# Patient Record
Sex: Female | Born: 1935
Health system: Southern US, Community
[De-identification: ages and names within clinical notes are randomized; demographics above are authoritative.]

## PROBLEM LIST (undated history)

## (undated) DIAGNOSIS — I6529 Occlusion and stenosis of unspecified carotid artery: Secondary | ICD-10-CM

## (undated) DIAGNOSIS — M1990 Unspecified osteoarthritis, unspecified site: Secondary | ICD-10-CM

## (undated) DIAGNOSIS — I639 Cerebral infarction, unspecified: Secondary | ICD-10-CM

## (undated) DIAGNOSIS — E785 Hyperlipidemia, unspecified: Secondary | ICD-10-CM

## (undated) DIAGNOSIS — I10 Essential (primary) hypertension: Secondary | ICD-10-CM

## (undated) DIAGNOSIS — M40204 Unspecified kyphosis, thoracic region: Secondary | ICD-10-CM

## (undated) DIAGNOSIS — F32A Depression, unspecified: Secondary | ICD-10-CM

## (undated) DIAGNOSIS — K219 Gastro-esophageal reflux disease without esophagitis: Secondary | ICD-10-CM

## (undated) DIAGNOSIS — F329 Major depressive disorder, single episode, unspecified: Secondary | ICD-10-CM

## (undated) HISTORY — DX: Gastro-esophageal reflux disease without esophagitis: K21.9

## (undated) HISTORY — DX: Unspecified osteoarthritis, unspecified site: M19.90

## (undated) HISTORY — DX: Essential (primary) hypertension: I10

## (undated) HISTORY — PX: CATARACT EXTRACTION W/ INTRAOCULAR LENS  IMPLANT, BILATERAL: SHX1307

## (undated) HISTORY — DX: Occlusion and stenosis of unspecified carotid artery: I65.29

## (undated) HISTORY — DX: Unspecified kyphosis, thoracic region: M40.204

## (undated) HISTORY — DX: Cerebral infarction, unspecified: I63.9

## (undated) HISTORY — DX: Major depressive disorder, single episode, unspecified: F32.9

## (undated) HISTORY — DX: Hyperlipidemia, unspecified: E78.5

## (undated) HISTORY — DX: Depression, unspecified: F32.A

---

## 2001-02-12 ENCOUNTER — Other Ambulatory Visit: Admission: RE | Admit: 2001-02-12 | Discharge: 2001-02-12 | Payer: Self-pay | Admitting: Family Medicine

## 2001-08-24 ENCOUNTER — Encounter: Payer: Self-pay | Admitting: Family Medicine

## 2001-08-24 ENCOUNTER — Encounter: Admission: RE | Admit: 2001-08-24 | Discharge: 2001-08-24 | Payer: Self-pay | Admitting: Family Medicine

## 2003-07-15 DIAGNOSIS — I6529 Occlusion and stenosis of unspecified carotid artery: Secondary | ICD-10-CM

## 2003-07-15 DIAGNOSIS — I639 Cerebral infarction, unspecified: Secondary | ICD-10-CM

## 2003-07-15 HISTORY — DX: Cerebral infarction, unspecified: I63.9

## 2003-07-15 HISTORY — DX: Occlusion and stenosis of unspecified carotid artery: I65.29

## 2003-08-08 ENCOUNTER — Encounter: Payer: Self-pay | Admitting: Emergency Medicine

## 2003-08-09 ENCOUNTER — Encounter (INDEPENDENT_AMBULATORY_CARE_PROVIDER_SITE_OTHER): Payer: Self-pay | Admitting: *Deleted

## 2003-08-09 ENCOUNTER — Inpatient Hospital Stay (HOSPITAL_COMMUNITY): Admission: EM | Admit: 2003-08-09 | Discharge: 2003-08-14 | Payer: Self-pay | Admitting: Internal Medicine

## 2005-05-29 ENCOUNTER — Other Ambulatory Visit: Admission: RE | Admit: 2005-05-29 | Discharge: 2005-05-29 | Payer: Self-pay | Admitting: Family Medicine

## 2005-06-25 ENCOUNTER — Encounter: Admission: RE | Admit: 2005-06-25 | Discharge: 2005-06-25 | Payer: Self-pay | Admitting: Family Medicine

## 2006-06-27 ENCOUNTER — Encounter: Admission: RE | Admit: 2006-06-27 | Discharge: 2006-06-27 | Payer: Self-pay | Admitting: Family Medicine

## 2006-09-17 ENCOUNTER — Ambulatory Visit: Payer: Self-pay | Admitting: Vascular Surgery

## 2007-01-07 ENCOUNTER — Encounter: Admission: RE | Admit: 2007-01-07 | Discharge: 2007-01-07 | Payer: Self-pay | Admitting: Family Medicine

## 2007-06-29 ENCOUNTER — Encounter: Admission: RE | Admit: 2007-06-29 | Discharge: 2007-06-29 | Payer: Self-pay | Admitting: Family Medicine

## 2007-09-23 ENCOUNTER — Ambulatory Visit: Payer: Self-pay | Admitting: Vascular Surgery

## 2008-06-29 ENCOUNTER — Encounter: Admission: RE | Admit: 2008-06-29 | Discharge: 2008-06-29 | Payer: Self-pay | Admitting: Family Medicine

## 2009-04-13 ENCOUNTER — Ambulatory Visit: Payer: Self-pay | Admitting: Vascular Surgery

## 2009-04-13 DIAGNOSIS — M1990 Unspecified osteoarthritis, unspecified site: Secondary | ICD-10-CM

## 2009-04-13 HISTORY — DX: Unspecified osteoarthritis, unspecified site: M19.90

## 2009-06-30 ENCOUNTER — Encounter: Admission: RE | Admit: 2009-06-30 | Discharge: 2009-06-30 | Payer: Self-pay | Admitting: Family Medicine

## 2010-03-28 ENCOUNTER — Other Ambulatory Visit (INDEPENDENT_AMBULATORY_CARE_PROVIDER_SITE_OTHER): Payer: BLUE CROSS/BLUE SHIELD

## 2010-03-28 ENCOUNTER — Ambulatory Visit (INDEPENDENT_AMBULATORY_CARE_PROVIDER_SITE_OTHER): Payer: BLUE CROSS/BLUE SHIELD | Admitting: Vascular Surgery

## 2010-03-28 DIAGNOSIS — I6529 Occlusion and stenosis of unspecified carotid artery: Secondary | ICD-10-CM

## 2010-03-29 NOTE — Assessment & Plan Note (Signed)
OFFICE VISIT  Brianna, Mcintosh DOB:  [birth year]                                       03/28/2010 ZOXWR#:[id number]  I saw patient in the office today for continued follow-up of her carotid disease.  She has a known right internal carotid artery occlusion.  We have been following the left carotid on a yearly basis for this reason. Since she was seen last in our office in March 2011, she has had no history of stroke, TIAs, expressive or receptive aphasia, or amaurosis fugax.  She had a stroke in 2005, which was when her right internal carotid artery occlusion was detected.  Of note, she has been on Coumadin and believes that she was placed on this when she was worked up for her stroke in 2005.  PAST MEDICAL HISTORY:  Significant for hypertension, hyperlipidemia, and a previous stroke, as described above.  She denies any history of diabetes, history of previous myocardial infarction, or history congestive heart failure.  SOCIAL HISTORY:  She is widowed.  She has 3 children.  She does not use tobacco.  REVIEW OF SYSTEMS:  CARDIOVASCULAR:  She has had no chest pain, chest pressure, palpitations or arrhythmias. PULMONARY:  She has had no productive cough, bronchitis, asthma or wheezing. MUSCULOSKELETAL:  She does have a history of arthritis and joint pain.  PHYSICAL EXAMINATION:  This is a pleasant 77 year old woman who appears her stated age.  Blood pressure is 123/68, heart rate is 49, saturation is 96%.  Lungs are clear bilaterally to auscultation without rales, rhonchi, or wheezing.  On cardiovascular exam, I do not detect any carotid bruits.  She has a regular rate and rhythm.  She has palpable femoral pulses with warm, well-perfused feet.  Abdomen is soft and nontender.  Musculoskeletal:  She has no major deformities or cyanosis. Neurologic:  She has no focal weakness or paresthesias.  I have independently interpreted her carotid duplex scan, which  shows that her left carotid is widely patent with no significant stenosis noted.  Left vertebral has normally directed flow.  The right was not studied, as she has a known right internal carotid artery occlusion.  Overall, I have reassured her that she has no evidence of carotid stenosis on the left.  Given the right carotid occlusion, I think we should continue to follow her duplex on a yearly basis.  I have ordered a follow-up carotid duplex scan in 1 year.  I will plan on seeing her back in 2 years unless there has been any change in her duplex next year.  With respect to her Coumadin, I personally feel that it would be safe to discontinue her Coumadin and start her on aspirin therapy.  I am not aware of who actually started her Coumadin initially after a stroke in 2005.  Certainly, there is some risk with continued Coumadin therapy, and I am not aware of any __________.    [name]. [name], M.D. Electronically Signed  CSD/MEDQ  D:  03/28/2010  T:  03/29/2010  Job:  [id number]  cc:   Lupita Raider, M.D.

## 2010-04-04 NOTE — Procedures (Unsigned)
CAROTID DUPLEX EXAM  INDICATION:  Follow up carotid artery disease.  HISTORY: Diabetes:  No. Cardiac:  No. Hypertension:  Yes. Smoking:  No. Previous Surgery:  No. CV History:  Patient is currently asymptomatic. Amaurosis Fugax No, Paresthesias No, Hemiparesis No.                                      RIGHT             LEFT Brachial systolic pressure: Brachial Doppler waveforms: Vertebral direction of flow:                          Antegrade DUPLEX VELOCITIES (cm/sec) CCA peak systolic                                     83 ECA peak systolic                                     98 ICA peak systolic                                     94 ICA end diastolic                                     29 PLAQUE MORPHOLOGY:                                    Heterogenous PLAQUE AMOUNT:                                        Mild-to-moderate PLAQUE LOCATION:                                      ICA, ECA, CCA  IMPRESSION:  Left side, no hemodynamically significant stenosis. Prominent left vertebral.  Study is stable compared to previous.  ___________________________________________ Di Kindle. Edilia Bo, M.D.  OD/MEDQ  D:  03/28/2010  T:  03/28/2010  Job:  [id number]

## 2010-05-29 NOTE — Consult Note (Signed)
NEW PATIENT CONSULTATION   [name], [name]  DOB:  1924/06/12                                       04/13/2009  ZOXWR#:[id number]   REASON FOR VISIT:  Follow up carotid artery occlusive disease.   PRIMARY CARE PHYSICIAN:  Dr. Lucita Ferrara.   The patient is an 77 year old Caucasian female who was last seen by Dr.  Waverly Ferrari on 01/11/2004 for continued followup of her carotid  artery disease.  She has known right internal carotid artery occlusion,  which was found during workup for a stroke in July 2005 when she  presented with dysarthria and facial droop.  She has had no residual  symptoms.  There was no significant left internal carotid artery  stenosis at that time.  We are continuing to follow her with  surveillance carotid Doppler studies to evaluate her left internal  carotid artery.  She continues to be asymptomatic.  She has been on  Coumadin since her stroke.   Her past medical history is significant for CVA in 2005 with known right  internal carotid artery occlusion, hypertension, depression, cataract  surgery, and hyperlipidemia.   Her medications are:  Fish Oil 1000 mg q.h.s., Lipitor 20 mg p.o.  q.h.s., warfarin 2 mg daily except 1-1/2 tablets on Sundays, calcium 600  mg with 400 units of vitamin D daily, metoprolol 100 mg q.a.m.,  clonazepam  5 mg 1/2-tablet p.o. daily, paroxetine 20 mg p.o. daily,  folic acid 1 mg p.o. daily, amlodipine 5 mg p.o. daily.   She is allergic to penicillin, sulfa, and codeine and has a sensitivity  to prednisone.   She denies any premature cardiovascular disease in her family.   Social history reveals that she is widowed with 2 children.  She lives  in [place].  She has never smoked and does not drink alcohol on a  regular basis.   REVIEW OF SYSTEMS:  Please refer to our [facility] encounter form.  This was  negative except with the exception of pain in her right knee with  walking which is due to  degenerative disk disease, which is followed by  Dr. Sherlean Foot.  She is currently getting silicone shots.  Otherwise, she  denies weight changes, chest pain, shortness of breath, blood in the  stools, dysuria, neurologic changes, visual changes, dysarthria,  weakness, bleeding problems, although as mentioned, she is on Coumadin,  or skin rashes.  She does have some stress incontinence and has had  problems with presumably yeast vaginitis.   A carotid duplex in our office today showed 1% to 39% stenosis of her  left internal carotid artery, which was stable from her previous exam.  Her peak internal carotid artery systolic pressure was 82 with an end-  diastolic of 24.  She has left external carotid artery stenosis and,  again, occlusion of her right internal carotid artery.   PHYSICAL EXAMINATION:  Blood pressure is 134/73, heart rate is 52,  respirations 18.  This is a well-nourished, pleasant female who appears  her stated age.  Head is normocephalic, atraumatic.  Pupils are equal,  round, reactive to light and accommodation.  Oral mucosa are pink and  moist.  She does wear glasses.  Sclera nonicteric.  Lung sounds were  clear throughout.  Heart had a regular rate and rhythm.  No murmurs were  auscultated.  No  carotid bruits were auscultated.  She had 2+ radial and  posterior tibial pulses bilaterally.  There is no significant peripheral  edema.  Abdomen is soft, nontender, nondistended.  Musculoskeletal  showed no major deformities.  Neurologic exam showed she is alert and  oriented x4.  Speech was clear.  She moved all extremities 5/5 and  symmetrical.  Skin showed no ulcerations.  No cervical lymphadenopathy  or goiter was appreciated.   Patient continues to do well from a neurologic standpoint.  Given that  her left internal carotid artery stenosis is in the 1% to 39% range  without significant change from 2009, we will plan to continue seeing  her on a yearly basis.  She can call  sooner if needed.  In the meantime,  she is been maintained on Coumadin therapy.   Jerold Coombe, PA   Di Kindle. Edilia Bo, M.D.  Electronically Signed   AWZ/MEDQ  D:  04/13/2009  T:  04/13/2009  Job:  [id number]   cc:   Dr. Lucita Ferrara

## 2010-05-29 NOTE — Procedures (Signed)
CAROTID DUPLEX EXAM   INDICATION:  Follow-up evaluation of known carotid artery disease.   HISTORY:  Diabetes:  No.  Cardiac:  No.  Hypertension:  Yes.  Smoking:  No.  Previous Surgery:  No.  CV History:  Patient had a stroke in July, 2002.  Previous duplex on  September 17, 2006 revealed an occluded right ICA and a 20-39% left ICA  stenosis.  Amaurosis Fugax No, Paresthesias No, Hemiparesis No.                                       RIGHT             LEFT  Brachial systolic pressure:         126               128  Brachial Doppler waveforms:         Triphasic         Triphasic  Vertebral direction of flow:        Antegrade         Antegrade  DUPLEX VELOCITIES (cm/sec)  CCA peak systolic                   58                72  ECA peak systolic                   117               245  ICA peak systolic                   Occluded          91  ICA end diastolic                   Occluded          32  PLAQUE MORPHOLOGY:                  Mixed             Calcified  PLAQUE AMOUNT:                      Severe            Mild  PLAQUE LOCATION:                    Throughout ICA    Proximal ICA   IMPRESSION:  1. Occluded right internal carotid artery.  2. 20-39% left internal carotid artery stenosis.  3. Left external carotid artery stenosis.  4. No significant change from previous study.   ___________________________________________  Janetta Hora Fields, MD   MC/MEDQ  D:  09/23/2007  T:  09/23/2007  Job:  [id number]

## 2010-05-29 NOTE — Procedures (Signed)
CAROTID DUPLEX EXAM   INDICATION:  Follow-up for known coronary artery disease.   HISTORY:  Diabetes:  No  Cardiac:  No  Hypertension:  Yes  Smoking:  No  Previous Surgery:  No  CV History:  Patient has a known right internal carotid artery  occlusion.  Patient had a stroke on July 16, 2003.  Patient is currently  on Coumadin.  Amaurosis Fugax No, Paresthesias No, Hemiparesis No                                       RIGHT             LEFT  Brachial systolic pressure:         138               138  Brachial Doppler waveforms:         Triphasic         Triphasic  Vertebral direction of flow:        Antegrade         Antegrade  DUPLEX VELOCITIES (cm/sec)  CCA peak systolic                   59                70  ECA peak systolic                   113               259  ICA peak systolic                   Occluded          99  ICA end diastolic                   Occluded          37  PLAQUE MORPHOLOGY:                  Mixed             Soft  PLAQUE AMOUNT:                      Severe            Mild  PLAQUE LOCATION:                    ICA               Proximal ICA   IMPRESSION:  Occluded right internal carotid artery.   There is 20-39% left internal carotid artery stenosis.   Left external  carotid artery stenosis.   No significant change since previous study performed on January 01, 2006.   ___________________________________________  Di Kindle. Edilia Bo, M.D.   MC/MEDQ  D:  09/17/2006  T:  09/17/2006  Job:  [id number]

## 2010-05-29 NOTE — Procedures (Signed)
CAROTID DUPLEX EXAM   INDICATION:  Carotid disease.   HISTORY:  Diabetes:  No.  Cardiac:  No.  Hypertension:  Yes.  Smoking:  No.  Previous Surgery:  No.  CV History:  Stroke in 2002, currently asymptomatic.  Amaurosis Fugax No, Paresthesias No, Hemiparesis No.                                       RIGHT             LEFT  Brachial systolic pressure:         142               140  Brachial Doppler waveforms:         Normal            Normal  Vertebral direction of flow:        Antegrade         Antegrade  DUPLEX VELOCITIES (cm/sec)  CCA peak systolic                   46                95  ECA peak systolic                   92                179  ICA peak systolic                   Occluded          82  ICA end diastolic                                     24  PLAQUE MORPHOLOGY:                  Mixed             Mixed  PLAQUE AMOUNT:                      Occlusive         Mild  PLAQUE LOCATION:                    ICA               ICA/ECA   IMPRESSION:  1. Known occlusion of the right internal carotid artery.  2. 1-39% stenosis of the left internal carotid artery.  3. Left external carotid artery stenosis noted.  4. No significant change noted when compared to the previous      examination of 09/23/2007.   ___________________________________________  Di Kindle. Edilia Bo, M.D.   CH/MEDQ  D:  04/13/2009  T:  04/14/2009  Job:  [id number]

## 2010-06-01 NOTE — Consult Note (Signed)
NAME:  [name], [name] NO.:  [id number]   MEDICAL RECORD NO.:  [id number]                   PATIENT TYPE:  EMS   LOCATION:  ED                                   FACILITY:  [facility]   PHYSICIAN:  Marlan Mcintosh, M.D.               DATE OF BIRTH:  1924-07-31   DATE OF CONSULTATION:  08/08/2003  DATE OF DISCHARGE:                                   CONSULTATION   HISTORY OF PRESENT ILLNESS:  [name] is a 77 year old, right-  handed, white female, born 1924/01/19, with a history of  hypertension and depression.  The patient has been treated with aspirin in  the past.  The patient comes to [facility] for evaluation of  problems with onset of fatigue yesterday afternoon with some slurred speech  noted by family members at around 9:30 yesterday evening.  The patient  continued to have slurred speech today, was also noted to have some left  facial droop, and was brought to the emergency room today for further  evaluation.  CT scan of the brain has been done, showing evidence of  subcortical bifrontal white matter changes, questionably acute.  The changes  are more prominent on the right than the left.  Neurology was asked to see  the patient for further evaluation.  The patient denies any numbness of the  extremities, gait disturbance, headache.   PAST MEDICAL HISTORY:  1. New onset of dysarthria, left facial droop as above.  2. Hypertension.  3. Hyperlipidemia.  4. Cataract surgery bilaterally.  5. History of depression.   CURRENT MEDICATIONS:  1. Paxil 20 mg daily.  2. Lopressor 100 mg daily.  3. Klonopin 0.5 mg 1/2 tab b.i.d.  4. Lipitor 10 mg daily.  5. Aspirin 81 mg daily.  6. ___________ 10 mg daily.  7. Vantin 200 mg 1 b.i.d. that was recently started for nasal congestion.   HABITS:  The patient dips snuff, does not smoke cigarettes, does not drink  alcohol.   ALLERGIES:  1. PENICILLIN.  2. SULFA DRUGS.  3.  CODEINE.  4. PREDNISONE.   SOCIAL HISTORY:  [place],  is widowed, has three children, one who died with cancer.  The patient  continues to work in [employer] at this time.   FAMILY MEDICAL HISTORY:  Mother died of an accidental death.  Father died  with an MI.  The patient has 7 brothers, sisters.  One brother died with an  MI.  One sister died with senile dementia of Alzheimer's type.  One sister  died with heart disease.  One sister may have had congestive heart failure.   REVIEW OF SYSTEMS:  Notable for no fevers, chills.  Patient does note some  shortness of breath, denies chest pain, neck pain, denies trouble  controlling bowels, bladder, nausea, vomiting, has not had any black-out  seizure events.   PHYSICAL EXAMINATION:  VITAL SIGNS:  Blood pressure 156/94, heart rate 76,  respiratory rate 20, temperature 99.7.  GENERAL:  This patient is a minimally to moderately obese white female, who  is alert, cooperative at the time of examination.  HEENT:  Head is atraumatic.  Eyes:  Pupils are equal, round, and reactive to  light.  Discs are flat bilaterally.  NECK:  Supple.  No carotid bruits noted.  RESPIRATORY:  Clear.  CARDIOVASCULAR:  Regular rate and rhythm, no obvious murmurs or rubs noted.  EXTREMITIES:  Without significant edema.  Trace edema at ankles is noted,  however.  NEUROLOGIC:  Cranial nerves as above.  Some slight left nasolabial fold  flattening is noted.  The patient has symmetric pinprick sensation of the  face, has full extraocular movements.  Visual fields are full.  Speech is  not aphasic, is minimally dysarthric.  Motor testing was 5/5 strength in all  fours.  Good symmetric motor and tone is noted throughout.  Sensory testing  is intact to pinprick, soft touch bilaterally, first sensation throughout.  The patient has good finger-to-nose-to-finger, toe-to-finger bilaterally.  Gait was not tested.  Deep tendon  reflexes symmetric throughout.  Toes are  neutral bilaterally.  No drift is seen.   LABORATORY VALUES:  Notable for white count of 6.5, hemoglobin 13.0,  hematocrit 37.8, MCV 96.1, platelets 210.  Sodium 140, potassium 4.4,  chloride 106, CO2 29, glucose 116, BUN 14, creatinine 1.2, calcium 9.1.  Urinalysis reveals specific gravity of 1.01356.5, negative protein, 3-6  white cells, 0-2 red cells.   CT of the head is as above.  EKG is pending.   IMPRESSION:  1. New onset of dysarthria, left facial droop.  2. Hypertension.   This patient does have some bifrontal white matter changes seen by CT scan,  right greater than left.  The patient likely sustained a small subcortical  cerebrovascular infarction, involving the right brain.  The patient has been  on low dose aspirin prior to this admission.   PLAN:  1. We recommend admission for further stroke evaluation.  2. MRI of the brain.  3. MRI angiogram of intracranial and extracranial vessels.  4. 2 D echocardiogram.  5. Continue aspirin for now, may decide to switch to Aggrenox depending on     the results of the above.  6. Would recommend a monitored bed during the admission.  Will follow the     patient's clinical course while in-house.                                               Marlan Mcintosh, M.D.    CKW/MEDQ  D:  08/08/2003  T:  08/08/2003  Job:  [id number]   cc:   Melissa L. Ladona Ridgel, MD  [address] [place], [place] [address]  Fax: [phone]

## 2010-06-01 NOTE — H&P (Signed)
NAMENOELANI, Mcintosh                        ACCOUNT NO.:  [id number]   MEDICAL RECORD NO.:  [id number]                   PATIENT TYPE:  EMS   LOCATION:  ED                                   FACILITY:  [facility]   PHYSICIAN:  Hollice Espy, M.D.            DATE OF BIRTH:  1924/11/28   DATE OF ADMISSION:  08/08/2003  DATE OF DISCHARGE:                                HISTORY & PHYSICAL   PRIMARY CARE PHYSICIAN:  Dr. Donia Guiles.   CONSULTANTS ON THIS CASE:  Dr. Anne Hahn of neurology.   CHIEF COMPLAINT:  Slurred speech and facial droop.   HISTORY OF PRESENT ILLNESS:  This is a 77 year old white female with a past  medical history of hypertension and hyperlipidemia who presents with  symptoms suspicious for an acute stroke.  The patient states that she is in  relatively good health with minimal complaints when last night her family  noted that the patient was starting to have some slurred speech and her face  was drooping on the left-hand side.  They became concerned.  The patient  went to bed, she woke up the next morning and her symptoms persisted.  The  family convinced her to come into the emergency room for further evaluation.  On ER exam the patient had a noted left facial droop and again according to  her family, had a slurred speech.  She did not experience any complaints of  confusion or weakness, nor did this present on any type of physical exam.  A  CT scan of the head was done which did not show any evidence of acute bleed,  but could not definitively rule out a right-sided CVA.  The patient's  symptoms remained the same, not improving or worsening.  Neurology, Dr.  Anne Hahn of the Stroke Service was consulted, evaluated the patient and felt  the patient did have a possible CVA.  He recommended the patient be admitted  for further workup including MRI./MRA, and 2D echocardiogram.  Currently the  patient states she is doing okay.  She denies any headaches or visual  changes.  She denies any dysphagia.  She denies any chest pain,  palpitations, shortness of breath, wheeze, cough, abdominal pain, hematuria,  dysuria, constipation, or diarrhea.  She denies any focal extremity  weakness.  She does state that she can feel that her speech is slightly off,  but her thought process appears to be normal.   PAST MEDICAL HISTORY:  1. Hypertension.  2. Hyperlipidemia.  3. History of bilateral cataract surgery.  4. History of depression.   MEDICATIONS:  1. Paxil 20 mg daily.  2. Lopressor 100 mg daily.  3. Klonopin 0.25 mg b.i.d.  4. Lipitor 10 mg daily.  5. Aspirin 81 mg daily.  6. Zyrtec 10 mg daily.   ALLERGIES:  THE PATIENT HAS AN ALLERGY TO PENICILLIN, CODEINE AND SULFA.  SHE ALSO STATES THAT PREDNISONE GIVES HER IRREGULAR  HEART BEAT.   SOCIAL HISTORY:  She denies any alcohol or drug use.  She denies any current  tobacco use.   FAMILY HISTORY:  Hypertension and heart disease.   PHYSICAL EXAMINATION:  VITAL SIGNS:  On admission temperature 99.7, heart  rate 76, blood pressure 156/94, respirations 20, O2 saturation is  95% on  room air.  GENERAL:  The patient is alert and oriented times three in no apparent  distress.  HEENT:  She was noted to have a left facial droop, but otherwise her cranial  nerves are normal.  NECK:  No carotid bruits.  HEART:  Regular rate and rhythm.  S1 and S2.  LUNGS:  Clear to auscultation bilaterally.  ABDOMEN:  Soft, nontender, nondistended, positive bowel sounds.  EXTREMITIES:  No clubbing, cyanosis, or edema.  She is noted to have normal  bilateral equal grip and flexion and extension both upper and lower  extremities approximately 5 to 5+, but no focal weakness on one side or the  other.  She has some decreased sensation in her lower extremities, but this  may be more subjective.  She has no evidence of any Babinski sign.  She has  full range of motion of all of her extremities.  She has no pronator drift.  No  evidence of any cerebellar dysfunction.   LABORATORY DATA:  White count is 6.5, H&H 13 and 38, MCV 96, platelet count  210.  She has no shift.  Sodium 140, potassium 4.4, chloride 106, bicarb 29,  BUN 14, creatinine 1.2, glucose 116, calcium 9.1.  Her urinalysis is noted  to have a small amount of hemoglobin and large amount of leukocyte esterase,  but few bacteria, 3 to 6 white cells, and many epithelial cells.  CT of the  head shows some chronic small vessel changes, no evidence of any acute  bleed, but cannot rule out a right CVA.   ASSESSMENT/PLAN:  1. Suspect acute cerebrovascular accident.  The patient with continued     symptoms from approximately 24 hours now of facial droop and slurred     speech.  Neurology, Dr. Anne Hahn from the Stroke Service has been consulted     and has already seen the patient.  Go ahead and check a 2D     echocardiogram, carotid Dopplers, MRI/MRA, and will add Plavix now, and     continue Lipitor, check a fasting lipid profile in the morning, as well     as a homocysteine level.  2. Hypertension.  Continue Lopressor.  3. Depression.  She is on Zyrtec.                                               Hollice Espy, M.D.    SKK/MEDQ  D:  08/08/2003  T:  08/09/2003  Job:  [id number]   cc:   Donia Guiles, M.D.  [address] [place]  [place] [address]  Fax: [phone]

## 2010-06-01 NOTE — Discharge Summary (Signed)
NAME:  [name] NO.:  [id number]   MEDICAL RECORD NO.:  [id number]                   PATIENT TYPE:  INP   LOCATION:  3742                                 FACILITY:  [facility]   PHYSICIAN:  Sherin Quarry, MD                   DATE OF BIRTH:  1924-03-13   DATE OF ADMISSION:  08/09/2003  DATE OF DISCHARGE:  08/14/2003                                 DISCHARGE SUMMARY   HISTORY OF PRESENT ILLNESS:  [name] is a 77 year old lady with a  past history of hypertension and hyperlipidemia who initially presented to  [facility] emergency room on August 08, 2003. Prior to her admission,  family noted that she was experiencing slurred speech and that her face was  drooping on the left side. These symptoms persisted about 24 hours before  she presented to the emergency room. In the emergency room, a CT scan of the  brain was performed, which showed no signs of acute hemorrhage. The patient  was seen in consultation in the emergency room by Dr. Anne Hahn of the  neurology service. He felt that the patient had probably experienced a right  temporal stroke and recommended admission as well as MRI and MRA of the  brain. Therefore, the patient was admitted by Dr. Rito Ehrlich.   PHYSICAL EXAMINATION:  GENERAL:  On physical examination her temperature was  99, heart rate was 76, blood pressure was 156/94.  HEENT:  Examination remarkable for a left central facial weakness.  CHEST:  Clear.  CARDIOVASCULAR:  Normal S1 and S2 without murmur, rub, or gallop.  ABDOMEN:  Benign. There were normal bowel sounds without masses or  tenderness.  NEUROLOGIC:  On testing, the patient was noted to have some flattening of  the left nasal labial fold. Speech was described as minimally dysarthric.  Motor strength was 5 out of 5 diffusely. Sensory examination was normal.   LABORATORY DATA:  Relevant studies included CBC which revealed a white count  of 7,300. Hemoglobin of 13.3.  Prothrombin time showed a INR of 1.1. A lipid  profile was obtained, which showed an HDL cholesterol of 30, LDL cholesterol  of 86. Homocysteine level was upper range of normal at 12.53.   An echocardiogram was obtained. This showed normal left ventricular ejection  fraction in the range of 55% to 65%. The cardiac function was otherwise  normal and no ventricular thrombi were identified. Electrocardiogram  revealed signs of left ventricular hypertrophy and was otherwise,  unremarkable.   Subsequent evaluation included MRI and MRA scan of the brain. These studies  revealed multiple areas of hyper-intensity in the right perisylvian region,  extending into the right centrum semiovale, in the right posterior frontal  region, most consistent with ischemia in the temporal portion of the brain  on the right. The MRA scan showed absent flow in the right internal carotid  with suggestion of decreased  caliber of the supraclinoid segment. These  findings were felt to be consistent, either with occlusion of the carotid or  very high grade stenosis. In light of this finding, it was recommended that  consultation be obtained with the vascular surgery service and the patient  was seen in consultation by Dr. Edilia Bo. He reviewed the MRA scan as well as  a duplex Doppler study. He concluded that the patient had right internal  carotid artery complete occlusion. For this reason, no surgical intervention  was recommended. No significant stenosis of the left side was identified but  given the appearance of her right carotid artery, he felt that the left  carotid should be re-evaluated with duplex Doppler studies in 6 months. Dr.  Edilia Bo discussed the patient's findings with Dr. Pearlean Brownie of the neurology  service and it was felt best, under these circumstances, to treat the  patient with Coumadin rather than Aggrenox. Therefore, the patient was  started on intravenous heparin and Coumadin protocol was  initiated. It took  several days for the Coumadin to reach the therapeutic state. During this  period of time, the patient's condition generally improved. Her speech  seemed to return entirely to normal. The left central facial weakness became  extremely subtle. By August 14, 2003, the INR was 2.5 and was felt reasonable  to discharge the patient at that time.   DISCHARGE DIAGNOSES:  1. Left central facial weakness and dysarthria, secondary to right middle     cerebral artery distribution stroke.  2. Complete occlusion of the right internal carotid artery.  3. Hypertension.  4. History of hyperlipidemia.  5. History of cataract surgery.  6. History of depression.   DISCHARGE MEDICATIONS:  The patient will continue her usual medicines, which  consist of the following:  1. Paxil 20 mg daily.  2. Lopressor 100 mg daily.  3. Klonopin 0.25 mg b.i.d.  4. Lipitor 10 mg daily.  5. Aspirin 81 mg daily.  6. Zyrtec 10 mg daily.  In addition, she will take:  1. Folic acid 1 mg daily, in light of the homocystine level.  2. She is also instructed to continue Coumadin. We will have her take 5 mg     of Coumadin on August 14, 2003, 2.5 mg of Coumadin on August 15, 2003 and     then report to Dr. Roselie Skinner office on August 16, 2003 for followup     prothrombin time determination. The patient was made aware of these     instructions.   CONDITION ON DISCHARGE:  Good.                                                Sherin Quarry, MD    SY/MEDQ  D:  08/14/2003  T:  08/14/2003  Job:  [id number]   cc:   Donia Guiles, M.D.  [address] [place]  [place] [address]  Fax: [phone]   Di Kindle. Edilia Bo, M.D.  [address]  [place]  [place] [address]   Pramod P. Pearlean Brownie, MD  Fax: [phone]

## 2010-06-01 NOTE — Consult Note (Signed)
NAMEELLAH, OTTE                        ACCOUNT NO.:  [id number]   MEDICAL RECORD NO.:  [id number]                   PATIENT TYPE:  INP   LOCATION:  3742                                 FACILITY:  [facility]   PHYSICIAN:  Di Kindle. Edilia Bo, M.D.        DATE OF BIRTH:  [birth year]   DATE OF CONSULTATION:  08/09/2003  DATE OF DISCHARGE:                                   CONSULTATION   REASON FOR CONSULTATION:  Possible right hemispheric TIA with right internal  carotid artery occlusion.   HISTORY:  This is a pleasant 77 year old woman who had been in her normal  state of health until 2 days ago when she developed the rather sudden onset  of a left facial droop which her daughter noticed.  Associated with this she  did have some problems expressing herself and this may have been related to  the facial droop.  She denied any upper extremity or lower extremity  weakness and she had no associated amaurosis fugax.  She denies any previous  history of stroke, TIAs, expressive or receptive aphasia, or amaurosis  fugax.   Workup this hospitalization has included a CT scan and MRI and also a duplex  scan was obtained.  Duplex scan showed an occluded right internal carotid  artery with no significant left carotid stenosis.  Both vertebral arteries  were patent.  Vascular surgery was consulted concerning the right internal  carotid artery occlusion.   PAST MEDICAL HISTORY:  Significant for hypertension and hyperlipidemia.  She  denies any history of diabetes, history of previous myocardial infarction,  or history of congestive heart failure.   SOCIAL HISTORY:  She is widowed.  She has one daughter.  She does not smoke  tobacco but does dip snuff.   FAMILY HISTORY:  She had a brother who had coronary artery disease in his  26s but she is unaware of any premature cardiovascular disease.   REVIEW OF SYSTEMS:  GENERAL:  She has had no recent weight loss, weight  gain, fever, or chills.   CARDIAC:  She has had no chest pain, chest  pressure, orthopnea, or palpitations.  PULMONARY:  She has had no history of  asthma, wheezing or COPD.  HEMATOLOGIC:  She has had no bleeding problems or  clotting disorders.  GI:  She has had no recent change in her bowel habits  and has no history of peptic ulcer disease.  GU:  She has had no dysuria or  frequency.  MUSCULOSKELETAL:  She does occasionally get leg cramps but does  not give any clear-cut history of claudication, wrist pain, or nonhealing  ulcers.  She has had no previous history of DVT.  ENT:  She does wear  corrective lenses.  She has also had cataract surgery.  SKIN:  She has had  no recent rashes.  The remainder of review of systems is unremarkable.   PHYSICAL EXAMINATION:  VITAL SIGNS:  Temperature is 98.2,  blood pressure is  147/84, heart rate 64.   CT scan of the head shows bilateral subcortical and periventricular white  matter lesions which were likely chronic.  There was no obvious evidence of  an acute right hemispheric stroke.  There is no intracranial bleed.  She did  have an MRA.  The results of this, however, are pending.  I did not see any  images of the extracranial cerebrovascular vessels.   IMPRESSION:  I have reviewed her duplex and it appears that she does have a  right internal carotid artery occlusion.  I have explained that given the  occlusion there are not any surgical options on the right side.  She does  have a significant amount of plaque within the internal carotid artery and  she may have had some plaque here for some time and gone on to occlude this  vessel.  The left side does not have any significant stenosis; however,  given that she has a right internal carotid artery occlusion I have  recommended that we check this again in 6 months and follow this.  If there  still is no significant stenosis in 6 months we will follow her on yearly  intervals.  I agree with anticoagulation as per neurology.   I did not think  it was necessary to perform a cerebral angiogram to confirm a right internal  carotid artery occlusion as the duplex is very sensitive.  I will await the  results of her MRA and MRI and then arrange for follow up in 6 months.                                               Di Kindle. Edilia Bo, M.D.    CSD/MEDQ  D:  08/09/2003  T:  08/09/2003  Job:  [id number]

## 2010-06-15 ENCOUNTER — Other Ambulatory Visit: Payer: Self-pay | Admitting: Family Medicine

## 2010-06-15 DIAGNOSIS — Z1231 Encounter for screening mammogram for malignant neoplasm of breast: Secondary | ICD-10-CM

## 2010-07-02 ENCOUNTER — Ambulatory Visit
Admission: RE | Admit: 2010-07-02 | Discharge: 2010-07-02 | Disposition: A | Discharge status: Home / Self Care | Payer: Medicare Other | Source: Ambulatory Visit | Attending: Family Medicine | Admitting: Family Medicine

## 2010-07-02 DIAGNOSIS — Z1231 Encounter for screening mammogram for malignant neoplasm of breast: Secondary | ICD-10-CM

## 2011-03-28 ENCOUNTER — Ambulatory Visit (INDEPENDENT_AMBULATORY_CARE_PROVIDER_SITE_OTHER): Payer: Medicare Other | Admitting: *Deleted

## 2011-03-28 DIAGNOSIS — I6529 Occlusion and stenosis of unspecified carotid artery: Secondary | ICD-10-CM

## 2011-04-03 ENCOUNTER — Other Ambulatory Visit: Payer: Self-pay | Admitting: *Deleted

## 2011-04-03 ENCOUNTER — Encounter: Payer: Self-pay | Admitting: Vascular Surgery

## 2011-04-03 DIAGNOSIS — I6529 Occlusion and stenosis of unspecified carotid artery: Secondary | ICD-10-CM

## 2011-04-03 NOTE — Procedures (Unsigned)
CAROTID DUPLEX EXAM  INDICATION:  Followup carotid artery disease.  HISTORY: Diabetes:  No Cardiac:  No Hypertension:  Yes Smoking:  No Previous Surgery:  No CV History:  Asymptomatic Amaurosis Fugax No, Paresthesias No, Hemiparesis No                                      RIGHT             LEFT Brachial systolic pressure:         150               150 Brachial Doppler waveforms:         Triphasic         Triphasic Vertebral direction of flow:        Antegrade         Antegrade DUPLEX VELOCITIES (cm/sec) CCA peak systolic                   64                116 ECA peak systolic                   106               173 ICA peak systolic                   Occluded          102 ICA end diastolic                   Occluded          30 PLAQUE MORPHOLOGY:                  Occluded          Mixed, irregular PLAQUE AMOUNT:                      Occluded          Moderate PLAQUE LOCATION:                    ICA               Bifurcation ICA and ECA  IMPRESSION: 1. Known occlusion of the right ICA. 2. 1 to 39% ICA stenosis on the left. 3. No significant change since prior study of 03/28/2010.  ___________________________________________ Di Kindle. Edilia Bo, M.D.  SS/MEDQ  D:  03/28/2011  T:  03/28/2011  Job:  [id number]

## 2011-06-07 ENCOUNTER — Other Ambulatory Visit: Payer: Self-pay | Admitting: Family Medicine

## 2011-06-07 DIAGNOSIS — Z1231 Encounter for screening mammogram for malignant neoplasm of breast: Secondary | ICD-10-CM

## 2011-07-03 ENCOUNTER — Ambulatory Visit
Admission: RE | Admit: 2011-07-03 | Discharge: 2011-07-03 | Disposition: A | Discharge status: Home / Self Care | Payer: Medicare Other | Source: Ambulatory Visit | Attending: Family Medicine | Admitting: Family Medicine

## 2011-07-03 DIAGNOSIS — Z1231 Encounter for screening mammogram for malignant neoplasm of breast: Secondary | ICD-10-CM

## 2012-01-13 ENCOUNTER — Encounter: Payer: Self-pay | Admitting: Neurosurgery

## 2012-03-18 ENCOUNTER — Other Ambulatory Visit: Payer: Medicare Other

## 2012-03-18 ENCOUNTER — Ambulatory Visit: Payer: Medicare Other | Admitting: Neurosurgery

## 2012-03-18 ENCOUNTER — Ambulatory Visit: Payer: Medicare Other | Admitting: Vascular Surgery

## 2012-04-23 ENCOUNTER — Ambulatory Visit: Payer: Medicare Other | Admitting: Neurosurgery

## 2012-04-23 ENCOUNTER — Other Ambulatory Visit: Payer: Medicare Other

## 2012-05-14 ENCOUNTER — Other Ambulatory Visit: Payer: Self-pay | Admitting: *Deleted

## 2012-05-14 DIAGNOSIS — Z48812 Encounter for surgical aftercare following surgery on the circulatory system: Secondary | ICD-10-CM

## 2012-05-26 ENCOUNTER — Encounter: Payer: Self-pay | Admitting: Vascular Surgery

## 2012-05-27 ENCOUNTER — Encounter: Payer: Self-pay | Admitting: Vascular Surgery

## 2012-05-27 ENCOUNTER — Other Ambulatory Visit (INDEPENDENT_AMBULATORY_CARE_PROVIDER_SITE_OTHER): Payer: Medicare Other | Admitting: *Deleted

## 2012-05-27 ENCOUNTER — Ambulatory Visit (INDEPENDENT_AMBULATORY_CARE_PROVIDER_SITE_OTHER): Payer: Medicare Other | Admitting: Vascular Surgery

## 2012-05-27 DIAGNOSIS — Z48812 Encounter for surgical aftercare following surgery on the circulatory system: Secondary | ICD-10-CM

## 2012-05-27 DIAGNOSIS — I6529 Occlusion and stenosis of unspecified carotid artery: Secondary | ICD-10-CM

## 2012-05-27 DIAGNOSIS — Z95828 Presence of other vascular implants and grafts: Secondary | ICD-10-CM | POA: Insufficient documentation

## 2012-05-27 NOTE — Progress Notes (Signed)
[facility]  Patient name: [name] MRN: [id number] DOB: 03-May-1924 Sex: female  REASON FOR VISIT: follow up of carotid disease.  HPI: [name] is a 79 y.o. female presents for a routine carotid duplex follow up. She has been asymptomatic. She denies any history of stroke, TIAs, expressive or receptive aphasia, or amaurosis fugax. There have been no significant changes in her medical history.   REVIEW OF SYSTEMS: Arly.Keller ] denotes positive finding; [  ] denotes negative finding  CARDIOVASCULAR:  [ ]  chest pain   [ ]  dyspnea on exertion    CONSTITUTIONAL:  [ ]  fever   [ ]  chills  PHYSICAL EXAM: Filed Vitals:   05/27/12 1310  BP: 127/61  Pulse: 47  Resp: 18  Height: 5\' 4"  (1.626 m)  Weight: 157 lb 11.2 oz (71.532 kg)   Body mass index is 27.06 kg/(m^2). GENERAL: The patient is a well-nourished female, in no acute distress. The vital signs are documented above. CARDIOVASCULAR: There is a regular rate and rhythm. I do not detect carotid bruits. PULMONARY: There is good air exchange bilaterally without wheezing or rales. NEURO: She has no focal weakness or paresthesias.  I have independently interpreted her carotid duplex scan which shows that she has a known right internal carotid artery occlusion. She has no significant stenosis on the left.  MEDICAL ISSUES: Given that she has been known right internal carotid artery occlusion I think we should continue to fall the left carotid closely. I've ordered a follow up  carotid duplex scan in 1 year and I'll see her back at that time. She knows to call sooner if she has problems. In the meantime she knows to continue taking her aspirin.  Lynnix Schoneman S [facility]per: [phone]

## 2012-06-11 ENCOUNTER — Other Ambulatory Visit: Payer: Self-pay

## 2012-06-11 DIAGNOSIS — Z1231 Encounter for screening mammogram for malignant neoplasm of breast: Secondary | ICD-10-CM

## 2012-07-09 ENCOUNTER — Ambulatory Visit
Admission: RE | Admit: 2012-07-09 | Discharge: 2012-07-09 | Disposition: A | Discharge status: Home / Self Care | Payer: Medicare Other | Source: Ambulatory Visit

## 2012-07-09 DIAGNOSIS — Z1231 Encounter for screening mammogram for malignant neoplasm of breast: Secondary | ICD-10-CM

## 2013-03-05 ENCOUNTER — Other Ambulatory Visit: Payer: Self-pay | Admitting: Vascular Surgery

## 2013-03-05 DIAGNOSIS — I6529 Occlusion and stenosis of unspecified carotid artery: Secondary | ICD-10-CM

## 2013-03-24 ENCOUNTER — Other Ambulatory Visit: Payer: Medicare Other

## 2013-03-24 ENCOUNTER — Ambulatory Visit: Payer: Medicare Other | Admitting: Neurosurgery

## 2013-06-01 ENCOUNTER — Encounter: Payer: Self-pay | Admitting: Vascular Surgery

## 2013-06-01 ENCOUNTER — Other Ambulatory Visit: Payer: Self-pay

## 2013-06-01 DIAGNOSIS — Z1231 Encounter for screening mammogram for malignant neoplasm of breast: Secondary | ICD-10-CM

## 2013-06-02 ENCOUNTER — Encounter: Payer: Self-pay | Admitting: Family

## 2013-06-02 ENCOUNTER — Ambulatory Visit (INDEPENDENT_AMBULATORY_CARE_PROVIDER_SITE_OTHER): Payer: Medicare Other | Admitting: Family

## 2013-06-02 ENCOUNTER — Ambulatory Visit (HOSPITAL_COMMUNITY)
Admission: RE | Admit: 2013-06-02 | Discharge: 2013-06-02 | Disposition: A | Discharge status: Home / Self Care | Payer: Medicare Other | Source: Ambulatory Visit | Attending: Vascular Surgery | Admitting: Vascular Surgery

## 2013-06-02 VITALS — BP 148/82 | HR 58 | Temp 97.2°F | Ht 64.0 in | Wt 164.0 lb

## 2013-06-02 DIAGNOSIS — I6529 Occlusion and stenosis of unspecified carotid artery: Secondary | ICD-10-CM | POA: Insufficient documentation

## 2013-06-02 NOTE — Patient Instructions (Addendum)
Stroke Prevention Some medical conditions and behaviors are associated with an increased chance of having a stroke. You may prevent a stroke by making healthy choices and managing medical conditions. HOW CAN I REDUCE MY RISK OF HAVING A STROKE?   Stay physically active. Get at least 30 minutes of activity on most or all days.  Do not smoke. It may also be helpful to avoid exposure to secondhand smoke.  Limit alcohol use. Moderate alcohol use is considered to be:  No more than 2 drinks per day for men.  No more than 1 drink per day for nonpregnant women.  Eat healthy foods. This involves  Eating 5 or more servings of fruits and vegetables a day.  Following a diet that addresses high blood pressure (hypertension), high cholesterol, diabetes, or obesity.  Manage your cholesterol levels.  A diet low in saturated fat, trans fat, and cholesterol and high in fiber may control cholesterol levels.  Take any prescribed medicines to control cholesterol as directed by your health care provider.  Manage your diabetes.  A controlled-carbohydrate, controlled-sugar diet is recommended to manage diabetes.  Take any prescribed medicines to control diabetes as directed by your health care provider.  Control your hypertension.  A low-salt (sodium), low-saturated fat, low-trans fat, and low-cholesterol diet is recommended to manage hypertension.  Take any prescribed medicines to control hypertension as directed by your health care provider.  Maintain a healthy weight.  A reduced-calorie, low-sodium, low-saturated fat, low-trans fat, low-cholesterol diet is recommended to manage weight.  Stop drug abuse.  Avoid taking birth control pills.  Talk to your health care provider about the risks of taking birth control pills if you are over 79 years old, smoke, get migraines, or have ever had a blood clot.  Get evaluated for sleep disorders (sleep apnea).  Talk to your health care provider about  getting a sleep evaluation if you snore a lot or have excessive sleepiness.  Take medicines as directed by your health care provider.  For some people, aspirin or blood thinners (anticoagulants) are helpful in reducing the risk of forming abnormal blood clots that can lead to stroke. If you have the irregular heart rhythm of atrial fibrillation, you should be on a blood thinner unless there is a good reason you cannot take them.  Understand all your medicine instructions.  Make sure that other other conditions (such as anemia or atherosclerosis) are addressed. SEEK IMMEDIATE MEDICAL CARE IF:   You have sudden weakness or numbness of the face, arm, or leg, especially on one side of the body.  Your face or eyelid droops to one side.  You have sudden confusion.  You have trouble speaking (aphasia) or understanding.  You have sudden trouble seeing in one or both eyes.  You have sudden trouble walking.  You have dizziness.  You have a loss of balance or coordination.  You have a sudden, severe headache with no known cause.  You have new chest pain or an irregular heartbeat. Any of these symptoms may represent a serious problem that is an emergency. Do not wait to see if the symptoms will go away. Get medical help at once. Call your local emergency services  (911 in [place] Do not drive yourself to the hospital. Document Released: 02/08/2004 Document Revised: 10/21/2012 Document Reviewed: 07/03/2012 [facility] Patient Information 2014 [place].   Smokeless Tobacco Use Smokeless tobacco is a loose, fine, or stringy tobacco. The tobacco is not smoked like a cigarette, but it is chewed or  held in the lips or cheeks. It resembles tea and comes from the leaves of the tobacco plant. Smokeless tobacco is usually flavored, sweetened, or processed in some way. Although smokeless tobacco is not smoked into the lungs, its chemicals are absorbed through the membranes in the mouth and into the  bloodstream. Its chemicals are also swallowed in saliva. The chemicals (nicotine and other toxins) are known to cause cancer. Smokeless tobacco contains up to 28 differentcarcinogens. CAUSES Nicotine is addictive. Smokeless tobacco contains nicotine, which is a stimulant. This stimulant can give you a "buzz" or altered state. People can become addicted to the feeling it delivers.  SYMPTOMS Smokeless tobacco can cause health problems, including:  Bad breath.  Yellow-brown teeth.  Mouth sores.  Cracking and bleeding lips.  Gum disease, gum recession, and bone loss around the teeth.  Tooth decay.  Increased or irregular heart rate.  High blood pressure, heart disease, and stroke.  Cancer of the mouth, lips, tongue, pancreas, voice box (larynx), esophagus, colon, and bladder.  Precancerous lesion of the soft tissues of the mouth (leukoplakia).  Loss of your sense of taste. TREATMENT Talk with your caregiver about ways you can quit. Quitting tobacco is a good decision for your health. Nicotine is addictive, but several options are available to help you quit including:  Nicotine replacement therapy (gum or patch).  Support and cessation programs. The following tips can help you quit:  Write down the reasons you would like to quit and look at them often.  Set a date during a low stress time to stop or cut back.  Ask family and friends for their support.  Remove all tobacco products from your home and work.  Replace the chewing tobacco with things like beef jerky, sunflower seeds, or shredded coconut.  Avoid situations that may make you want to chew tobacco.  Exercise and eat a healthy diet.  When you crave tobacco, distract yourself with drinking water, sugarless chewing gum, sugarless hard candy, exercising, or deep breathing. HOME CARE INSTRUCTIONS  See your dentist for regular oral health exams every 6 months.  Follow up with your caregiver as recommended. SEEK  MEDICAL CARE OR DENTAL CARE IF:  You have bleeding or cracking lips, gums, or cheeks.  You have mouth sores, discolorations, or pain.  You have tooth pain.  You develop persistent irritation, burning, or sores in the mouth.  You have pain, tenderness, or numbness in the mouth.  You develop a lump, bumpy patch, or hardened skin inside the mouth.  The color changes inside your mouth (gray, white, or red spots).  You have difficulty chewing, swallowing, or speaking. Document Released: 06/04/2010 Document Revised: 03/25/2011 Document Reviewed: 06/04/2010 [facility] Patient Information 2014 [place], [place].

## 2013-06-02 NOTE — Progress Notes (Signed)
Established Carotid Patient   History of Present Illness  [name] is a 80 y.o. female patient of Dr. Scot Dock who has a known right ICA occlusion and presents for a routine carotid duplex follow up.  Patient has not had previous carotid artery intervention. Had a stroke in 2005 as manifested by "not feeling right", was evaluated at [facility]. On 08/09/2003 MRI of the head: Multiple areas of hyperintensity in the right perisylvian region extending into the right centrum semiovale and also the right posterior frontal, anterior temporal regions most consistent with acute ischemia. Pt denies any TIA or stroke activity since then. Pt denies any cardiac problems.  Patient has Negative history of TIA or stroke symptom.  The patient denies amaurosis fugax or monocular blindness.  The patient  denies facial drooping.  Pt. denies hemiplegia.  The patient denies receptive or expressive aphasia.  Pt. denies extremity weakness. Pt has arthritis pain in her right knee. Pt denies claudication symptoms with walking, denies non healing wounds.   Pt denies New Medical or Surgical History.  Pt Diabetic: No Pt smoker: non-smoker, but her son states she is doing snuff, last use was in the last week  Pt meds include: Statin : Yes ASA: Yes Other anticoagulants/antiplatelets: no   Past Medical History  Diagnosis Date  . Depression   . Arthritis 04/13/09    Right knee  . Hypertension   . Hyperlipidemia   . Stroke July 2005    Mini  . Carotid artery occlusion July 2005    Social History History  Substance Use Topics  . Smoking status: Never Smoker   . Smokeless tobacco: Former Systems developer    Types: Snuff  . Alcohol Use: No    Family History Family History  Problem Relation Age of Onset  . Heart disease Brother   . Coronary artery disease Brother   . Heart disease Brother   . Heart disease Brother     Surgical History Past Surgical History  Procedure Laterality Date  . Cataract  extraction w/ intraocular lens  implant, bilateral      Allergies  Allergen Reactions  . Codeine   . Penicillins   . Prednisone   . Sulfa Antibiotics     Current Outpatient Prescriptions  Medication Sig Dispense Refill  . amLODipine (NORVASC) 5 MG tablet Take 5 mg by mouth daily.      Marland Kitchen aspirin 325 MG EC tablet Take 325 mg by mouth daily.      Marland Kitchen atorvastatin (LIPITOR) 20 MG tablet Take 20 mg by mouth daily.      . clonazePAM (KLONOPIN) 0.5 MG tablet Take 0.5 mg by mouth 2 (two) times daily as needed.      . loratadine (CLARITIN) 10 MG tablet Take 10 mg by mouth daily.      . metoprolol succinate (TOPROL-XL) 100 MG 24 hr tablet Take 100 mg by mouth daily. Take with or immediately following a meal.      . PARoxetine (PAXIL) 20 MG tablet Take 20 mg by mouth every morning.      . Calcium Carbonate-Vitamin D (CALTRATE 600+D PO) Take by mouth.      . fish oil-omega-3 fatty acids 1000 MG capsule Take 2 g by mouth daily.      . folic acid (FOLVITE) 1 MG tablet Take 1 mg by mouth daily.      Marland Kitchen HYDROcodone-acetaminophen (VICODIN) 5-500 MG per tablet Take 1 tablet by mouth every 6 (six) hours as needed.      Marland Kitchen  warfarin (COUMADIN) 2 MG tablet Take 2 mg by mouth daily.       No current facility-administered medications for this visit.    Review of Systems : See HPI for pertinent positives and negatives.  Physical Examination   Filed Vitals:   06/02/13 1246 06/02/13 1250  BP: 180/84 148/82  Pulse: 58 58  Temp: 97.2 F (36.2 C)   TempSrc: Oral   Height: 5\' 4"  (1.626 m)   Weight: 164 lb (74.39 kg)   SpO2: 98%    Body mass index is 28.14 kg/(m^2).   General: WDWN female in NAD GAIT: normal Eyes: PERRLA Pulmonary:  Non-labored, CTAB, Negative  Rales, Negative rhonchi, & Negative wheezing.  Cardiac: regular Rhythm, third heart sound heard ,  Negative detected murmur.  VASCULAR EXAM Carotid Bruits Left Right   Negative Negative   . Radial pulses are 2+ palpable and equal.                                                                                                                             LE Pulses LEFT RIGHT       POPLITEAL  not palpable   not palpable       POSTERIOR TIBIAL   palpable    palpable        DORSALIS PEDIS      ANTERIOR TIBIAL  palpable  not palpable     Gastrointestinal: soft, nontender, BS WNL, no r/g,  negative masses.  Musculoskeletal: Negative muscle atrophy/wasting. M/S 5/5 in UE's, 4/5 in LE's, Extremities without ischemic changes.  Neurologic: A&O X 3; Appropriate Affect ; SENSATION ;normal;  Speech is normal CN 2-12 intact, Pain and light touch intact in extremities, Motor exam as listed above.   Non-Invasive Vascular Imaging CAROTID DUPLEX 06/02/2013   CEREBROVASCULAR DUPLEX EVALUATION    INDICATION: Carotid artery disease     PREVIOUS INTERVENTION(S):     DUPLEX EXAM:     RIGHT  LEFT  Peak Systolic Velocities (cm/s) End Diastolic Velocities (cm/s) Plaque LOCATION Peak Systolic Velocities (cm/s) End Diastolic Velocities (cm/s) Plaque  43 0  CCA PROXIMAL 75 16   26 0  CCA MID 77 19   28 4   CCA DISTAL 58 19   62 6  ECA 96 0 HT  Occluded    ICA PROXIMAL 90 26 HT  Occluded    ICA MID 75 21   Occluded    ICA DISTAL 80 24      ICA / CCA Ratio (PSV) 1.16  Antegrade  Vertebral Flow Antegrade   712 Brachial Systolic Pressure (mmHg) 458  Triphasic  Brachial Artery Waveforms Triphasic     Plaque Morphology:  HM = Homogeneous, HT = Heterogeneous, CP = Calcific Plaque, SP = Smooth Plaque, IP = Irregular Plaque     ADDITIONAL FINDINGS:     IMPRESSION: Known occlusion of the right internal carotid artery. Left internal carotid artery velocities suggest a <40% stenosis.  Compared to the previous exam:  No significant change in comparison to the last exam on 05/27/2012.      Assessment: [name] is a 80 y.o. female who presents with asymptomatic known right ICA occlusion and minimal left ICA occlusion,  unchanged from a year ago.  She has a history of a TIA or mild stroke in 2005 as found on an MRI of the brain at that time, has had no further TIA or stroke activity.   Plan:  Patient was counseled re smokeless tobacco cessation. Follow-up in 1 year with Carotid Duplex scan.   I discussed in depth with the patient the nature of atherosclerosis, and emphasized the importance of maximal medical management including strict control of blood pressure, blood glucose, and lipid levels, obtaining regular exercise, and continued cessation of smoking.  The patient is aware that without maximal medical management the underlying atherosclerotic disease process will progress, limiting the benefit of any interventions. The patient was given information about stroke prevention and what symptoms should prompt the patient to seek immediate medical care. Thank you for allowing [place] to participate in this patient's care.  Clemon Chambers, RN, MSN, FNP-C [facility] Office: [phone]  Clinic Physician: Scot Dock  06/02/2013 12:55 PM

## 2013-06-02 NOTE — Addendum Note (Signed)
Addended by: Mena Goes on: 06/02/2013 05:07 PM   Modules accepted: Orders

## 2013-07-12 ENCOUNTER — Ambulatory Visit
Admission: RE | Admit: 2013-07-12 | Discharge: 2013-07-12 | Disposition: A | Discharge status: Home / Self Care | Payer: Medicare Other | Source: Ambulatory Visit

## 2013-07-12 ENCOUNTER — Ambulatory Visit: Payer: Medicare Other

## 2013-07-12 DIAGNOSIS — Z1231 Encounter for screening mammogram for malignant neoplasm of breast: Secondary | ICD-10-CM

## 2013-12-28 ENCOUNTER — Encounter: Payer: Medicare Other | Admitting: Family Medicine

## 2014-06-08 ENCOUNTER — Ambulatory Visit: Payer: Medicare Other | Admitting: Family

## 2014-06-08 ENCOUNTER — Other Ambulatory Visit (HOSPITAL_COMMUNITY): Payer: Medicare Other

## 2014-06-09 ENCOUNTER — Other Ambulatory Visit: Payer: Self-pay

## 2014-06-09 DIAGNOSIS — Z1231 Encounter for screening mammogram for malignant neoplasm of breast: Secondary | ICD-10-CM

## 2014-07-05 ENCOUNTER — Encounter: Payer: Self-pay | Admitting: Family

## 2014-07-07 ENCOUNTER — Other Ambulatory Visit: Payer: Self-pay | Admitting: Family

## 2014-07-07 DIAGNOSIS — I6523 Occlusion and stenosis of bilateral carotid arteries: Secondary | ICD-10-CM

## 2014-07-08 ENCOUNTER — Encounter: Payer: Self-pay | Admitting: Family

## 2014-07-08 ENCOUNTER — Ambulatory Visit (INDEPENDENT_AMBULATORY_CARE_PROVIDER_SITE_OTHER): Payer: Medicare Other | Admitting: Family

## 2014-07-08 ENCOUNTER — Ambulatory Visit (HOSPITAL_COMMUNITY)
Admission: RE | Admit: 2014-07-08 | Discharge: 2014-07-08 | Disposition: A | Discharge status: Home / Self Care | Payer: Medicare Other | Source: Ambulatory Visit | Attending: Family | Admitting: Family

## 2014-07-08 VITALS — BP 155/82 | HR 48 | Resp 16 | Ht 63.5 in | Wt 165.0 lb

## 2014-07-08 DIAGNOSIS — I6523 Occlusion and stenosis of bilateral carotid arteries: Secondary | ICD-10-CM | POA: Diagnosis present

## 2014-07-08 DIAGNOSIS — F17200 Nicotine dependence, unspecified, uncomplicated: Secondary | ICD-10-CM

## 2014-07-08 DIAGNOSIS — I6522 Occlusion and stenosis of left carotid artery: Secondary | ICD-10-CM

## 2014-07-08 DIAGNOSIS — Z720 Tobacco use: Secondary | ICD-10-CM

## 2014-07-08 DIAGNOSIS — I6521 Occlusion and stenosis of right carotid artery: Secondary | ICD-10-CM | POA: Diagnosis not present

## 2014-07-08 NOTE — Progress Notes (Signed)
Established Carotid Patient   History of Present Illness  [name] is a 81 y.o. female  patient of Dr. Scot Dock who has a known right ICA occlusion and presents for a routine carotid duplex follow up.  Patient has not had previous carotid artery intervention. Had a stroke in 2005 as manifested by "not feeling right" and slurred speech, was evaluated at [facility]. Daughter states pt has no residual neurological deficits.  On 08/09/2003 MRI of the head: Multiple areas of hyperintensity in the right perisylvian region extending into the right centrum semiovale and also the right posterior frontal, anterior temporal regions most consistent with acute ischemia. Pt denies any TIA or stroke activity since then. Pt denies any cardiac problems.  Pt has arthritis pain in her right knee. Pt denies claudication symptoms with walking, denies non healing wounds.  Pt states her blood pressure is higher in a medical setting.   Pt denies New Medical or Surgical History.  Pt Diabetic: No Pt smoker: non-smoker, but she uses oral snuff  Pt meds include: Statin : Yes ASA: Yes Other anticoagulants/antiplatelets: no, is NOT taking coumadin   Past Medical History  Diagnosis Date  . Depression   . Arthritis 04/13/09    Right knee  . Hypertension   . Hyperlipidemia   . Stroke July 2005    Mini  . Carotid artery occlusion July 2005    Social History History  Substance Use Topics  . Smoking status: Never Smoker   . Smokeless tobacco: Former Systems developer    Types: Snuff  . Alcohol Use: No    Family History Family History  Problem Relation Age of Onset  . Heart disease Brother   . Coronary artery disease Brother   . Heart disease Brother   . Heart disease Brother     Surgical History Past Surgical History  Procedure Laterality Date  . Cataract extraction w/ intraocular lens  implant, bilateral      Allergies  Allergen Reactions  . Codeine   . Penicillins   . Prednisone   . Sulfa  Antibiotics     Current Outpatient Prescriptions  Medication Sig Dispense Refill  . amLODipine (NORVASC) 5 MG tablet Take 5 mg by mouth daily.    Marland Kitchen aspirin 325 MG EC tablet Take 325 mg by mouth daily.    Marland Kitchen atorvastatin (LIPITOR) 20 MG tablet Take 20 mg by mouth daily.    . clonazePAM (KLONOPIN) 0.5 MG tablet Take 0.5 mg by mouth 2 (two) times daily as needed.    . loratadine (CLARITIN) 10 MG tablet Take 10 mg by mouth daily.    . metoprolol succinate (TOPROL-XL) 100 MG 24 hr tablet Take 100 mg by mouth daily. Take with or immediately following a meal.    . PARoxetine (PAXIL) 20 MG tablet Take 20 mg by mouth every morning.    . Calcium Carbonate-Vitamin D (CALTRATE 600+D PO) Take by mouth.    . fish oil-omega-3 fatty acids 1000 MG capsule Take 2 g by mouth daily.    . folic acid (FOLVITE) 1 MG tablet Take 1 mg by mouth daily.    Marland Kitchen HYDROcodone-acetaminophen (VICODIN) 5-500 MG per tablet Take 1 tablet by mouth every 6 (six) hours as needed.    . warfarin (COUMADIN) 2 MG tablet Take 2 mg by mouth daily.     No current facility-administered medications for this visit.    Review of Systems : See HPI for pertinent positives and negatives.  Physical Examination  Filed Vitals:  07/08/14 1539 07/08/14 1543 07/08/14 1544  BP: 138/77 150/80 155/82  Pulse: 53 47 48  Resp: 16    Height: 5' 3.5" (1.613 m)    Weight: 165 lb (74.844 kg)     Body mass index is 28.77 kg/(m^2).  General: WDWN female in NAD GAIT: normal Eyes: PERRLA Pulmonary: Non-labored, CTAB, Negative Rales, Negative rhonchi, & Negative wheezing.  Cardiac: regular Rhythm,  no detected murmur.  VASCULAR EXAM Carotid Bruits Left Right   Negative Negative  . Radial pulses are 2+ palpable and equal.      LE Pulses LEFT RIGHT   POPLITEAL not palpable  not palpable    POSTERIOR TIBIAL  palpable   palpable    DORSALIS PEDIS  ANTERIOR TIBIAL palpable  not palpable     Gastrointestinal: soft, nontender, BS WNL, no r/g, no palpable masses.  Musculoskeletal: Negative muscle atrophy/wasting. M/S 5/5 throughout, Extremities without ischemic changes.  Neurologic: A&O X 3; Appropriate Affect, Speech is normal CN 2-12 intact, Pain and light touch intact in extremities, Motor exam as listed above.         Non-Invasive Vascular Imaging CAROTID DUPLEX 07/08/2014   CEREBROVASCULAR DUPLEX EVALUATION    INDICATION: Carotid artery stenosis    PREVIOUS INTERVENTION(S): NA    DUPLEX EXAM:     RIGHT  LEFT  Peak Systolic Velocities (cm/s) End Diastolic Velocities (cm/s) Plaque LOCATION Peak Systolic Velocities (cm/s) End Diastolic Velocities (cm/s) Plaque  96 0  CCA PROXIMAL 113 21   52 0  CCA MID 104 16   32 4 HT CCA DISTAL 83 20 HT  85 8 HT ECA 258 0 HT  Occluded occluded occlusive ICA PROXIMAL 117 28 HT  82 6 HT ICA MID 112 28   Occluded occluded occlusive ICA DISTAL 95 25     ICA occlusion ICA / CCA Ratio (PSV) 1.13  Antegrade Vertebral Flow Antegrade  161 Brachial Systolic Pressure (mmHg) 096  Triphasic Brachial Artery Waveforms Triphasic    Plaque Morphology:  HM = Homogeneous, HT = Heterogeneous, CP = Calcific Plaque, SP = Smooth Plaque, IP = Irregular Plaque  ADDITIONAL FINDINGS: Right subclavian artery PSV127cm/sec; Left subclavian artery PSV137cm/sec    IMPRESSION: Right internal carotid artery presents with known occlusion, collateral present at the mid segment with minimal reconstitution of flow and distal segment remains occluded. Left internal carotid artery stenosis present in the less than 40% range. Left external carotid artery stenosis present, however may be overestimated due to compensatory flow.    Compared to the previous exam:  Essentially unchanged since previous study on 06/02/2013       Assessment: [name] is a 81 y.o. female who had a mild stroke in 2005, has no residual neurological deficits, has had no subsequent strokes or TIA's.  Today's carotid Duplex suggests right internal carotid artery with known occlusion, collateral present at the mid segment with minimal reconstitution of flow and distal segment remains occluded. Left internal carotid artery stenosis present in the less than 40% range. Left external carotid artery stenosis present, however may be overestimated due to compensatory flow. Essentially unchanged since previous study on 06/02/2013.    Plan: Follow-up in 1 year with Carotid Duplex.   I discussed in depth with the patient the nature of atherosclerosis, and emphasized the importance of maximal medical management including strict control of blood pressure, blood glucose, and lipid levels, obtaining regular exercise, and cessation of tobacco use.  The patient is aware that without maximal medical management the  underlying atherosclerotic disease process will progress, limiting the benefit of any interventions. The patient was given information about stroke prevention and what symptoms should prompt the patient to seek immediate medical care. Thank you for allowing [place] to participate in this patient's care.  Clemon Chambers, RN, MSN, FNP-C [facility] Office: [phone]  Clinic Physician: Bridgett Larsson on call  07/08/2014 4:01 PM

## 2014-07-08 NOTE — Patient Instructions (Addendum)
Stroke Prevention Some medical conditions and behaviors are associated with an increased chance of having a stroke. You may prevent a stroke by making healthy choices and managing medical conditions. HOW CAN I REDUCE MY RISK OF HAVING A STROKE?   Stay physically active. Get at least 30 minutes of activity on most or all days.  Do not smoke. It may also be helpful to avoid exposure to secondhand smoke.  Limit alcohol use. Moderate alcohol use is considered to be:  No more than 2 drinks per day for men.  No more than 1 drink per day for nonpregnant women.  Eat healthy foods. This involves:  Eating 5 or more servings of fruits and vegetables a day.  Making dietary changes that address high blood pressure (hypertension), high cholesterol, diabetes, or obesity.  Manage your cholesterol levels.  Making food choices that are high in fiber and low in saturated fat, trans fat, and cholesterol may control cholesterol levels.  Take any prescribed medicines to control cholesterol as directed by your health care provider.  Manage your diabetes.  Controlling your carbohydrate and sugar intake is recommended to manage diabetes.  Take any prescribed medicines to control diabetes as directed by your health care provider.  Control your hypertension.  Making food choices that are low in salt (sodium), saturated fat, trans fat, and cholesterol is recommended to manage hypertension.  Take any prescribed medicines to control hypertension as directed by your health care provider.  Maintain a healthy weight.  Reducing calorie intake and making food choices that are low in sodium, saturated fat, trans fat, and cholesterol are recommended to manage weight.  Stop drug abuse.  Avoid taking birth control pills.  Talk to your health care provider about the risks of taking birth control pills if you are over 35 years old, smoke, get migraines, or have ever had a blood clot.  Get evaluated for sleep  disorders (sleep apnea).  Talk to your health care provider about getting a sleep evaluation if you snore a lot or have excessive sleepiness.  Take medicines only as directed by your health care provider.  For some people, aspirin or blood thinners (anticoagulants) are helpful in reducing the risk of forming abnormal blood clots that can lead to stroke. If you have the irregular heart rhythm of atrial fibrillation, you should be on a blood thinner unless there is a good reason you cannot take them.  Understand all your medicine instructions.  Make sure that other conditions (such as anemia or atherosclerosis) are addressed. SEEK IMMEDIATE MEDICAL CARE IF:   You have sudden weakness or numbness of the face, arm, or leg, especially on one side of the body.  Your face or eyelid droops to one side.  You have sudden confusion.  You have trouble speaking (aphasia) or understanding.  You have sudden trouble seeing in one or both eyes.  You have sudden trouble walking.  You have dizziness.  You have a loss of balance or coordination.  You have a sudden, severe headache with no known cause.  You have new chest pain or an irregular heartbeat. Any of these symptoms may represent a serious problem that is an emergency. Do not wait to see if the symptoms will go away. Get medical help at once. Call your local emergency services (911 in [place] Do not drive yourself to the hospital. Document Released: 02/08/2004 Document Revised: 05/17/2013 Document Reviewed: 07/03/2012 [employer] Patient Information 2015 [employer]. This information is not intended to replace advice given   to you by your health care provider. Make sure you discuss any questions you have with your health care provider.    Smokeless Tobacco Use Smokeless tobacco is a loose, fine, or stringy tobacco. The tobacco is not smoked like a cigarette, but it is chewed or held in the lips or cheeks. It resembles tea and comes from the  leaves of the tobacco plant. Smokeless tobacco is usually flavored, sweetened, or processed in some way. Although smokeless tobacco is not smoked into the lungs, its chemicals are absorbed through the membranes in the mouth and into the bloodstream. Its chemicals are also swallowed in saliva. The chemicals (nicotine and other toxins) are known to cause cancer. Smokeless tobacco contains up to 28 differentcarcinogens. CAUSES Nicotine is addictive. Smokeless tobacco contains nicotine, which is a stimulant. This stimulant can give you a "buzz" or altered state. People can become addicted to the feeling it delivers.  SYMPTOMS Smokeless tobacco can cause health problems, including:  Bad breath.  Yellow-brown teeth.  Mouth sores.  Cracking and bleeding lips.  Gum disease, gum recession, and bone loss around the teeth.  Tooth decay.  Increased or irregular heart rate.  High blood pressure, heart disease, and stroke.  Cancer of the mouth, lips, tongue, pancreas, voice box (larynx), esophagus, colon, and bladder.  Precancerous lesion of the soft tissues of the mouth (leukoplakia).  Loss of your sense of taste. TREATMENT Talk with your caregiver about ways you can quit. Quitting tobacco is a good decision for your health. Nicotine is addictive, but several options are available to help you quit including:  Nicotine replacement therapy (gum or patch).  Support and cessation programs. The following tips can help you quit:  Write down the reasons you would like to quit and look at them often.  Set a date during a low stress time to stop or cut back.  Ask family and friends for their support.  Remove all tobacco products from your home and work.  Replace the chewing tobacco with things like beef jerky, sunflower seeds, or shredded coconut.  Avoid situations that may make you want to chew tobacco.  Exercise and eat a healthy diet.  When you crave tobacco, distract yourself with  drinking water, sugarless chewing gum, sugarless hard candy, exercising, or deep breathing. HOME CARE INSTRUCTIONS  See your dentist for regular oral health exams every 6 months.  Follow up with your caregiver as recommended. SEEK MEDICAL CARE OR DENTAL CARE IF:  You have bleeding or cracking lips, gums, or cheeks.  You have mouth sores, discolorations, or pain.  You have tooth pain.  You develop persistent irritation, burning, or sores in the mouth.  You have pain, tenderness, or numbness in the mouth.  You develop a lump, bumpy patch, or hardened skin inside the mouth.  The color changes inside your mouth (gray, white, or red spots).  You have difficulty chewing, swallowing, or speaking. Document Released: 06/04/2010 Document Revised: 03/25/2011 Document Reviewed: 06/04/2010 [facility] Patient Information 2015 [place], [place]. This information is not intended to replace advice given to you by your health care provider. Make sure you discuss any questions you have with your health care provider.

## 2014-07-08 NOTE — Progress Notes (Signed)
Filed Vitals:   07/08/14 1539 07/08/14 1543 07/08/14 1544  BP: 138/77 150/80 155/82  Pulse: 53 47 48  Resp: 16    Height: 5' 3.5" (1.613 m)    Weight: 165 lb (74.844 kg)     Body mass index is 28.77 kg/(m^2).

## 2014-07-14 ENCOUNTER — Ambulatory Visit: Payer: Self-pay

## 2014-07-21 ENCOUNTER — Ambulatory Visit: Payer: Medicare Other

## 2014-07-22 ENCOUNTER — Ambulatory Visit
Admission: RE | Admit: 2014-07-22 | Discharge: 2014-07-22 | Disposition: A | Discharge status: Home / Self Care | Payer: Medicare Other | Source: Ambulatory Visit

## 2014-07-22 DIAGNOSIS — Z1231 Encounter for screening mammogram for malignant neoplasm of breast: Secondary | ICD-10-CM

## 2014-07-25 ENCOUNTER — Other Ambulatory Visit: Payer: Self-pay | Admitting: Family Medicine

## 2014-07-25 DIAGNOSIS — R928 Other abnormal and inconclusive findings on diagnostic imaging of breast: Secondary | ICD-10-CM

## 2014-08-01 ENCOUNTER — Ambulatory Visit
Admission: RE | Admit: 2014-08-01 | Discharge: 2014-08-01 | Disposition: A | Discharge status: Home / Self Care | Payer: Medicare Other | Source: Ambulatory Visit | Attending: Family Medicine | Admitting: Family Medicine

## 2014-08-01 DIAGNOSIS — R928 Other abnormal and inconclusive findings on diagnostic imaging of breast: Secondary | ICD-10-CM

## 2014-11-16 ENCOUNTER — Other Ambulatory Visit: Payer: Self-pay | Admitting: Family Medicine

## 2014-11-16 DIAGNOSIS — R921 Mammographic calcification found on diagnostic imaging of breast: Secondary | ICD-10-CM

## 2015-02-02 ENCOUNTER — Ambulatory Visit
Admission: RE | Admit: 2015-02-02 | Discharge: 2015-02-02 | Disposition: A | Discharge status: Home / Self Care | Payer: PPO | Source: Ambulatory Visit | Attending: Family Medicine | Admitting: Family Medicine

## 2015-02-02 DIAGNOSIS — R920 Mammographic microcalcification found on diagnostic imaging of breast: Secondary | ICD-10-CM | POA: Diagnosis not present

## 2015-02-02 DIAGNOSIS — R921 Mammographic calcification found on diagnostic imaging of breast: Secondary | ICD-10-CM

## 2015-03-30 DIAGNOSIS — E782 Mixed hyperlipidemia: Secondary | ICD-10-CM | POA: Diagnosis not present

## 2015-03-30 DIAGNOSIS — R7303 Prediabetes: Secondary | ICD-10-CM | POA: Diagnosis not present

## 2015-03-30 DIAGNOSIS — I129 Hypertensive chronic kidney disease with stage 1 through stage 4 chronic kidney disease, or unspecified chronic kidney disease: Secondary | ICD-10-CM | POA: Diagnosis not present

## 2015-03-30 DIAGNOSIS — R7309 Other abnormal glucose: Secondary | ICD-10-CM | POA: Diagnosis not present

## 2015-03-30 DIAGNOSIS — N183 Chronic kidney disease, stage 3 (moderate): Secondary | ICD-10-CM | POA: Diagnosis not present

## 2015-03-30 DIAGNOSIS — R609 Edema, unspecified: Secondary | ICD-10-CM | POA: Diagnosis not present

## 2015-04-25 DIAGNOSIS — M542 Cervicalgia: Secondary | ICD-10-CM | POA: Diagnosis not present

## 2015-05-05 DIAGNOSIS — M542 Cervicalgia: Secondary | ICD-10-CM | POA: Insufficient documentation

## 2015-05-18 ENCOUNTER — Telehealth: Payer: Self-pay

## 2015-05-18 NOTE — Telephone Encounter (Signed)
Phone call from pt's daughter.  Reported approx. 1 mo. hx. of right neck pain, from behind the ear, down the neck, to the shoulder.  Stated the pt. has been evaluated by her PCP, and was felt to be related to a "kink" in her neck.  Stated the Orthopedic MD had evaluated her, took an xray, and felt that this was "muscular" in nature.  The Orthopedic MD recommended Physical Therapy, and if no improvement, would do an MRI.  Reported the pt. describes a "cracking in her (R) ear with turning her head."  Stated the pt. declined PT, due to the expense.  Daughter denied any neurological deficits.  Advised that this is unlikely related to her carotid disease, as it would not present with pain, as the pt. is describing.  Advised to encourage the pt. to try Physical Therapy to see if symptoms improve.  Daughter stated she will encourage this.

## 2015-05-29 DIAGNOSIS — M542 Cervicalgia: Secondary | ICD-10-CM | POA: Diagnosis not present

## 2015-05-31 DIAGNOSIS — M542 Cervicalgia: Secondary | ICD-10-CM | POA: Diagnosis not present

## 2015-06-06 DIAGNOSIS — M542 Cervicalgia: Secondary | ICD-10-CM | POA: Diagnosis not present

## 2015-06-08 DIAGNOSIS — M542 Cervicalgia: Secondary | ICD-10-CM | POA: Diagnosis not present

## 2015-06-26 ENCOUNTER — Other Ambulatory Visit: Payer: Self-pay | Admitting: Family Medicine

## 2015-06-26 DIAGNOSIS — N63 Unspecified lump in unspecified breast: Secondary | ICD-10-CM

## 2015-06-26 DIAGNOSIS — R921 Mammographic calcification found on diagnostic imaging of breast: Secondary | ICD-10-CM

## 2015-06-29 ENCOUNTER — Other Ambulatory Visit: Payer: Self-pay | Admitting: *Deleted

## 2015-06-29 DIAGNOSIS — I6523 Occlusion and stenosis of bilateral carotid arteries: Secondary | ICD-10-CM

## 2015-07-05 ENCOUNTER — Encounter: Payer: Self-pay | Admitting: Family

## 2015-07-06 ENCOUNTER — Ambulatory Visit (INDEPENDENT_AMBULATORY_CARE_PROVIDER_SITE_OTHER): Payer: PPO

## 2015-07-06 ENCOUNTER — Ambulatory Visit (INDEPENDENT_AMBULATORY_CARE_PROVIDER_SITE_OTHER): Payer: PPO | Admitting: Podiatry

## 2015-07-06 ENCOUNTER — Other Ambulatory Visit: Payer: Self-pay | Admitting: Podiatry

## 2015-07-06 ENCOUNTER — Encounter: Payer: Self-pay | Admitting: Podiatry

## 2015-07-06 VITALS — BP 112/50 | HR 58 | Resp 18

## 2015-07-06 DIAGNOSIS — R52 Pain, unspecified: Secondary | ICD-10-CM

## 2015-07-06 DIAGNOSIS — L84 Corns and callosities: Secondary | ICD-10-CM

## 2015-07-06 DIAGNOSIS — M216X2 Other acquired deformities of left foot: Secondary | ICD-10-CM | POA: Diagnosis not present

## 2015-07-06 NOTE — Progress Notes (Signed)
   Subjective:    Patient ID: [name], female    DOB: 1925-01-10, 82 y.o.   MRN: [id number]  HPI  82 year old female presents the also concerns of a "knot" on the ball of her left foot. He is unsure of how long his been present however it is sore and tender particular pressure and walking. No drainage or swelling or redness to the foot. No other complaints at this time. No recent treatment.   Review of Systems  All other systems reviewed and are negative.      Objective:   Physical Exam General: AAO x3, NAD  Dermatological:   Hyperkeratotic lesion left foot submetatarsal 3. Upon debridement there is no underlying ulceration, drainage or other signs of infection. There is no other open lesions or pre-ulcerative lesions are then applied at this time.    Vascular: Dorsalis Pedis artery and Posterior Tibial artery pedal pulses are 2/4 bilateral with immedate capillary fill time. Pedal hair growth present. No varicosities and no lower extremity edema present bilateral.  Neruologic: Grossly intact via light touch bilateral. Vibratory intact via tuning fork bilateral. Protective threshold with Semmes Wienstein monofilament intact to all pedal sites bilateral.  Musculoskeletal: Hammertoes present. There is prominence of the metatarsal heads plantarly with atrophy of the fat pad. No other areas of tenderness bilaterally.   Gait: Unassisted, Nonantalgic.      Assessment & Plan:  82 year old female with left foot symptomatic hyperkeratotic lesion; prominent metatarsal heads. -Treatment options discussed including all alternatives, risks, and complications -Etiology of symptoms were discussed -X-rays were obtained and reviewed with the patient. No evidence of acute fracture. -Hyperkeratotic lesion debrided 1 without complications or bleeding. Discussed reoccurrence. -Offloading pads were dispensed. -Follow-up if symptoms recur or worsen or any change. Call any questions or  concerns.  Celesta Gentile, DPM

## 2015-07-12 ENCOUNTER — Ambulatory Visit (INDEPENDENT_AMBULATORY_CARE_PROVIDER_SITE_OTHER): Payer: PPO | Admitting: Family

## 2015-07-12 ENCOUNTER — Encounter: Payer: Self-pay | Admitting: Family

## 2015-07-12 ENCOUNTER — Ambulatory Visit (HOSPITAL_COMMUNITY)
Admission: RE | Admit: 2015-07-12 | Discharge: 2015-07-12 | Disposition: A | Discharge status: Home / Self Care | Payer: PPO | Source: Ambulatory Visit | Attending: Family | Admitting: Family

## 2015-07-12 VITALS — BP 170/83 | HR 45 | Temp 97.3°F | Resp 16 | Ht 64.5 in | Wt 165.0 lb

## 2015-07-12 DIAGNOSIS — Z87891 Personal history of nicotine dependence: Secondary | ICD-10-CM | POA: Diagnosis not present

## 2015-07-12 DIAGNOSIS — I6521 Occlusion and stenosis of right carotid artery: Secondary | ICD-10-CM

## 2015-07-12 DIAGNOSIS — I6523 Occlusion and stenosis of bilateral carotid arteries: Secondary | ICD-10-CM | POA: Insufficient documentation

## 2015-07-12 DIAGNOSIS — I10 Essential (primary) hypertension: Secondary | ICD-10-CM | POA: Diagnosis not present

## 2015-07-12 DIAGNOSIS — F329 Major depressive disorder, single episode, unspecified: Secondary | ICD-10-CM | POA: Diagnosis not present

## 2015-07-12 DIAGNOSIS — E785 Hyperlipidemia, unspecified: Secondary | ICD-10-CM | POA: Diagnosis not present

## 2015-07-12 LAB — VAS US CAROTID
LEFT ECA DIAS: 15 cm/s
LEFT VERTEBRAL DIAS: -9 cm/s
Left CCA dist dias: 18 cm/s
Left CCA dist sys: 85 cm/s
Left CCA prox dias: 12 cm/s
Left CCA prox sys: 59 cm/s
Left ICA dist dias: -21 cm/s
Left ICA dist sys: -67 cm/s
Left ICA prox dias: -22 cm/s
Left ICA prox sys: -92 cm/s
RIGHT CCA MID DIAS: 2 cm/s
RIGHT ECA DIAS: 8 cm/s
Right CCA prox dias: 2 cm/s
Right CCA prox sys: 66 cm/s

## 2015-07-12 NOTE — Progress Notes (Signed)
Chief Complaint: Follow up Extracranial Carotid Artery Stenosis   History of Present Illness  [name] is a 82 y.o. female patient of Dr. Scot Dock who has a known right ICA occlusion and presents for a routine carotid duplex follow up.  Patient has not had previous carotid artery intervention. Had a stroke in 2005 as manifested by "not feeling right" and slurred speech, was evaluated at [facility]. Daughter states pt has no residual neurological deficits.  On 08/09/2003 MRI of the head: Multiple areas of hyperintensity in the right perisylvian region extending into the right centrum semiovale and also the right posterior frontal, anterior temporal regions most consistent with acute ischemia. Pt denies any TIA or stroke activity since then. Pt denies any cardiac problems.  Pt has arthritis pain in her right knee. Pt denies claudication symptoms with walking, denies non healing wounds.  Pt states she saw an orthopod for a crick in the right side of her neck, states physical therapy helped.  Pt Diabetic: No Pt smoker: non-smoker, but she used oral snuff until March 2017  Pt meds include: Statin : Yes ASA: Yes Other anticoagulants/antiplatelets: no, is NOT taking coumadin any longer    Past Medical History  Diagnosis Date  . Depression   . Arthritis 04/13/09    Right knee  . Hypertension   . Hyperlipidemia   . Stroke [facility]) July 2005    Mini  . Carotid artery occlusion July 2005    Social History Social History  Substance Use Topics  . Smoking status: Never Smoker   . Smokeless tobacco: Former Systems developer    Types: Snuff  . Alcohol Use: No    Family History Family History  Problem Relation Age of Onset  . Heart disease Brother   . Coronary artery disease Brother   . Heart disease Brother   . Heart disease Brother     Surgical History Past Surgical History  Procedure Laterality Date  . Cataract extraction w/ intraocular lens  implant, bilateral      Allergies   Allergen Reactions  . Penicillins   . Sulfa Antibiotics   . Codeine Other (See Comments)  . Penicillin G Rash  . Prednisone Palpitations  . Sulfamethoxazole-Trimethoprim Rash    Current Outpatient Prescriptions  Medication Sig Dispense Refill  . amLODipine (NORVASC) 5 MG tablet Take 5 mg by mouth daily.    Marland Kitchen aspirin 81 MG tablet Take 81 mg by mouth daily.    Marland Kitchen atorvastatin (LIPITOR) 20 MG tablet Take 20 mg by mouth daily.    . clonazePAM (KLONOPIN) 0.5 MG tablet Take 0.5 mg by mouth 2 (two) times daily as needed.    . metoprolol succinate (TOPROL-XL) 100 MG 24 hr tablet Take 100 mg by mouth daily. Take with or immediately following a meal.    . PARoxetine (PAXIL) 20 MG tablet Take 20 mg by mouth every morning.    Marland Kitchen aspirin 325 MG EC tablet Take 325 mg by mouth daily. Reported on 07/12/2015    . Calcium Carbonate-Vitamin D (CALTRATE 600+D PO) Take by mouth. Reported on 07/12/2015    . fish oil-omega-3 fatty acids 1000 MG capsule Take 2 g by mouth daily. Reported on [id number]    . folic acid (FOLVITE) 1 MG tablet Take 1 mg by mouth daily. Reported on 07/12/2015    . HYDROcodone-acetaminophen (VICODIN) 5-500 MG per tablet Take 1 tablet by mouth every 6 (six) hours as needed. Reported on 07/12/2015    . loratadine (CLARITIN) 10 MG  tablet Take 10 mg by mouth daily. Reported on 07/12/2015    . warfarin (COUMADIN) 2 MG tablet Take 2 mg by mouth daily. Reported on 07/12/2015     No current facility-administered medications for this visit.    Review of Systems : See HPI for pertinent positives and negatives.  Physical Examination  Filed Vitals:   07/12/15 1524 07/12/15 1527 07/12/15 1529  BP: 165/82 164/78 170/83  Pulse: 50 45 45  Temp: 97.3 F (36.3 C)    Resp: 16    Height: 5' 4.5" (1.638 m)    Weight: 165 lb (74.844 kg)    SpO2: 99%     Body mass index is 27.9 kg/(m^2).  General: WDWN female in NAD GAIT: normal Eyes: PERRLA Pulmonary: Respirations are non-labored,  CTAB Cardiac: regular rhythm,no detected murmur.  VASCULAR EXAM Carotid Bruits Left Right   Negative Negative  . Radial pulses are 2+ palpable and equal.      LE Pulses LEFT RIGHT   POPLITEAL not palpable  not palpable   POSTERIOR TIBIAL  palpable   palpable    DORSALIS PEDIS  ANTERIOR TIBIAL palpable  not palpable     Gastrointestinal: soft, nontender, BS WNL, no r/g, no palpable masses.  Musculoskeletal: No muscle atrophy/wasting. M/S 5/5 throughout, Extremities without ischemic changes. 1+ non pitting edema left ankle.  Neurologic: A&O X 3; Appropriate Affect, Speech is normal CN 2-12 intact, Pain and light touch intact in extremities, Motor exam as listed above.               Non-Invasive Vascular Imaging CAROTID DUPLEX 07/12/2015   CEREBROVASCULAR DUPLEX EVALUATION    INDICATION: Carotid artery disease    PREVIOUS INTERVENTION(S): NA    DUPLEX EXAM: Carotid duplex    RIGHT  LEFT  Peak Systolic Velocities (cm/s) End Diastolic Velocities (cm/s) Plaque LOCATION Peak Systolic Velocities (cm/s) End Diastolic Velocities (cm/s) Plaque  66 2  CCA PROXIMAL 59 12   28 2   CCA MID 64 14   37 2  CCA DISTAL 85 18 HT  73 8  ECA 158 15   Occluded - HT ICA PROXIMAL 92 22 HT  Occluded - HT ICA MID 81 21   Occluded - HT ICA DISTAL 67 21     NA ICA / CCA Ratio (PSV) 1.4  Antegrade Vertebral Flow Antegrade  - Brachial Systolic Pressure (mmHg) -  Triphasic Brachial Artery Waveforms Triphasic    Plaque Morphology:  HM = Homogeneous, HT = Heterogeneous, CP = Calcific Plaque, SP = Smooth Plaque, IP = Irregular Plaque     ADDITIONAL FINDINGS: Biphasic  subclavian arteries    IMPRESSION: 1. Known right internal carotid artery occlusion 2. Less than 40% left internal carotid artery  stenosis    Compared to the previous exam:  Unable to visualize right internal collateral per exam of 07/08/2014      Assessment: [name] is a 82 y.o. female who had a mild stroke in 2005, has no residual neurological deficits, has had no subsequent strokes or TIA's.  Today's carotid Duplex suggests right internal carotid artery with known occlusion and <40% left ICA stenosis. Unable to visualize right internal collateral per exam of 07/08/2014.   I advised pt and her daughter that pt should see her PCP as soon as possible re her elevated blood pressure. She denies chest pain, denies dyspnea, denies headache, denies feeling light headed.   Plan: Follow-up in 1 year with Carotid Duplex scan.   I discussed  in depth with the patient the nature of atherosclerosis, and emphasized the importance of maximal medical management including strict control of blood pressure, blood glucose, and lipid levels, obtaining regular exercise, and continued cessation of smoking.  The patient is aware that without maximal medical management the underlying atherosclerotic disease process will progress, limiting the benefit of any interventions. The patient was given information about stroke prevention and what symptoms should prompt the patient to seek immediate medical care. Thank you for allowing [place] to participate in this patient's care.  Clemon Chambers, RN, MSN, FNP-C [facility] [place] Office: [phone]  Clinic Physician: Scot Dock  07/12/2015 3:57 PM

## 2015-07-12 NOTE — Patient Instructions (Signed)
Stroke Prevention Some medical conditions and behaviors are associated with an increased chance of having a stroke. You may prevent a stroke by making healthy choices and managing medical conditions. HOW CAN I REDUCE MY RISK OF HAVING A STROKE?   Stay physically active. Get at least 30 minutes of activity on most or all days.  Do not smoke. It may also be helpful to avoid exposure to secondhand smoke.  Limit alcohol use. Moderate alcohol use is considered to be:  No more than 2 drinks per day for men.  No more than 1 drink per day for nonpregnant women.  Eat healthy foods. This involves:  Eating 5 or more servings of fruits and vegetables a day.  Making dietary changes that address high blood pressure (hypertension), high cholesterol, diabetes, or obesity.  Manage your cholesterol levels.  Making food choices that are high in fiber and low in saturated fat, trans fat, and cholesterol may control cholesterol levels.  Take any prescribed medicines to control cholesterol as directed by your health care provider.  Manage your diabetes.  Controlling your carbohydrate and sugar intake is recommended to manage diabetes.  Take any prescribed medicines to control diabetes as directed by your health care provider.  Control your hypertension.  Making food choices that are low in salt (sodium), saturated fat, trans fat, and cholesterol is recommended to manage hypertension.  Ask your health care provider if you need treatment to lower your blood pressure. Take any prescribed medicines to control hypertension as directed by your health care provider.  If you are 18-39 years of age, have your blood pressure checked every 3-5 years. If you are 40 years of age or older, have your blood pressure checked every year.  Maintain a healthy weight.  Reducing calorie intake and making food choices that are low in sodium, saturated fat, trans fat, and cholesterol are recommended to manage  weight.  Stop drug abuse.  Avoid taking birth control pills.  Talk to your health care provider about the risks of taking birth control pills if you are over 35 years old, smoke, get migraines, or have ever had a blood clot.  Get evaluated for sleep disorders (sleep apnea).  Talk to your health care provider about getting a sleep evaluation if you snore a lot or have excessive sleepiness.  Take medicines only as directed by your health care provider.  For some people, aspirin or blood thinners (anticoagulants) are helpful in reducing the risk of forming abnormal blood clots that can lead to stroke. If you have the irregular heart rhythm of atrial fibrillation, you should be on a blood thinner unless there is a good reason you cannot take them.  Understand all your medicine instructions.  Make sure that other conditions (such as anemia or atherosclerosis) are addressed. SEEK IMMEDIATE MEDICAL CARE IF:   You have sudden weakness or numbness of the face, arm, or leg, especially on one side of the body.  Your face or eyelid droops to one side.  You have sudden confusion.  You have trouble speaking (aphasia) or understanding.  You have sudden trouble seeing in one or both eyes.  You have sudden trouble walking.  You have dizziness.  You have a loss of balance or coordination.  You have a sudden, severe headache with no known cause.  You have new chest pain or an irregular heartbeat. Any of these symptoms may represent a serious problem that is an emergency. Do not wait to see if the symptoms will   go away. Get medical help at once. Call your local emergency services (911 in [place] Do not drive yourself to the hospital.   This information is not intended to replace advice given to you by your health care provider. Make sure you discuss any questions you have with your health care provider.   Document Released: 02/08/2004 Document Revised: 01/21/2014 Document Reviewed:  07/03/2012 Elsevier Interactive Patient Education 2016 Elsevier Inc.  

## 2015-07-12 NOTE — Progress Notes (Signed)
Filed Vitals:   07/12/15 1524 07/12/15 1527 07/12/15 1529  BP: 165/82 164/78 170/83  Pulse: 50 45 45  Temp: 97.3 F (36.3 C)    Resp: 16    Height: 5' 4.5" (1.638 m)    Weight: 165 lb (74.844 kg)    SpO2: 99%

## 2015-08-04 ENCOUNTER — Ambulatory Visit
Admission: RE | Admit: 2015-08-04 | Discharge: 2015-08-04 | Disposition: A | Discharge status: Home / Self Care | Payer: PPO | Source: Ambulatory Visit | Attending: Family Medicine | Admitting: Family Medicine

## 2015-08-04 DIAGNOSIS — R921 Mammographic calcification found on diagnostic imaging of breast: Secondary | ICD-10-CM

## 2015-10-05 DIAGNOSIS — I6522 Occlusion and stenosis of left carotid artery: Secondary | ICD-10-CM | POA: Diagnosis not present

## 2015-10-05 DIAGNOSIS — M1990 Unspecified osteoarthritis, unspecified site: Secondary | ICD-10-CM | POA: Diagnosis not present

## 2015-10-05 DIAGNOSIS — I129 Hypertensive chronic kidney disease with stage 1 through stage 4 chronic kidney disease, or unspecified chronic kidney disease: Secondary | ICD-10-CM | POA: Diagnosis not present

## 2015-10-05 DIAGNOSIS — Z0000 Encounter for general adult medical examination without abnormal findings: Secondary | ICD-10-CM | POA: Diagnosis not present

## 2015-10-05 DIAGNOSIS — F329 Major depressive disorder, single episode, unspecified: Secondary | ICD-10-CM | POA: Diagnosis not present

## 2015-10-05 DIAGNOSIS — R7303 Prediabetes: Secondary | ICD-10-CM | POA: Diagnosis not present

## 2015-10-05 DIAGNOSIS — M8580 Other specified disorders of bone density and structure, unspecified site: Secondary | ICD-10-CM | POA: Diagnosis not present

## 2015-10-05 DIAGNOSIS — E782 Mixed hyperlipidemia: Secondary | ICD-10-CM | POA: Diagnosis not present

## 2015-10-05 DIAGNOSIS — N183 Chronic kidney disease, stage 3 (moderate): Secondary | ICD-10-CM | POA: Diagnosis not present

## 2015-10-05 DIAGNOSIS — Z23 Encounter for immunization: Secondary | ICD-10-CM | POA: Diagnosis not present

## 2015-11-14 DIAGNOSIS — Z961 Presence of intraocular lens: Secondary | ICD-10-CM | POA: Diagnosis not present

## 2016-04-04 DIAGNOSIS — L570 Actinic keratosis: Secondary | ICD-10-CM | POA: Diagnosis not present

## 2016-04-04 DIAGNOSIS — E782 Mixed hyperlipidemia: Secondary | ICD-10-CM | POA: Diagnosis not present

## 2016-04-04 DIAGNOSIS — N183 Chronic kidney disease, stage 3 (moderate): Secondary | ICD-10-CM | POA: Diagnosis not present

## 2016-04-04 DIAGNOSIS — I129 Hypertensive chronic kidney disease with stage 1 through stage 4 chronic kidney disease, or unspecified chronic kidney disease: Secondary | ICD-10-CM | POA: Diagnosis not present

## 2016-04-04 DIAGNOSIS — R7303 Prediabetes: Secondary | ICD-10-CM | POA: Diagnosis not present

## 2016-04-16 ENCOUNTER — Other Ambulatory Visit: Payer: Self-pay | Admitting: Family Medicine

## 2016-04-16 DIAGNOSIS — Z09 Encounter for follow-up examination after completed treatment for conditions other than malignant neoplasm: Secondary | ICD-10-CM

## 2016-07-15 ENCOUNTER — Encounter: Payer: Self-pay | Admitting: Family

## 2016-07-22 ENCOUNTER — Other Ambulatory Visit: Payer: Self-pay

## 2016-07-22 DIAGNOSIS — I6523 Occlusion and stenosis of bilateral carotid arteries: Secondary | ICD-10-CM

## 2016-07-24 ENCOUNTER — Encounter: Payer: Self-pay | Admitting: Family

## 2016-07-24 ENCOUNTER — Ambulatory Visit (INDEPENDENT_AMBULATORY_CARE_PROVIDER_SITE_OTHER): Payer: PPO | Admitting: Family

## 2016-07-24 ENCOUNTER — Ambulatory Visit (HOSPITAL_COMMUNITY)
Admission: RE | Admit: 2016-07-24 | Discharge: 2016-07-24 | Disposition: A | Discharge status: Home / Self Care | Payer: PPO | Source: Ambulatory Visit | Attending: Vascular Surgery | Admitting: Vascular Surgery

## 2016-07-24 VITALS — BP 142/78 | HR 52 | Temp 96.6°F | Resp 18 | Ht 64.5 in

## 2016-07-24 DIAGNOSIS — Z87891 Personal history of nicotine dependence: Secondary | ICD-10-CM

## 2016-07-24 DIAGNOSIS — I6523 Occlusion and stenosis of bilateral carotid arteries: Secondary | ICD-10-CM | POA: Insufficient documentation

## 2016-07-24 DIAGNOSIS — I6521 Occlusion and stenosis of right carotid artery: Secondary | ICD-10-CM

## 2016-07-24 LAB — VAS US CAROTID
LEFT ECA DIAS: 12 cm/s
LEFT VERTEBRAL DIAS: -13 cm/s
Left CCA dist dias: -24 cm/s
Left CCA dist sys: -103 cm/s
Left CCA prox dias: 18 cm/s
Left CCA prox sys: 89 cm/s
Left ICA dist dias: -21 cm/s
Left ICA dist sys: -80 cm/s
Left ICA prox dias: -22 cm/s
Left ICA prox sys: -101 cm/s
RIGHT ECA DIAS: -9 cm/s
RIGHT VERTEBRAL DIAS: 11 cm/s
Right CCA prox dias: 2 cm/s
Right CCA prox sys: 90 cm/s

## 2016-07-24 NOTE — Progress Notes (Signed)
Chief Complaint: Follow up Extracranial Carotid Artery Stenosis   History of Present Illness  [name] is a 83 y.o. female whom Dr. Scot Dock has been monitoring, with known right ICA occlusion and presents for a routine carotid duplex follow up.  Patient has not had previous carotid artery intervention. She had a stroke in 2005 as manifested by "not feeling right", slurred speech, and left facial droop, was evaluated at [facility]. Daughter states pt has no residual neurological deficits and has had no subsequent stroke or TIA.   On 08/09/2003 MRI of the head: Multiple areas of hyperintensity in the right perisylvian region extending into the right centrum semiovale and also the right posterior frontal, anterior temporal regions most consistent with acute ischemia.  Pt denies any cardiac problems.  Pt has arthritis pain in her right knee. Pt denies claudication symptoms with walking, denies non healing wounds.   Pt Diabetic: No Pt smoker: non-smoker, but she used oral snuff until March 2017  Pt meds include: Statin : Yes ASA: Yes Other anticoagulants/antiplatelets: no, is NOT taking coumadin any longer    Past Medical History:  Diagnosis Date  . Arthritis 04/13/09   Right knee  . Carotid artery occlusion July 2005  . Depression   . Hyperlipidemia   . Hypertension   . Stroke [facility]) July 2005   Mini    Social History Social History  Substance Use Topics  . Smoking status: Never Smoker  . Smokeless tobacco: Former Systems developer    Types: Snuff  . Alcohol use No    Family History Family History  Problem Relation Age of Onset  . Heart disease Brother   . Coronary artery disease Brother   . Heart disease Brother   . Heart disease Brother     Surgical History Past Surgical History:  Procedure Laterality Date  . CATARACT EXTRACTION W/ INTRAOCULAR LENS  IMPLANT, BILATERAL      Allergies  Allergen Reactions  . Penicillins   . Sulfa Antibiotics   . Codeine  Other (See Comments)  . Penicillin G Rash  . Prednisone Palpitations  . Sulfamethoxazole-Trimethoprim Rash    Current Outpatient Prescriptions  Medication Sig Dispense Refill  . amLODipine (NORVASC) 5 MG tablet Take 5 mg by mouth daily.    Marland Kitchen aspirin 81 MG tablet Take 81 mg by mouth daily.    Marland Kitchen atorvastatin (LIPITOR) 20 MG tablet Take 20 mg by mouth daily.    . clonazePAM (KLONOPIN) 0.5 MG tablet Take 0.5 mg by mouth 2 (two) times daily as needed.    . metoprolol succinate (TOPROL-XL) 100 MG 24 hr tablet Take 100 mg by mouth daily. Take with or immediately following a meal.    . PARoxetine (PAXIL) 20 MG tablet Take 20 mg by mouth every morning.    Marland Kitchen aspirin 325 MG EC tablet Take 325 mg by mouth daily. Reported on 07/12/2015    . Calcium Carbonate-Vitamin D (CALTRATE 600+D PO) Take by mouth. Reported on 07/12/2015    . fish oil-omega-3 fatty acids 1000 MG capsule Take 2 g by mouth daily. Reported on 1/79/1505    . folic acid (FOLVITE) 1 MG tablet Take 1 mg by mouth daily. Reported on 07/12/2015    . HYDROcodone-acetaminophen (VICODIN) 5-500 MG per tablet Take 1 tablet by mouth every 6 (six) hours as needed. Reported on 07/12/2015    . loratadine (CLARITIN) 10 MG tablet Take 10 mg by mouth daily. Reported on 07/12/2015    . warfarin (COUMADIN) 2 MG  tablet Take 2 mg by mouth daily. Reported on 07/12/2015     No current facility-administered medications for this visit.     Review of Systems : See HPI for pertinent positives and negatives.  Physical Examination  Vitals:   07/24/16 1628 07/24/16 1629  BP: 133/78 (!) 142/78  Pulse: (!) 52   Resp: 18   Temp: (!) 96.6 F (35.9 C)   TempSrc: Oral   SpO2: 96%   Height: 5' 4.5" (1.638 m)    There is no height or weight on file to calculate BMI.  General: WDWN overweight female in NAD GAIT: normal Eyes: PERRLA Pulmonary: Respirations are non-labored, CTAB Cardiac: regular rhythm, bradycardic on a beta blocker, no detected  murmur.  VASCULAR EXAM Carotid Bruits Left Right   Negative Negative  . Radial pulses are 2+ palpable and equal.      LE Pulses LEFT RIGHT   POPLITEAL not palpable  not palpable   POSTERIOR TIBIAL  palpable   palpable    DORSALIS PEDIS  ANTERIOR TIBIAL palpable  not palpable     Gastrointestinal: soft, nontender, BS WNL, no r/g, no palpable masses.  Musculoskeletal: No muscle atrophy/wasting. M/S 5/5 throughout, Extremities without ischemic changes. 1+ non pitting edema left ankle.  Neurologic: A&O X 3; Appropriate Affect, Speech is normal CN 2-12 intact, Pain and light touch intact in extremities, Motor exam as listed above    Assessment: [name] is a 83 y.o. female who had a mild stroke in 2005, has no residual neurological deficits, has had no subsequent strokes or TIA's.   DATA Carotid Duplex (07/24/16): Right internal carotid artery with known occlusion. <40% left ICA stenosis. Left ECA stenosis. Bilateral vertebral artery flow is antegrade.  No significant change compared to the last exam on 07-12-15.   Plan:  Follow-up in 1 year with Carotid Duplex scan.   I discussed in depth with the patient the nature of atherosclerosis, and emphasized the importance of maximal medical management including strict control of blood pressure, blood glucose, and lipid levels, obtaining regular exercise, and continued cessation of smoking.  The patient is aware that without maximal medical management the underlying atherosclerotic disease process will progress, limiting the benefit of any interventions. The patient was given information about stroke prevention and what symptoms should prompt the patient to seek immediate medical care. Thank you for allowing [place] to participate in  this patient's care.  Clemon Chambers, RN, MSN, FNP-C [facility] [place] Office: [phone]  Clinic Physician: Dickson/Chen  07/24/16 4:45 PM

## 2016-07-24 NOTE — Patient Instructions (Signed)
Stroke Prevention Some medical conditions and behaviors are associated with an increased chance of having a stroke. You may prevent a stroke by making healthy choices and managing medical conditions. How can I reduce my risk of having a stroke?  Stay physically active. Get at least 30 minutes of activity on most or all days.  Do not smoke. It may also be helpful to avoid exposure to secondhand smoke.  Limit alcohol use. Moderate alcohol use is considered to be:  No more than 2 drinks per day for men.  No more than 1 drink per day for nonpregnant women.  Eat healthy foods. This involves:  Eating 5 or more servings of fruits and vegetables a day.  Making dietary changes that address high blood pressure (hypertension), high cholesterol, diabetes, or obesity.  Manage your cholesterol levels.  Making food choices that are high in fiber and low in saturated fat, trans fat, and cholesterol may control cholesterol levels.  Take any prescribed medicines to control cholesterol as directed by your health care provider.  Manage your diabetes.  Controlling your carbohydrate and sugar intake is recommended to manage diabetes.  Take any prescribed medicines to control diabetes as directed by your health care provider.  Control your hypertension.  Making food choices that are low in salt (sodium), saturated fat, trans fat, and cholesterol is recommended to manage hypertension.  Ask your health care provider if you need treatment to lower your blood pressure. Take any prescribed medicines to control hypertension as directed by your health care provider.  If you are 18-39 years of age, have your blood pressure checked every 3-5 years. If you are 40 years of age or older, have your blood pressure checked every year.  Maintain a healthy weight.  Reducing calorie intake and making food choices that are low in sodium, saturated fat, trans fat, and cholesterol are recommended to manage  weight.  Stop drug abuse.  Avoid taking birth control pills.  Talk to your health care provider about the risks of taking birth control pills if you are over 35 years old, smoke, get migraines, or have ever had a blood clot.  Get evaluated for sleep disorders (sleep apnea).  Talk to your health care provider about getting a sleep evaluation if you snore a lot or have excessive sleepiness.  Take medicines only as directed by your health care provider.  For some people, aspirin or blood thinners (anticoagulants) are helpful in reducing the risk of forming abnormal blood clots that can lead to stroke. If you have the irregular heart rhythm of atrial fibrillation, you should be on a blood thinner unless there is a good reason you cannot take them.  Understand all your medicine instructions.  Make sure that other conditions (such as anemia or atherosclerosis) are addressed. Get help right away if:  You have sudden weakness or numbness of the face, arm, or leg, especially on one side of the body.  Your face or eyelid droops to one side.  You have sudden confusion.  You have trouble speaking (aphasia) or understanding.  You have sudden trouble seeing in one or both eyes.  You have sudden trouble walking.  You have dizziness.  You have a loss of balance or coordination.  You have a sudden, severe headache with no known cause.  You have new chest pain or an irregular heartbeat. Any of these symptoms may represent a serious problem that is an emergency. Do not wait to see if the symptoms will go away.   Get medical help at once. Call your local emergency services (911 in [place] Do not drive yourself to the hospital. This information is not intended to replace advice given to you by your health care provider. Make sure you discuss any questions you have with your health care provider. Document Released: 02/08/2004 Document Revised: 06/08/2015 Document Reviewed: 07/03/2012 Elsevier  Interactive Patient Education  2017 Elsevier Inc.  

## 2016-07-26 NOTE — Addendum Note (Signed)
Addended by: Lianne Cure A on: 07/26/2016 03:12 PM   Modules accepted: Orders

## 2016-08-01 ENCOUNTER — Other Ambulatory Visit: Payer: Self-pay | Admitting: Family Medicine

## 2016-08-01 DIAGNOSIS — R921 Mammographic calcification found on diagnostic imaging of breast: Secondary | ICD-10-CM

## 2016-08-05 ENCOUNTER — Ambulatory Visit
Admission: RE | Admit: 2016-08-05 | Discharge: 2016-08-05 | Disposition: A | Discharge status: Home / Self Care | Payer: PPO | Source: Ambulatory Visit | Attending: Family Medicine | Admitting: Family Medicine

## 2016-08-05 DIAGNOSIS — R921 Mammographic calcification found on diagnostic imaging of breast: Secondary | ICD-10-CM | POA: Diagnosis not present

## 2016-08-28 DIAGNOSIS — R21 Rash and other nonspecific skin eruption: Secondary | ICD-10-CM | POA: Diagnosis not present

## 2016-10-10 DIAGNOSIS — E782 Mixed hyperlipidemia: Secondary | ICD-10-CM | POA: Diagnosis not present

## 2016-10-10 DIAGNOSIS — F329 Major depressive disorder, single episode, unspecified: Secondary | ICD-10-CM | POA: Diagnosis not present

## 2016-10-10 DIAGNOSIS — Z23 Encounter for immunization: Secondary | ICD-10-CM | POA: Diagnosis not present

## 2016-10-10 DIAGNOSIS — Z0000 Encounter for general adult medical examination without abnormal findings: Secondary | ICD-10-CM | POA: Diagnosis not present

## 2016-10-10 DIAGNOSIS — M8580 Other specified disorders of bone density and structure, unspecified site: Secondary | ICD-10-CM | POA: Diagnosis not present

## 2016-10-10 DIAGNOSIS — N183 Chronic kidney disease, stage 3 (moderate): Secondary | ICD-10-CM | POA: Diagnosis not present

## 2016-10-10 DIAGNOSIS — R7303 Prediabetes: Secondary | ICD-10-CM | POA: Diagnosis not present

## 2016-10-10 DIAGNOSIS — M1990 Unspecified osteoarthritis, unspecified site: Secondary | ICD-10-CM | POA: Diagnosis not present

## 2016-10-10 DIAGNOSIS — I129 Hypertensive chronic kidney disease with stage 1 through stage 4 chronic kidney disease, or unspecified chronic kidney disease: Secondary | ICD-10-CM | POA: Diagnosis not present

## 2016-10-10 DIAGNOSIS — I6522 Occlusion and stenosis of left carotid artery: Secondary | ICD-10-CM | POA: Diagnosis not present

## 2016-10-10 DIAGNOSIS — Z1389 Encounter for screening for other disorder: Secondary | ICD-10-CM | POA: Diagnosis not present

## 2017-01-16 DIAGNOSIS — H1851 Endothelial corneal dystrophy: Secondary | ICD-10-CM | POA: Diagnosis not present

## 2017-04-07 DIAGNOSIS — R197 Diarrhea, unspecified: Secondary | ICD-10-CM | POA: Diagnosis not present

## 2017-04-15 ENCOUNTER — Other Ambulatory Visit: Payer: Self-pay | Admitting: Family Medicine

## 2017-04-15 DIAGNOSIS — Z1231 Encounter for screening mammogram for malignant neoplasm of breast: Secondary | ICD-10-CM

## 2017-04-17 DIAGNOSIS — E782 Mixed hyperlipidemia: Secondary | ICD-10-CM | POA: Diagnosis not present

## 2017-04-17 DIAGNOSIS — R7303 Prediabetes: Secondary | ICD-10-CM | POA: Diagnosis not present

## 2017-04-17 DIAGNOSIS — I6522 Occlusion and stenosis of left carotid artery: Secondary | ICD-10-CM | POA: Diagnosis not present

## 2017-04-17 DIAGNOSIS — R197 Diarrhea, unspecified: Secondary | ICD-10-CM | POA: Diagnosis not present

## 2017-04-17 DIAGNOSIS — N183 Chronic kidney disease, stage 3 (moderate): Secondary | ICD-10-CM | POA: Diagnosis not present

## 2017-04-17 DIAGNOSIS — I129 Hypertensive chronic kidney disease with stage 1 through stage 4 chronic kidney disease, or unspecified chronic kidney disease: Secondary | ICD-10-CM | POA: Diagnosis not present

## 2017-04-24 DIAGNOSIS — R197 Diarrhea, unspecified: Secondary | ICD-10-CM | POA: Diagnosis not present

## 2017-07-30 ENCOUNTER — Ambulatory Visit: Payer: PPO | Admitting: Family

## 2017-07-30 ENCOUNTER — Encounter (HOSPITAL_COMMUNITY): Payer: PPO

## 2017-07-31 ENCOUNTER — Encounter (HOSPITAL_COMMUNITY): Payer: PPO

## 2017-07-31 ENCOUNTER — Ambulatory Visit: Payer: PPO | Admitting: Family

## 2017-08-05 ENCOUNTER — Encounter (HOSPITAL_COMMUNITY): Payer: PPO

## 2017-08-05 ENCOUNTER — Ambulatory Visit: Payer: PPO | Admitting: Family

## 2017-08-06 ENCOUNTER — Ambulatory Visit
Admission: RE | Admit: 2017-08-06 | Discharge: 2017-08-06 | Disposition: A | Discharge status: Home / Self Care | Payer: PPO | Source: Ambulatory Visit | Attending: Family Medicine | Admitting: Family Medicine

## 2017-08-06 DIAGNOSIS — Z1231 Encounter for screening mammogram for malignant neoplasm of breast: Secondary | ICD-10-CM

## 2017-08-11 ENCOUNTER — Encounter: Payer: Self-pay | Admitting: Family

## 2017-08-11 ENCOUNTER — Ambulatory Visit (INDEPENDENT_AMBULATORY_CARE_PROVIDER_SITE_OTHER): Payer: PPO | Admitting: Family

## 2017-08-11 ENCOUNTER — Ambulatory Visit (HOSPITAL_COMMUNITY)
Admission: RE | Admit: 2017-08-11 | Discharge: 2017-08-11 | Disposition: A | Discharge status: Home / Self Care | Payer: PPO | Source: Ambulatory Visit | Attending: Family | Admitting: Family

## 2017-08-11 VITALS — BP 129/71 | HR 53 | Temp 96.9°F | Resp 16 | Ht 64.5 in | Wt 152.0 lb

## 2017-08-11 DIAGNOSIS — Z8673 Personal history of transient ischemic attack (TIA), and cerebral infarction without residual deficits: Secondary | ICD-10-CM | POA: Insufficient documentation

## 2017-08-11 DIAGNOSIS — I6523 Occlusion and stenosis of bilateral carotid arteries: Secondary | ICD-10-CM | POA: Diagnosis not present

## 2017-08-11 DIAGNOSIS — I10 Essential (primary) hypertension: Secondary | ICD-10-CM | POA: Insufficient documentation

## 2017-08-11 DIAGNOSIS — Z87891 Personal history of nicotine dependence: Secondary | ICD-10-CM

## 2017-08-11 DIAGNOSIS — I6521 Occlusion and stenosis of right carotid artery: Secondary | ICD-10-CM

## 2017-08-11 DIAGNOSIS — Z87898 Personal history of other specified conditions: Secondary | ICD-10-CM

## 2017-08-11 DIAGNOSIS — E785 Hyperlipidemia, unspecified: Secondary | ICD-10-CM | POA: Insufficient documentation

## 2017-08-11 NOTE — Patient Instructions (Signed)

## 2017-08-11 NOTE — Progress Notes (Signed)
Chief Complaint: Follow up Extracranial Carotid Artery Stenosis   History of Present Illness  [name] is a 84 y.o. female whom Dr. Scot Mcintosh has been monitoring, with known right ICA occlusion and presents for a routine carotid duplex follow up.  Patient has not had previous carotid artery intervention. She had a stroke in 2005 as manifested by "not feeling right", slurred speech, and left facial droop, was evaluated at [name]. Daughter states pt has no residual neurological deficits and has had no subsequent stroke or TIA.   On 08/09/2003 MRI of the head: Multiple areas of hyperintensity in the right perisylvian region extending into the right centrum semiovale and also the right posterior frontal, anterior temporal regions most consistent with acute ischemia.  Pt denies any cardiac problems.  Pt has arthritis pain in her right knee. Pt denies claudication symptoms with walking, denies non healing wounds.  Daughter states pt has lost 12 pounds in the last 6 months. Pt denies post prandial abdominal pain.  Daughter states she will speak with pt's PCP re her low heart rate, feeling light headed, and her weight loss.    Pt Diabetic: No Pt smoker: non-smoker, but she used oral snuff until March 2017  Pt meds include: Statin : Yes ASA: Yes Other anticoagulants/antiplatelets: no, is NOT taking coumadin any longer     Past Medical History:  Diagnosis Date  . Arthritis 04/13/09   Right knee  . Carotid artery occlusion July 2005  . Depression   . Hyperlipidemia   . Hypertension   . Stroke [facility]) July 2005   Mini    Social History Social History   Tobacco Use  . Smoking status: Never Smoker  . Smokeless tobacco: Former Systems developer    Types: Snuff  Substance Use Topics  . Alcohol use: No    Alcohol/week: 0.0 oz  . Drug use: No    Family History Family History  Problem Relation Age of Onset  . Heart disease Brother   . Coronary artery disease Brother   . Heart  disease Brother   . Heart disease Brother     Surgical History Past Surgical History:  Procedure Laterality Date  . CATARACT EXTRACTION W/ INTRAOCULAR LENS  IMPLANT, BILATERAL      Allergies  Allergen Reactions  . Penicillins   . Sulfa Antibiotics   . Codeine Other (See Comments)  . Penicillin G Rash  . Prednisone Palpitations  . Sulfamethoxazole-Trimethoprim Rash    Current Outpatient Medications  Medication Sig Dispense Refill  . amLODipine (NORVASC) 5 MG tablet Take by mouth.    Marland Kitchen aspirin 81 MG tablet Take 81 mg by mouth daily.    Marland Kitchen atorvastatin (LIPITOR) 20 MG tablet Take by mouth.    . betamethasone valerate (VALISONE) 0.1 % cream Apply topically.    . Calcium Carbonate-Vitamin D (CALTRATE 600+D PO) Take by mouth. Reported on 07/12/2015    . clonazePAM (KLONOPIN) 0.5 MG tablet Take 0.5 mg by mouth 2 (two) times daily as needed.    . fish oil-omega-3 fatty acids 1000 MG capsule Take 2 g by mouth daily. Reported on 2/99/2426    . folic acid (FOLVITE) 1 MG tablet Take 1 mg by mouth daily. Reported on 07/12/2015    . HYDROcodone-acetaminophen (VICODIN) 5-500 MG per tablet Take 1 tablet by mouth every 6 (six) hours as needed. Reported on 07/12/2015    . loratadine (CLARITIN) 10 MG tablet Take 10 mg by mouth daily. Reported on 07/12/2015    .  metoprolol succinate (TOPROL-XL) 100 MG 24 hr tablet Take 100 mg by mouth daily. Take with or immediately following a meal.    . metoprolol tartrate (LOPRESSOR) 50 MG tablet     . PARoxetine (PAXIL) 20 MG tablet Take 20 mg by mouth every morning.    Marland Kitchen PARoxetine (PAXIL) 30 MG tablet Take by mouth.    . clonazePAM (KLONOPIN) 0.5 MG tablet Take by mouth.     No current facility-administered medications for this visit.     Review of Systems : See HPI for pertinent positives and negatives.  Physical Examination  Vitals:   08/11/17 1611 08/11/17 1616  BP: 139/77 129/71  Pulse: (!) 44 (!) 53  Resp: 16   Temp: (!) 96.9 F (36.1 C)    TempSrc: Oral   SpO2: 98%   Weight: 152 lb (68.9 kg)   Height: 5' 4.5" (1.638 m)    Body mass index is 25.69 kg/m.  General: WDWN female in NAD GAIT: normal Eyes: PERRLA HENT: No gross abnormalities.  Pulmonary:  Respirations are non-labored, good air movement in all fields, CTAB, no rales, rhonchi, or wheezing. Cardiac: regular rhythm, bradycardic (on a beta blocker), no detected murmur.  VASCULAR EXAM Carotid Bruits Right Left   Negative Negative     Abdominal aortic pulse is not palpable. Radial pulses are 2+ palpable and equal.                                                                                                                            LE Pulses Right Left       POPLITEAL  not palpable   not palpable       POSTERIOR TIBIAL  palpable   palpable        DORSALIS PEDIS      ANTERIOR TIBIAL  palpable   palpable     Gastrointestinal: soft, nontender, BS WNL, no r/g, no palpable masses. Musculoskeletal: no muscle atrophy/wasting. M/S 5/5 in upper extremities, 4/5 in lower extremities, extremities without ischemic changes.  Moderate thoracic kyphosis. 1+ non pitting soft edema left ankle. Skin: No rashes, no ulcers, no cellulitis.   Neurologic:  A&O X 3; appropriate affect, sensation is normal; speech is normal, CN 2-12 intact, pain and light touch intact in extremities, motor exam as listed above. Psychiatric: Normal thought content, mood appropriate to clinical situation.    Assessment: [name] is a 84 y.o. female who had a mild stroke in 2005, has no residual neurological deficits, has had no subsequent strokes or TIA's.  She has a known right ICA occlusion.  She does not have DM, she never smoked, but used smokless tobacco until 2017. She takes a daily statin and 81 mg ASA.   She remains quite active.    DATA Carotid Duplex (08-11-17): Right ICA: confirmed occlusion. Left ICA: 1-39% stenosis Left ECA: >50% stenosis Bilateral vertebral  artery flow is antegrade.  Bilateral subclavian artery waveforms are normal.  No  change compared to the exams on 07-12-15 and 07-24-16.     Plan: Follow-up in 1year with Carotid Duplex scan.    I discussed in depth with the patient the nature of atherosclerosis, and emphasized the importance of maximal medical management including strict control of blood pressure, blood glucose, and lipid levels, obtaining regular exercise, and continued cessation of smoking.  The patient is aware that without maximal medical management the underlying atherosclerotic disease process will progress, limiting the benefit of any interventions. The patient was given information about stroke prevention and what symptoms should prompt the patient to seek immediate medical care. Thank you for allowing [place] to participate in this patient's care.  Clemon Chambers, RN, MSN, FNP-C [facility] Office: [facility] Physician: Oneida Alar  08/11/17 4:32 PM

## 2017-08-28 DIAGNOSIS — E782 Mixed hyperlipidemia: Secondary | ICD-10-CM | POA: Diagnosis not present

## 2017-08-28 DIAGNOSIS — N183 Chronic kidney disease, stage 3 (moderate): Secondary | ICD-10-CM | POA: Diagnosis not present

## 2017-08-28 DIAGNOSIS — I129 Hypertensive chronic kidney disease with stage 1 through stage 4 chronic kidney disease, or unspecified chronic kidney disease: Secondary | ICD-10-CM | POA: Diagnosis not present

## 2017-08-28 DIAGNOSIS — I6522 Occlusion and stenosis of left carotid artery: Secondary | ICD-10-CM | POA: Diagnosis not present

## 2017-08-28 DIAGNOSIS — Z8673 Personal history of transient ischemic attack (TIA), and cerebral infarction without residual deficits: Secondary | ICD-10-CM | POA: Diagnosis not present

## 2017-10-31 DIAGNOSIS — Z23 Encounter for immunization: Secondary | ICD-10-CM | POA: Diagnosis not present

## 2017-12-29 DIAGNOSIS — M8580 Other specified disorders of bone density and structure, unspecified site: Secondary | ICD-10-CM | POA: Diagnosis not present

## 2017-12-29 DIAGNOSIS — R7303 Prediabetes: Secondary | ICD-10-CM | POA: Diagnosis not present

## 2017-12-29 DIAGNOSIS — M1990 Unspecified osteoarthritis, unspecified site: Secondary | ICD-10-CM | POA: Diagnosis not present

## 2017-12-29 DIAGNOSIS — M810 Age-related osteoporosis without current pathological fracture: Secondary | ICD-10-CM | POA: Diagnosis not present

## 2017-12-29 DIAGNOSIS — Z0000 Encounter for general adult medical examination without abnormal findings: Secondary | ICD-10-CM | POA: Diagnosis not present

## 2017-12-29 DIAGNOSIS — F329 Major depressive disorder, single episode, unspecified: Secondary | ICD-10-CM | POA: Diagnosis not present

## 2017-12-29 DIAGNOSIS — E782 Mixed hyperlipidemia: Secondary | ICD-10-CM | POA: Diagnosis not present

## 2017-12-29 DIAGNOSIS — N183 Chronic kidney disease, stage 3 (moderate): Secondary | ICD-10-CM | POA: Diagnosis not present

## 2017-12-29 DIAGNOSIS — Z1389 Encounter for screening for other disorder: Secondary | ICD-10-CM | POA: Diagnosis not present

## 2017-12-29 DIAGNOSIS — I129 Hypertensive chronic kidney disease with stage 1 through stage 4 chronic kidney disease, or unspecified chronic kidney disease: Secondary | ICD-10-CM | POA: Diagnosis not present

## 2017-12-29 DIAGNOSIS — R7309 Other abnormal glucose: Secondary | ICD-10-CM | POA: Diagnosis not present

## 2017-12-29 DIAGNOSIS — Z8673 Personal history of transient ischemic attack (TIA), and cerebral infarction without residual deficits: Secondary | ICD-10-CM | POA: Diagnosis not present

## 2017-12-29 DIAGNOSIS — I6522 Occlusion and stenosis of left carotid artery: Secondary | ICD-10-CM | POA: Diagnosis not present

## 2018-01-09 DIAGNOSIS — K1321 Leukoplakia of oral mucosa, including tongue: Secondary | ICD-10-CM | POA: Diagnosis not present

## 2018-01-29 DIAGNOSIS — J069 Acute upper respiratory infection, unspecified: Secondary | ICD-10-CM | POA: Diagnosis not present

## 2018-01-29 DIAGNOSIS — I129 Hypertensive chronic kidney disease with stage 1 through stage 4 chronic kidney disease, or unspecified chronic kidney disease: Secondary | ICD-10-CM | POA: Diagnosis not present

## 2018-02-02 DIAGNOSIS — K1329 Other disturbances of oral epithelium, including tongue: Secondary | ICD-10-CM | POA: Diagnosis not present

## 2018-02-09 DIAGNOSIS — K1329 Other disturbances of oral epithelium, including tongue: Secondary | ICD-10-CM | POA: Diagnosis not present

## 2018-02-09 DIAGNOSIS — K1321 Leukoplakia of oral mucosa, including tongue: Secondary | ICD-10-CM | POA: Diagnosis not present

## 2018-06-08 DIAGNOSIS — R1030 Lower abdominal pain, unspecified: Secondary | ICD-10-CM | POA: Diagnosis not present

## 2018-06-08 DIAGNOSIS — M25551 Pain in right hip: Secondary | ICD-10-CM | POA: Diagnosis not present

## 2018-06-08 DIAGNOSIS — M25572 Pain in left ankle and joints of left foot: Secondary | ICD-10-CM | POA: Diagnosis not present

## 2018-06-08 DIAGNOSIS — S61210A Laceration without foreign body of right index finger without damage to nail, initial encounter: Secondary | ICD-10-CM | POA: Diagnosis not present

## 2018-06-08 DIAGNOSIS — S9002XA Contusion of left ankle, initial encounter: Secondary | ICD-10-CM | POA: Diagnosis not present

## 2018-06-15 DIAGNOSIS — S61210D Laceration without foreign body of right index finger without damage to nail, subsequent encounter: Secondary | ICD-10-CM | POA: Diagnosis not present

## 2018-06-15 DIAGNOSIS — L03116 Cellulitis of left lower limb: Secondary | ICD-10-CM | POA: Diagnosis not present

## 2018-06-21 DIAGNOSIS — S61210D Laceration without foreign body of right index finger without damage to nail, subsequent encounter: Secondary | ICD-10-CM | POA: Diagnosis not present

## 2018-06-29 DIAGNOSIS — E782 Mixed hyperlipidemia: Secondary | ICD-10-CM | POA: Diagnosis not present

## 2018-06-29 DIAGNOSIS — S61219D Laceration without foreign body of unspecified finger without damage to nail, subsequent encounter: Secondary | ICD-10-CM | POA: Diagnosis not present

## 2018-06-29 DIAGNOSIS — I129 Hypertensive chronic kidney disease with stage 1 through stage 4 chronic kidney disease, or unspecified chronic kidney disease: Secondary | ICD-10-CM | POA: Diagnosis not present

## 2018-06-29 DIAGNOSIS — N183 Chronic kidney disease, stage 3 (moderate): Secondary | ICD-10-CM | POA: Diagnosis not present

## 2018-07-01 DIAGNOSIS — S61210D Laceration without foreign body of right index finger without damage to nail, subsequent encounter: Secondary | ICD-10-CM | POA: Diagnosis not present

## 2018-07-05 ENCOUNTER — Emergency Department (HOSPITAL_COMMUNITY): Payer: PPO

## 2018-07-05 ENCOUNTER — Emergency Department (HOSPITAL_COMMUNITY)
Admission: EM | Admit: 2018-07-05 | Discharge: 2018-07-05 | Disposition: A | Discharge status: Home / Self Care | Payer: PPO | Attending: Emergency Medicine | Admitting: Emergency Medicine

## 2018-07-05 ENCOUNTER — Other Ambulatory Visit: Payer: Self-pay

## 2018-07-05 ENCOUNTER — Encounter (HOSPITAL_COMMUNITY): Payer: Self-pay | Admitting: Emergency Medicine

## 2018-07-05 DIAGNOSIS — M25551 Pain in right hip: Secondary | ICD-10-CM

## 2018-07-05 DIAGNOSIS — Z79899 Other long term (current) drug therapy: Secondary | ICD-10-CM | POA: Insufficient documentation

## 2018-07-05 DIAGNOSIS — Z7982 Long term (current) use of aspirin: Secondary | ICD-10-CM | POA: Diagnosis not present

## 2018-07-05 DIAGNOSIS — M25561 Pain in right knee: Secondary | ICD-10-CM | POA: Diagnosis not present

## 2018-07-05 DIAGNOSIS — I10 Essential (primary) hypertension: Secondary | ICD-10-CM | POA: Diagnosis not present

## 2018-07-05 DIAGNOSIS — M1711 Unilateral primary osteoarthritis, right knee: Secondary | ICD-10-CM | POA: Insufficient documentation

## 2018-07-05 DIAGNOSIS — S8991XA Unspecified injury of right lower leg, initial encounter: Secondary | ICD-10-CM | POA: Diagnosis not present

## 2018-07-05 DIAGNOSIS — S79911A Unspecified injury of right hip, initial encounter: Secondary | ICD-10-CM | POA: Diagnosis not present

## 2018-07-05 MED ORDER — HYDROCODONE-ACETAMINOPHEN 5-325 MG PO TABS: 0.5000 | ORAL_TABLET | Freq: Four times a day (QID) | ORAL | 0 refills | Status: AC | PRN

## 2018-07-05 MED ORDER — HYDROCODONE-ACETAMINOPHEN 5-325 MG PO TABS
1.0000 | ORAL_TABLET | Freq: Once | ORAL | Status: AC
  Administered 2018-07-05: 1 via ORAL
  Filled 2018-07-05: qty 1

## 2018-07-05 NOTE — ED Provider Notes (Signed)
[facility] EMERGENCY DEPARTMENT Provider Note   CSN: [id number] Arrival date & time: 07/05/18  1227    History   Chief Complaint Chief Complaint  Patient presents with  . Knee Pain  . Leg Pain  . Arthritis    HPI [name] is a 85 y.o. female with past medical history of stroke, hypertension, hyperlipidemia, arthritis of the right knee, presenting to the emergency department with complaint of right hip pain that worsened last night.  Patient states she fell 1 month ago and had similar pain there.  She had been seen by her primary care, and states symptoms have been improving, however last night worsened once more.  Pain is located to the right groin which radiates down to her right knee, but is worse with standing up.  She has taken Tylenol for her symptoms, however without adequate relief today.  No new falls or injuries.  No numbness or weakness to the leg. She is ambulatory without assistance at baseline, and has been able to ambulate however it causes more pain.    The history is provided by the patient.    Past Medical History:  Diagnosis Date  . Arthritis 04/13/09   Right knee  . Carotid artery occlusion July 2005  . Depression   . Hyperlipidemia   . Hypertension   . Stroke [facility]) July 2005   Mini    Patient Active Problem List   Diagnosis Date Noted  . Occlusion and stenosis of carotid artery without mention of cerebral infarction 05/27/2012    Past Surgical History:  Procedure Laterality Date  . CATARACT EXTRACTION W/ INTRAOCULAR LENS  IMPLANT, BILATERAL       OB History   No obstetric history on file.      Home Medications    Prior to Admission medications   Medication Sig Start Date End Date Taking? Authorizing Provider  amLODipine (NORVASC) 5 MG tablet Take by mouth.    [provider]  aspirin 81 MG tablet Take 81 mg by mouth daily.    [provider]  atorvastatin (LIPITOR) 20 MG tablet Take by mouth.     [provider]  betamethasone valerate (VALISONE) 0.1 % cream Apply topically.    [provider]  Calcium Carbonate-Vitamin D (CALTRATE 600+D PO) Take by mouth. Reported on 07/12/2015    [provider]  clonazePAM (KLONOPIN) 0.5 MG tablet Take 0.5 mg by mouth 2 (two) times daily as needed.    [provider]  clonazePAM (KLONOPIN) 0.5 MG tablet Take by mouth.    [provider]  fish oil-omega-3 fatty acids 1000 MG capsule Take 2 g by mouth daily. Reported on 07/12/2015    [provider]  folic acid (FOLVITE) 1 MG tablet Take 1 mg by mouth daily. Reported on 07/12/2015    [provider]  HYDROcodone-acetaminophen (VICODIN) 5-500 MG per tablet Take 1 tablet by mouth every 6 (six) hours as needed. Reported on 07/12/2015    [provider]  loratadine (CLARITIN) 10 MG tablet Take 10 mg by mouth daily. Reported on 07/12/2015    [provider]  metoprolol succinate (TOPROL-XL) 100 MG 24 hr tablet Take 100 mg by mouth daily. Take with or immediately following a meal.    [provider]  metoprolol tartrate (LOPRESSOR) 50 MG tablet  06/12/17   [provider]  PARoxetine (PAXIL) 20 MG tablet Take 20 mg by mouth every morning.    [provider]  PARoxetine (PAXIL) 30 MG tablet Take by mouth.    [provider]    Family History Family History  Problem Relation Age of Onset  . Heart disease Brother   . Coronary artery disease Brother   . Heart disease Brother   . Heart disease Brother     Social History Social History   Tobacco Use  . Smoking status: Never Smoker  . Smokeless tobacco: Former Systems developer    Types: Snuff  Substance Use Topics  . Alcohol use: No    Alcohol/week: 0.0 standard drinks  . Drug use: No     Allergies   Penicillins, Sulfa antibiotics, Codeine, Penicillin g, Prednisone, and Sulfamethoxazole-trimethoprim   Review of Systems Review of Systems   Musculoskeletal: Positive for arthralgias.  All other systems reviewed and are negative.    Physical Exam Updated Vital Signs BP (!) 153/68   Pulse 67   Temp 98.2 F (36.8 C)   Resp 16   Ht 5\' 4"  (1.626 m)   Wt 70.8 kg   SpO2 98%   BMI 26.78 kg/m   Physical Exam Vitals signs and nursing note reviewed.  Constitutional:      General: She is not in acute distress.    Appearance: She is well-developed. She is not ill-appearing.  HENT:     Head: Normocephalic and atraumatic.  Eyes:     Conjunctiva/sclera: Conjunctivae normal.  Cardiovascular:     Rate and Rhythm: Normal rate and regular rhythm.  Pulmonary:     Effort: Pulmonary effort is normal. No respiratory distress.     Breath sounds: Normal breath sounds.  Abdominal:     General: Bowel sounds are normal.     Palpations: Abdomen is soft.     Tenderness: There is no abdominal tenderness.  Musculoskeletal:     Comments: There is no tenderness to the right hip or groin.  No tenderness to the right knee.  No obvious deformity.  Internal and external rotation of the hip causes some pain to the lateral aspect of the right hip.  No swelling.  Normal sensation.  Strong dorsi and plantar flexion bilaterally.  Intact PT pulses.  Skin:    General: Skin is warm.  Neurological:     Mental Status: She is alert.  Psychiatric:        Behavior: Behavior normal.      ED Treatments / Results  Labs (all labs ordered are listed, but only abnormal results are displayed) Labs Reviewed - No data to display  EKG None  Radiology Dg Knee Complete 4 Views Right  Result Date: 07/05/2018 CLINICAL DATA:  Fall a few weeks ago with persistent right knee pain. EXAM: RIGHT KNEE - COMPLETE 4+ VIEW COMPARISON:  None. FINDINGS: Mild tricompartmental osteoarthritis worse over the medial compartment and patellofemoral joints. No significant joint effusion. No evidence of acute fracture or dislocation. IMPRESSION: No acute findings. Mild  tricompartmental osteoarthritis. Electronically Signed   By: Marin Olp M.D.   On: 07/05/2018 14:26   Dg Hip Unilat With Pelvis 2-3 Views Right  Result Date: 07/05/2018 CLINICAL DATA:  Fall a few weeks ago with persistent right hip and knee pain. EXAM: DG HIP (WITH OR WITHOUT PELVIS) 2-3V RIGHT COMPARISON:  06/08/2018 FINDINGS: Moderate degenerative change of the hips right worse than left. There is moderate axial joint space loss of the right hip with prominent collar osteophyte formation. No definite acute fracture or dislocation. No change from the prior exam. IMPRESSION: Significant degenerative changes of the  right hip and into lesser extent the left hip. No definite fracture or dislocation. Consider CT or MRI for further evaluation to exclude occult fracture due to persistent symptoms. Electronically Signed   By: Marin Olp M.D.   On: 07/05/2018 14:25    Procedures Procedures (including critical care time)  Medications Ordered in ED Medications  HYDROcodone-acetaminophen (NORCO/VICODIN) 5-325 MG per tablet 1 tablet (1 tablet Oral Given 07/05/18 1429)     Initial Impression / Assessment and Plan / ED Course  I have reviewed the triage vital signs and the nursing notes.  Pertinent labs & imaging results that were available during my care of the patient were reviewed by me and considered in my medical decision making (see chart for details).  Clinical Course as of Jul 04 1609  Sun Jul 05, 2018  1500 Patient reports improvement after Norco.  Agreeable to plan for CT.   [JR]    Clinical Course User Index [JR] Robinson, [place] N, PA-C       Patient presenting with right hip pain after a fall 1 month ago.  States pain had been improving, however worsened again yesterday without new injury.  Pain to the right groin and lateral hip that radiates down to the knee.  No numbness or weakness.  No deformities on exam, no neuro deficits.  Discussed with Dr Francia Greaves, recommends norco for  pain.  X-ray obtained revealing significant degenerative changes in the hip. Given persistence of symptoms after fall 1 month ago, will proceed with CT to evaluate for occult fracture. Pain significantly improved with hydrocodone. If CT neg for fracture, consider small dose norco for pain at discharge and close outpatient follow up. Care assumed by Dr. Myriam Jacobson at shift change.  Final Clinical Impressions(s) / ED Diagnoses   Final diagnoses:  None    ED Discharge Orders    None       Robinson, [place] N, PA-C 07/05/18 1613    Valarie Merino, MD 07/06/18 [phone]

## 2018-07-05 NOTE — ED Provider Notes (Signed)
  Physical Exam  BP (!) 153/68   Pulse 67   Temp 98.2 F (36.8 C)   Resp 16   Ht 5\' 4"  (1.626 m)   Wt 70.8 kg   SpO2 98%   BMI 26.78 kg/m   Physical Exam  ED Course/Procedures   Clinical Course as of Jul 05 1598  Sun Jul 05, 2018  1500 Patient reports improvement after Norco.  Agreeable to plan for CT.   [JR]    Clinical Course User Index [JR] Robinson, [place] N, PA-C    Procedures  MDM   Transfer of Care Note I assumed care of [name] on 07/05/2018  Briefly, [name] is a 85 y.o. female who:  Presents s/p mechanical fall  Plain films negative for fracture  CT ordereded  Pt able to able with pain control   The plan includes:  Dispo per CT and reassement   Please refer to the original provider's note for additional information regarding the care of [name].  ### Reassessment: I personally reassessed the patient:  Vital Signs:  The most current vitals were  Vitals:   07/05/18 1515 07/05/18 1530  BP: (!) 149/67 (!) 153/68  Pulse: 78 67  Resp:    Temp:    SpO2: 98% 98%    Hemodynamics:  The patient is hemodynamically stable. Mental Status:  The patient is Alert  Additional MDM: Upon reassessing patient, patient was calm, resting comfortably, pain much improved, CT scan negative for acute fracture, patient able to ambulate at baseline, patient feels comfortable going home and being able to carry out activities of daily living at baseline. Based on the above findings, I believe patient is hemodynamically stable for discharge  Patient educated about specific return precautions for given chief complaint and symptoms.  Patient educated about follow-up with PCP.  Patient expressed understanding of return precautions and need for follow-up.  Patient discharged.  The above care was discussed with and agreed upon by my attending physician.  The above care was discussed with and agreed upon by my attending physician.         Lonzo Candy, MD 07/05/18 1707    Gareth Morgan, MD 07/09/18 1620

## 2018-07-05 NOTE — ED Notes (Signed)
Patient transported to CT 

## 2018-07-05 NOTE — ED Triage Notes (Signed)
Daughter stated, she called this morning with rt. lef aND KNEE [employer]NESS [place]. HER DR. TOLD HER TO COME HERE.

## 2018-07-05 NOTE — ED Notes (Signed)
Notified family of patient status. Family verbalized understanding of care plan

## 2018-07-05 NOTE — ED Notes (Signed)
Joesph July daughter- [phone]

## 2018-07-05 NOTE — Discharge Instructions (Addendum)

## 2018-07-09 DIAGNOSIS — T148XXD Other injury of unspecified body region, subsequent encounter: Secondary | ICD-10-CM | POA: Diagnosis not present

## 2018-07-09 DIAGNOSIS — G8929 Other chronic pain: Secondary | ICD-10-CM | POA: Diagnosis not present

## 2018-07-09 DIAGNOSIS — M25561 Pain in right knee: Secondary | ICD-10-CM | POA: Diagnosis not present

## 2018-07-09 DIAGNOSIS — M25551 Pain in right hip: Secondary | ICD-10-CM | POA: Diagnosis not present

## 2018-07-09 DIAGNOSIS — M1990 Unspecified osteoarthritis, unspecified site: Secondary | ICD-10-CM | POA: Diagnosis not present

## 2018-07-10 ENCOUNTER — Telehealth: Payer: Self-pay | Admitting: *Deleted

## 2018-07-10 NOTE — Patient Outreach (Signed)
[facility]) Care Management  07/10/2018  [name] 04-26-1924 [id number]   CSW made an initial attempt to try and contact patient today to perform phone assessment, as well as assess and assist with social needs and services, without success. A HIPPA compliant message was left for patient on voicemail. CSW is currently awaiting a return call. CSW will have CMA mail unsuccessful outreach letter & make a second outreach attempt within the next 3-4 days, if CSW does not receive a return call from patient in the meantime.    Raynaldo Opitz, LCSW [facility]  Clinical Social Worker cell #: [phone]

## 2018-07-15 ENCOUNTER — Other Ambulatory Visit: Payer: Self-pay | Admitting: *Deleted

## 2018-07-15 NOTE — Patient Outreach (Signed)
[place] [facility]) Care Management  07/15/2018  [name] 11/29/1924 [id number]   CSW called & spoke with patient's daughter, [name] in response to [employer] referral received from [employer] ([place]) for resources for assisted living. Patient's daughter, [name] states that she had been in touch with Zoe at [facility] and was told it would be $753/month - CSW provided daughter with information on other independent & assisted living facilities but daughter states that she plans to contact [facility] to get her mom on their waiting list as it seems like the most appropriate as patient, although almost age 41 is able to complete ADLs on her own and as [place] is closer to her daughter than [place] where she currently lives, her daughter would be able to bring her meals and run errands for her easier. CSW provided information to daughter on Medicaid and application process should they decide to pursue ALF in the future.   CSW will close case as no further CSW needs at this time. Information sent back via secure message to [employer].    Raynaldo Opitz, LCSW [facility]  Clinical Social Worker cell #: [phone]

## 2018-07-27 ENCOUNTER — Encounter: Payer: Self-pay | Admitting: Medical Oncology

## 2018-07-27 ENCOUNTER — Emergency Department
Admission: EM | Admit: 2018-07-27 | Discharge: 2018-07-27 | Disposition: A | Discharge status: Home / Self Care | Payer: PPO | Attending: Emergency Medicine | Admitting: Emergency Medicine

## 2018-07-27 ENCOUNTER — Emergency Department: Payer: PPO

## 2018-07-27 DIAGNOSIS — Z7982 Long term (current) use of aspirin: Secondary | ICD-10-CM | POA: Diagnosis not present

## 2018-07-27 DIAGNOSIS — Z79899 Other long term (current) drug therapy: Secondary | ICD-10-CM | POA: Diagnosis not present

## 2018-07-27 DIAGNOSIS — S0990XA Unspecified injury of head, initial encounter: Secondary | ICD-10-CM | POA: Diagnosis not present

## 2018-07-27 DIAGNOSIS — Z880 Allergy status to penicillin: Secondary | ICD-10-CM | POA: Diagnosis not present

## 2018-07-27 DIAGNOSIS — Y92003 Bedroom of unspecified non-institutional (private) residence as the place of occurrence of the external cause: Secondary | ICD-10-CM | POA: Insufficient documentation

## 2018-07-27 DIAGNOSIS — R0902 Hypoxemia: Secondary | ICD-10-CM | POA: Diagnosis not present

## 2018-07-27 DIAGNOSIS — S0003XA Contusion of scalp, initial encounter: Secondary | ICD-10-CM | POA: Diagnosis not present

## 2018-07-27 DIAGNOSIS — I10 Essential (primary) hypertension: Secondary | ICD-10-CM | POA: Diagnosis not present

## 2018-07-27 DIAGNOSIS — W06XXXA Fall from bed, initial encounter: Secondary | ICD-10-CM | POA: Diagnosis not present

## 2018-07-27 DIAGNOSIS — Y9389 Activity, other specified: Secondary | ICD-10-CM | POA: Diagnosis not present

## 2018-07-27 DIAGNOSIS — E860 Dehydration: Secondary | ICD-10-CM | POA: Insufficient documentation

## 2018-07-27 DIAGNOSIS — Y999 Unspecified external cause status: Secondary | ICD-10-CM | POA: Insufficient documentation

## 2018-07-27 DIAGNOSIS — Z8673 Personal history of transient ischemic attack (TIA), and cerebral infarction without residual deficits: Secondary | ICD-10-CM | POA: Insufficient documentation

## 2018-07-27 DIAGNOSIS — W19XXXA Unspecified fall, initial encounter: Secondary | ICD-10-CM | POA: Diagnosis not present

## 2018-07-27 DIAGNOSIS — Z87891 Personal history of nicotine dependence: Secondary | ICD-10-CM | POA: Diagnosis not present

## 2018-07-27 DIAGNOSIS — R58 Hemorrhage, not elsewhere classified: Secondary | ICD-10-CM | POA: Diagnosis not present

## 2018-07-27 LAB — CBC
HCT: 35 % — ABNORMAL LOW (ref 36.0–46.0)
Hemoglobin: 11.3 g/dL — ABNORMAL LOW (ref 12.0–15.0)
MCH: 33.4 pg (ref 26.0–34.0)
MCHC: 32.3 g/dL (ref 30.0–36.0)
MCV: 103.6 fL — ABNORMAL HIGH (ref 80.0–100.0)
Platelets: 264 10*3/uL (ref 150–400)
RBC: 3.38 MIL/uL — ABNORMAL LOW (ref 3.87–5.11)
RDW: 11.4 % — ABNORMAL LOW (ref 11.5–15.5)
WBC: 11 10*3/uL — ABNORMAL HIGH (ref 4.0–10.5)
nRBC: 0 % (ref 0.0–0.2)

## 2018-07-27 LAB — URINALYSIS, COMPLETE (UACMP) WITH MICROSCOPIC
Bacteria, UA: NONE SEEN
Bilirubin Urine: NEGATIVE
Glucose, UA: NEGATIVE mg/dL
Hgb urine dipstick: NEGATIVE
Ketones, ur: NEGATIVE mg/dL
Leukocytes,Ua: NEGATIVE
Nitrite: NEGATIVE
Protein, ur: NEGATIVE mg/dL
Specific Gravity, Urine: 1.015 (ref 1.005–1.030)
WBC, UA: NONE SEEN WBC/hpf (ref 0–5)
pH: 5 (ref 5.0–8.0)

## 2018-07-27 LAB — BASIC METABOLIC PANEL
Anion gap: 8 (ref 5–15)
BUN: 36 mg/dL — ABNORMAL HIGH (ref 8–23)
CO2: 24 mmol/L (ref 22–32)
Calcium: 9.5 mg/dL (ref 8.9–10.3)
Chloride: 103 mmol/L (ref 98–111)
Creatinine, Ser: 1.28 mg/dL — ABNORMAL HIGH (ref 0.44–1.00)
GFR calc Af Amer: 42 mL/min — ABNORMAL LOW (ref >60)
GFR calc non Af Amer: 37 mL/min — ABNORMAL LOW (ref >60)
Glucose, Bld: 101 mg/dL — ABNORMAL HIGH (ref 70–99)
Potassium: 4.1 mmol/L (ref 3.5–5.1)
Sodium: 135 mmol/L (ref 135–145)

## 2018-07-27 LAB — GLUCOSE, CAPILLARY: Glucose-Capillary: 91 mg/dL (ref 70–99)

## 2018-07-27 MED ORDER — SODIUM CHLORIDE 0.9 % IV BOLUS
750.0000 mL | Freq: Once | INTRAVENOUS | Status: AC
  Administered 2018-07-27: 12:00:00 750 mL via INTRAVENOUS

## 2018-07-27 MED ORDER — TRAMADOL HCL 50 MG PO TABS
50.0000 mg | ORAL_TABLET | ORAL | Status: AC
  Administered 2018-07-27: 50 mg via ORAL
  Filled 2018-07-27: qty 1

## 2018-07-27 NOTE — Discharge Instructions (Signed)
Follow-up closely with your primary care doctor.  Return to the ED if you have a severe headache, develop a fever or stiff neck, sudden and severe headache, confusion, slurred speech, facial droop, weakness or numbness in any arm or leg, extreme fatigue, vision problems, or other symptoms that concern you.

## 2018-07-27 NOTE — ED Notes (Signed)
Patient assisted to bathroom and back to bed.

## 2018-07-27 NOTE — ED Notes (Signed)
Assisted patient to toilet to void.  I brought her a walker and she ambulates well with that.  Got back to bed to await ride.

## 2018-07-27 NOTE — ED Notes (Signed)
Pt ambulated to bathroom with assistance.

## 2018-07-27 NOTE — ED Provider Notes (Signed)
[facility] Emergency Department Provider Note   ____________________________________________   First MD Initiated Contact with Patient 07/27/18 1020     (approximate)  I have reviewed the triage vital signs and the nursing notes.   HISTORY  Chief Complaint Fall and Head Injury    HPI [name] is a 85 y.o. female here for evaluation after a fall at 4 AM  Patient reports she rolled out of bed and struck her head on the floor.  She had to crawl and then got herself.  She called her family who recommended that she should come be checked out because she had a area that she describes a knot of the front of her head.  She reports chronic pain in her right knee but denies that there is anything new there, normally takes tramadol but has not had it this morning.  She takes aspirin but no other blood thinner no neck pain no numbness no tingling no weakness.  Denies any recent illness.  No fevers or chills.  Had a fall a couple weeks ago as well, reports that was secondary to difficulty with her right knee where it is "bone-on-bone" and sees orthopedics for injections  Patient reports she is simply fell out of bed.  Was not having any chest pain or trouble breathing.  Was not feeling sick beforehand.   Past Medical History:  Diagnosis Date  . Arthritis 04/13/09   Right knee  . Carotid artery occlusion July 2005  . Depression   . Hyperlipidemia   . Hypertension   . Stroke [facility]) July 2005   Mini    Patient Active Problem List   Diagnosis Date Noted  . Occlusion and stenosis of carotid artery without mention of cerebral infarction 05/27/2012    Past Surgical History:  Procedure Laterality Date  . CATARACT EXTRACTION W/ INTRAOCULAR LENS  IMPLANT, BILATERAL      Prior to Admission medications   Medication Sig Start Date End Date Taking? Authorizing Provider  amLODipine (NORVASC) 5 MG tablet Take 5 mg by mouth daily.    Yes [provider]   aspirin 81 MG tablet Take 81 mg by mouth daily.   Yes [provider]  atorvastatin (LIPITOR) 20 MG tablet Take 20 mg by mouth daily.    Yes [provider]  clonazePAM (KLONOPIN) 0.5 MG tablet Take 0.5 mg by mouth 2 (two) times daily.    Yes [provider]  metoprolol tartrate (LOPRESSOR) 25 MG tablet Take 25 mg by mouth daily.  06/12/17  Yes [provider]  PARoxetine (PAXIL) 30 MG tablet Take 30 mg by mouth daily.    Yes [provider]  saccharomyces boulardii (FLORASTOR) 250 MG capsule Take 250 mg by mouth daily.   Yes [provider]  traMADol (ULTRAM) 50 MG tablet Take 50 mg by mouth every 6 (six) hours as needed for moderate pain.   Yes [provider]    Allergies Penicillins, Sulfa antibiotics, Codeine, Penicillin g, Prednisone, and Sulfamethoxazole-trimethoprim  Family History  Problem Relation Age of Onset  . Heart disease Brother   . Coronary artery disease Brother   . Heart disease Brother   . Heart disease Brother     Social History Social History   Tobacco Use  . Smoking status: Never Smoker  . Smokeless tobacco: Former Systems developer    Types: Snuff  Substance Use Topics  . Alcohol use: No    Alcohol/week: 0.0 standard drinks  . Drug  use: No    Review of Systems Constitutional: No fever/chills no exposure to coronavirus Eyes: No visual changes. ENT: No sore throat.  No neck pain Cardiovascular: Denies chest pain. Respiratory: Denies shortness of breath. Gastrointestinal: No abdominal pain.   Genitourinary: Negative for dysuria. Musculoskeletal: Negative for back pain.  Chronic pain right knee no new changes.  No new injury to the arms or legs. Skin: Negative for rash. Neurological: Negative for headaches, areas of focal weakness or numbness.  Reports the top of her scalp feels sore but not really a headache.    ____________________________________________   PHYSICAL EXAM:  VITAL SIGNS: ED  Triage Vitals [07/27/18 1020]  Enc Vitals Group     BP (!) 151/68     Pulse Rate 70     Resp 16     Temp 98.3 F (36.8 C)     Temp Source Oral     SpO2 96 %     Weight 154 lb 5.2 oz (70 kg)     Height 5\' 4"  (1.626 m)     Head Circumference      Peak Flow      Pain Score 8     Pain Loc      Pain Edu?      Excl. in [place]?     Constitutional: Alert and oriented to year and month but not to specific day. Well appearing and in no acute distress.  Pleasant. Eyes: Conjunctivae are normal. Head: Atraumatic there is some mild edema just anterior to the vertex of the forehead.  No open lacerations. Nose: No congestion/rhinnorhea. Mouth/Throat: Mucous membranes are moist. Neck: No stridor.  Sits herself up.  No tenderness to palpation of the cervical thoracic or lumbar spine.  Full range of motion the neck without pain or discomfort. Cardiovascular: Normal rate, regular rhythm. Grossly normal heart sounds.  Good peripheral circulation. Respiratory: Normal respiratory effort.  No retractions. Lungs CTAB. Gastrointestinal: Soft and nontender. No distention. Musculoskeletal: No lower extremity tenderness nor edema.  Arthritic changes to the knees bilaterally.  Minimal range of motion of the right knee, denies injury and it does not appear to form but she reports this is chronic from her arthritis.  She moves all extremities without deficits. Neurologic:  Normal speech and language. No gross focal neurologic deficits are appreciated.  Skin:  Skin is warm, dry and intact. No rash noted. Psychiatric: Mood and affect are normal. Speech and behavior are normal.  ____________________________________________   LABS (all labs ordered are listed, but only abnormal results are displayed)  Labs Reviewed  URINALYSIS, COMPLETE (UACMP) WITH MICROSCOPIC - Abnormal; Notable for the following components:      Result Value   Color, Urine YELLOW (*)    APPearance CLEAR (*)    All other components within  normal limits  CBC - Abnormal; Notable for the following components:   WBC 11.0 (*)    RBC 3.38 (*)    Hemoglobin 11.3 (*)    HCT 35.0 (*)    MCV 103.6 (*)    RDW 11.4 (*)    All other components within normal limits  BASIC METABOLIC PANEL - Abnormal; Notable for the following components:   Glucose, Bld 101 (*)    BUN 36 (*)    Creatinine, Ser 1.28 (*)    GFR calc non Af Amer 37 (*)    GFR calc Af Amer 42 (*)    All other components within normal limits  GLUCOSE, CAPILLARY  CBG MONITORING, ED  ____________________________________________  EKG  Reviewed interpreted by me at 1030 Heart rate 65 QRS 100 QTc 420 Normal sinus rhythm, probable LVH.  No evidence acute ischemia ____________________________________________  RADIOLOGY  Ct Head Wo Contrast  Result Date: 07/27/2018 CLINICAL DATA:  [name] from bed today with right-sided hematoma. EXAM: CT HEAD WITHOUT CONTRAST TECHNIQUE: Contiguous axial images were obtained from the base of the skull through the vertex without intravenous contrast. COMPARISON:  01/07/2007 FINDINGS: Brain: Generalized atrophy. Chronic small-vessel ischemic changes of the white matter. Old right frontal cortical and subcortical infarction. No sign of acute infarction, mass lesion, hemorrhage, hydrocephalus or extra-axial collection. Vascular: There is atherosclerotic calcification of the major vessels at the base of the brain. Skull: No skull fracture. Sinuses/Orbits: Clear/normal Other: Right forehead scalp hematoma. IMPRESSION: No acute intracranial finding. Atrophy and chronic small-vessel ischemic changes. No skull fracture.  Right frontal scalp hematoma. Electronically Signed   By: Nelson Chimes M.D.   On: 07/27/2018 10:42    CT imaging reviewed negative for acute intracranial injury.  Hematoma noted ____________________________________________   PROCEDURES  Procedure(s) performed: None  Procedures  Critical Care performed: No   ____________________________________________   INITIAL IMPRESSION / ASSESSMENT AND PLAN / ED COURSE  Pertinent labs & imaging results that were available during my care of the patient were reviewed by me and considered in my medical decision making (see chart for details).   Patient reports mechanical fall.  Reports she rolled out of bed.  Obtain CT head given the patient's age, increased risk for venous injury dural injury.  No acute neuro deficits.  Nexus negative regarding cervical spine.  No deficits.  Chronic right knee pain without evidence of injury or deformity.  Discussed with her daughter, reports her mother has had some slowly worsening short-term memory problems for several months and they are concerned she may be starting developed some dementia.  However overall, she functions well at home, lives independently family checks on her regularly.  Has chronic right knee issues and sees orthopedics in [place] for this.  Clinical Course as of Jul 27 1430  Mon Jul 27, 2018  1033 Discussed with Philomena Doheny, Daughter.    [MQ]  1151 Labs reviewed, modest dehydration based on lab work.  No previous for comparison, but no noted history of renal disease either.  Will provide IV hydration, thereafter I suspect patient will likely be able to discharge  [MQ]    Clinical Course User Index [MQ] Delman Kitten, MD   ----------------------------------------- 2:33 PM on 07/27/2018 -----------------------------------------  Case management discussed with the patient's daughter, considered in-home services, however the patient who is alert and well-oriented refused these services.  She is agreeable to discharge, will be discharged with her daughter.  I did discuss with her daughter and her daughter trying to encourage the patient to wear a life alert and will check on her regularly.  Patient is alert, oriented and appears well and appropriate for discharge at this time.  No COVID symptoms or  exposure  [name] was evaluated in Emergency Department on 07/27/2018 for the symptoms described in the history of present illness. She was evaluated in the context of the global COVID-19 pandemic, which necessitated consideration that the patient might be at risk for infection with the SARS-CoV-2 virus that causes COVID-19. Institutional protocols and algorithms that pertain to the evaluation of patients at risk for COVID-19 are in a state of rapid change based on information released by regulatory bodies including the CDC and federal and  state organizations. These policies and algorithms were followed during the patient's care in the ED.   ____________________________________________   FINAL CLINICAL IMPRESSION(S) / ED DIAGNOSES  Final diagnoses:  Fall, initial encounter  Dehydration, mild  Hematoma of scalp, initial encounter  Closed head injury, initial encounter        Note:  This document was prepared using Dragon voice recognition software and may include unintentional dictation errors       Delman Kitten, MD 07/27/18 1434

## 2018-07-27 NOTE — TOC Transition Note (Signed)
Transition of Care [facility]) - CM/SW Discharge Note   Patient Details  Name: [name] MRN: [id number] Date of Birth: 1924-11-11  [facility]) CM/SW Contact:  Marshell Garfinkel, RN Phone Number: 07/27/2018, 2:19 PM   Clinical Narrative:    Patient refusing home health services. She has been provided list of home health agencies. She lives at home alone. Her daughter "[name]" Philomena Doheny [phone]. Patient feels that her arthritis medication that she recently started is causing her to fall. [name] said that she is referring to her Tramadol. PCP is in [place]. Per [name], patient's home is nasty; it has been like this per [place] for 4-6 months. She's seen ortho and she has "bone on bone" but "will not do surgery.  She fell in May and then this visit.  Daughter [name] provided information for APS, [facility], and home health services. Patient has rollator and cane available at home.     Final next level of care: Home/Self Care Barriers to Discharge: No Barriers Identified   Patient Goals and CMS Choice Patient states their goals for this hospitalization and ongoing recovery are:: "to go home". CMS Medicare.gov Compare Post Acute Care list provided to:: Patient Choice offered to / list presented to : Patient, Adult Children  Discharge Placement                       Discharge Plan and Services In-house Referral: NA Discharge Planning Services: NA Post Acute Care Choice: Home Health                               Social Determinants of Health (SDOH) Interventions     Readmission Risk Interventions No flowsheet data found.

## 2018-07-27 NOTE — ED Triage Notes (Signed)
Pt from home via ems reports that pt rolled out of bed and hit head to floor. Small hematoma noted to top of head. Pt denies new pain, reports chronic rt knee pain. Pt denies LOC. Lives home alone, ambulates with walker. Pt a/o x 4.

## 2018-07-28 ENCOUNTER — Emergency Department
Admission: EM | Admit: 2018-07-28 | Discharge: 2018-07-28 | Disposition: A | Discharge status: Home / Self Care | Payer: PPO | Attending: Emergency Medicine | Admitting: Emergency Medicine

## 2018-07-28 ENCOUNTER — Emergency Department: Payer: PPO

## 2018-07-28 ENCOUNTER — Other Ambulatory Visit: Payer: Self-pay

## 2018-07-28 ENCOUNTER — Other Ambulatory Visit: Payer: Self-pay | Admitting: *Deleted

## 2018-07-28 DIAGNOSIS — R0781 Pleurodynia: Secondary | ICD-10-CM | POA: Diagnosis not present

## 2018-07-28 DIAGNOSIS — W19XXXA Unspecified fall, initial encounter: Secondary | ICD-10-CM | POA: Diagnosis not present

## 2018-07-28 DIAGNOSIS — Z87891 Personal history of nicotine dependence: Secondary | ICD-10-CM | POA: Diagnosis not present

## 2018-07-28 DIAGNOSIS — I10 Essential (primary) hypertension: Secondary | ICD-10-CM | POA: Insufficient documentation

## 2018-07-28 DIAGNOSIS — R410 Disorientation, unspecified: Secondary | ICD-10-CM | POA: Diagnosis not present

## 2018-07-28 DIAGNOSIS — W19XXXD Unspecified fall, subsequent encounter: Secondary | ICD-10-CM | POA: Diagnosis not present

## 2018-07-28 DIAGNOSIS — S0003XA Contusion of scalp, initial encounter: Secondary | ICD-10-CM | POA: Diagnosis not present

## 2018-07-28 DIAGNOSIS — Z8673 Personal history of transient ischemic attack (TIA), and cerebral infarction without residual deficits: Secondary | ICD-10-CM | POA: Insufficient documentation

## 2018-07-28 DIAGNOSIS — S299XXA Unspecified injury of thorax, initial encounter: Secondary | ICD-10-CM | POA: Diagnosis not present

## 2018-07-28 DIAGNOSIS — R569 Unspecified convulsions: Secondary | ICD-10-CM | POA: Diagnosis not present

## 2018-07-28 LAB — BASIC METABOLIC PANEL
Anion gap: 8 (ref 5–15)
BUN: 31 mg/dL — ABNORMAL HIGH (ref 8–23)
CO2: 24 mmol/L (ref 22–32)
Calcium: 9.1 mg/dL (ref 8.9–10.3)
Chloride: 107 mmol/L (ref 98–111)
Creatinine, Ser: 1.04 mg/dL — ABNORMAL HIGH (ref 0.44–1.00)
GFR calc Af Amer: 54 mL/min — ABNORMAL LOW (ref >60)
GFR calc non Af Amer: 47 mL/min — ABNORMAL LOW (ref >60)
Glucose, Bld: 152 mg/dL — ABNORMAL HIGH (ref 70–99)
Potassium: 3.9 mmol/L (ref 3.5–5.1)
Sodium: 139 mmol/L (ref 135–145)

## 2018-07-28 LAB — CBC
HCT: 30.4 % — ABNORMAL LOW (ref 36.0–46.0)
Hemoglobin: 9.9 g/dL — ABNORMAL LOW (ref 12.0–15.0)
MCH: 33.9 pg (ref 26.0–34.0)
MCHC: 32.6 g/dL (ref 30.0–36.0)
MCV: 104.1 fL — ABNORMAL HIGH (ref 80.0–100.0)
Platelets: 229 10*3/uL (ref 150–400)
RBC: 2.92 MIL/uL — ABNORMAL LOW (ref 3.87–5.11)
RDW: 11.6 % (ref 11.5–15.5)
WBC: 7.5 10*3/uL (ref 4.0–10.5)
nRBC: 0 % (ref 0.0–0.2)

## 2018-07-28 NOTE — ED Notes (Signed)
Patient transported to CT 

## 2018-07-28 NOTE — Patient Outreach (Signed)
[facility]) Care Management  07/28/2018  KIRSTON LUTY 12/25/1924 [id number]   CSW received call from patient's daughter, Rob Hickman informing that patient had fallen & went to the ED but had returned home. Patient is now agreeable to ALF placement and daughter is requesting [facility], CSW spoke with Karleen Hampshire at [facility] [phone] to confirm that ALF Medicaid limit is $1248. Patient's daughter confirmed that patient receives less than that - around $1050. CSW provided contact information for Karleen Hampshire to patient's daughter to discuss further & encouraged her to call back if any further assistance is needed.    Raynaldo Opitz, LCSW [facility]  Clinical Social Worker cell #: [phone]

## 2018-07-28 NOTE — ED Notes (Signed)
Pt called out c/o of R knee pain. This RN removed pt pants to look for bruising or swelling. No swelling noted. Some bruising and abrasions. Large bruise noted to pt R flank though.

## 2018-07-28 NOTE — ED Notes (Signed)
Spoke with daughter by phone. Advised daughter that pt was ready for discharge. Daughter stated that pt could not walk. Advised daughter that pt was at this time ambulkating with a walker and lowered herself to the toilet without assistance. Daughter angry a MD had not spoken with her however she did speak with a doctor yesterday but was angry one had not contacted her today. Advised daughter that the RN normally covers the discharge instructions. Daughter thinks that pt is being over sedated. This RN agrees and stated again that the ER MD will not alter RX and will need to follow up her regular MD to alter meds which is in the best interest of the pt. Daughter asks "So she can come home tonight and there won't be any problems at 4 am?" Advised daughter that pt is stable and we are unable to predict future changes. Verified with daughter who was in charge of her medications. Daughter stated that the pt is. Advised daughter pt may need help with managing medications. Daughter stated she was trying to find assisted living but she needed time. Advised daughter that was the best decision.  Advised daughter to par at the front of the ER and come in when she arrives. Daughter states that she can't come in to the ER as she is high risk for corona visus. Advised she call the ER when she arrives so we may bring her mother out to her.

## 2018-07-28 NOTE — ED Provider Notes (Signed)
[facility] Emergency Department Provider Note       Time seen: ----------------------------------------- 11:45 AM on 07/28/2018 -----------------------------------------   I have reviewed the triage vital signs and the nursing notes.  HISTORY   Chief Complaint Fall   HPI [name] is a 85 y.o. female with a history of arthritis, depression, hyperlipidemia, hypertension, CVA, carotid artery occlusion who presents to the ED for recent fall.  Patient arrives by EMS after having a fall this morning.  She was also seen yesterday for a fall as well.  Family is worried because she has become increasingly confused lately.  There is also concerned she may not be medicating correctly.  She denies any complaints or injuries from the fall.  Past Medical History:  Diagnosis Date  . Arthritis 04/13/09   Right knee  . Carotid artery occlusion July 2005  . Depression   . Hyperlipidemia   . Hypertension   . Stroke [facility]) July 2005   Mini    Patient Active Problem List   Diagnosis Date Noted  . Occlusion and stenosis of carotid artery without mention of cerebral infarction 05/27/2012    Past Surgical History:  Procedure Laterality Date  . CATARACT EXTRACTION W/ INTRAOCULAR LENS  IMPLANT, BILATERAL      Allergies Penicillins, Sulfa antibiotics, Codeine, Penicillin g, Prednisone, and Sulfamethoxazole-trimethoprim  Social History Social History   Tobacco Use  . Smoking status: Never Smoker  . Smokeless tobacco: Former Systems developer    Types: Snuff  Substance Use Topics  . Alcohol use: No    Alcohol/week: 0.0 standard drinks  . Drug use: No    Review of Systems Constitutional: Negative for fever. Cardiovascular: Negative for chest pain. Respiratory: Negative for shortness of breath. Gastrointestinal: Negative for abdominal pain, vomiting and diarrhea. Musculoskeletal: Negative for back pain. Skin: Negative for rash. Neurological: Negative for  headaches, focal weakness or numbness.  All systems negative/normal/unremarkable except as stated in the HPI  ____________________________________________   PHYSICAL EXAM:  VITAL SIGNS: ED Triage Vitals  Enc Vitals Group     BP 07/28/18 1137 138/67     Pulse Rate 07/28/18 1137 63     Resp 07/28/18 1137 17     Temp 07/28/18 1137 98 F (36.7 C)     Temp Source 07/28/18 1137 Oral     SpO2 07/28/18 1137 97 %     Weight 07/28/18 1139 157 lb (71.2 kg)     Height 07/28/18 1139 5\' 4"  (1.626 m)     Head Circumference --      Peak Flow --      Pain Score 07/28/18 1138 0     Pain Loc --      Pain Edu? --      Excl. in [place]? --    Constitutional: Alert, Well appearing and in no distress. Eyes: Conjunctivae are normal. Normal extraocular movements. ENT      Head: Normocephalic and atraumatic.      Nose: No congestion/rhinnorhea.      Mouth/Throat: Mucous membranes are moist.      Neck: No stridor. Cardiovascular: Normal rate, regular rhythm. No murmurs, rubs, or gallops. Respiratory: Normal respiratory effort without tachypnea nor retractions. Breath sounds are clear and equal bilaterally. No wheezes/rales/rhonchi. Gastrointestinal: Soft and nontender. Normal bowel sounds Musculoskeletal: Nontender with normal range of motion in extremities. No lower extremity tenderness nor edema. Neurologic:  Normal speech and language. No gross focal neurologic deficits are appreciated.  Skin:  Skin is warm, dry and  intact. No rash noted. Psychiatric: Mood and affect are normal. Speech and behavior are normal.  ____________________________________________  EKG: Interpreted by me.  Sinus rhythm the rate of 63 bpm, normal PR interval, LVH, normal axis, normal QT  ____________________________________________  ED COURSE:  As part of my medical decision making, I reviewed the following data within the [place] History obtained from family if available, nursing notes, old chart and  ekg, as well as notes from prior ED visits. Patient presented for fall with some altered mental status and concerns for dementia, we will assess with labs and imaging as indicated at this time.   Procedures  BRENYA TAULBEE was evaluated in Emergency Department on 07/28/2018 for the symptoms described in the history of present illness. She was evaluated in the context of the global COVID-19 pandemic, which necessitated consideration that the patient might be at risk for infection with the SARS-CoV-2 virus that causes COVID-19. Institutional protocols and algorithms that pertain to the evaluation of patients at risk for COVID-19 are in a state of rapid change based on information released by regulatory bodies including the CDC and federal and state organizations. These policies and algorithms were followed during the patient's care in the ED.  ____________________________________________   LABS (pertinent positives/negatives)  Labs Reviewed  CBC  BASIC METABOLIC PANEL  URINALYSIS, COMPLETE (UACMP) WITH MICROSCOPIC    RADIOLOGY Images were viewed by me  CT head IMPRESSION: 1. Stable age related cerebral atrophy, ventriculomegaly and extensive periventricular white matter disease. 2. No acute intracranial findings or acute skull fracture. 3. Stable right frontal scalp hematoma. IMPRESSION: 1. No rib fracture seen. 2. Borderline cardiomegaly and mild chronic interstitial lung disease. ____________________________________________   DIFFERENTIAL DIAGNOSIS   Dementia, occult infection, dehydration, electrolyte abnormality, depression, subdural hematoma  FINAL ASSESSMENT AND PLAN  Fall, mental status changes   Plan: The patient had presented for concerns about recent falls and confusion. Patient's labs did reveal mild anemia but otherwise no acute process. Patient's imaging was reassuring.  She is alert and pleasantly confused at times but otherwise did not meet inpatient criteria.   She can follow-up as an outpatient or neurology for memory testing.   Laurence Aly, MD    Note: This note was generated in part or whole with voice recognition software. Voice recognition is usually quite accurate but there are transcription errors that can and very often do occur. I apologize for any typographical errors that were not detected and corrected.     Earleen Newport, MD 07/28/18 1440

## 2018-07-28 NOTE — TOC Transition Note (Signed)
Transition of Care [facility]) - CM/SW Discharge Note   Patient Details  Name: [name] MRN: [id number] Date of Birth: 12-28-1924  Transition [facility] Care [facility]) CM/SW Contact:  Marshell Garfinkel, RN Phone Number: 07/28/2018, 4:21 PM   Clinical Narrative:     Home health agency choice provided to patient on last visit and she and daughter chose [facility] this visit. Corene Cornea with [facility] states that PT can see her by Monday. [name] has been updated. Patient/She has a rollator at home but was not using when she fell both times.  Her daughter [name] has a hired Building control surveyor for [employer]. I have messaged PCP DR. Laurann Montana for assistance with private duty care order per daughter Libby's request and notification the EDP has order PT and Social work for home health.  No other needs at this time from [facility].   Final next level of care: [place] Barriers to Discharge: No Barriers Identified   Patient Goals and CMS Choice Patient states their goals for this hospitalization and ongoing recovery are:: "want to go home" CMS Medicare.gov Compare Post Acute Care list provided to:: Patient Choice offered to / list presented to : Patient  Discharge Placement                       Discharge Plan and Services     Post Acute Care Choice: Home Health                    HH Arranged: PT, Social Work [facility] Agency: [place] ([place]) Date [place]: 07/28/18 Time [place]: [place] Representative spoke with at [place]: [place] (SDOH) Interventions     Readmission Risk Interventions No flowsheet data found.

## 2018-07-28 NOTE — Care Management (Signed)
Post discharge note entry: Call received from daughter [name] stating that her mother is not going to allow private duty person or home health agency into her home.  Daughter is disappointed and worried. I suggest patient go to her home or daughter stay with patient, [name] said "I cannot do that, I work".  I have cancelled home health services through [place] with [facility]. Also advised [name] that if she feels patient is unsafe she can call [facility] to file a report- she doesn't want to do that.

## 2018-07-28 NOTE — ED Triage Notes (Signed)
Pt arrives via EMS after having a fall this morning around 6 AM- pt was seen yesterday for a fall as well- family states she has become increasingly confused lately- family states pt was "jerking her arms" after her fall and is concerned that she may not being her clonipine correctly

## 2018-07-28 NOTE — Care Management (Addendum)
PT evaluation requested when medically clear; PASRR [id number]. FL2 started. Daughter notified that patient is being discharged to home. I've asked again about home health services. Daughter said she will wait for EDP to call her with update. EDP updated.

## 2018-07-28 NOTE — NC FL2 (Addendum)
  [place] LEVEL OF CARE SCREENING TOOL     IDENTIFICATION  Patient Name: [name] Birthdate: 01-16-1924 Sex: female Admission Date (Current Location): 07/28/2018  [place] and [place] Number:  Engineering geologist and Address:  [facility], [address] [place], [place] [address]      Provider Number: [id number]  Attending Physician Name and Address:  Earleen Newport, MD  Relative Name and Phone Number:  Rob Hickman [phone]    Current Level of Care: Hospital Recommended Level of Care: [place] Prior Approval Number:    Date Approved/Denied:   PASRR Number:    Discharge Plan: SNF    Current Diagnoses: Patient Active Problem List   Diagnosis Date Noted  . Occlusion and stenosis of carotid artery without mention of cerebral infarction 05/27/2012    Orientation RESPIRATION BLADDER Height & Weight     Self, Time, Situation, Place  Normal Continent Weight: 71.2 kg Height:  5\' 4"  (162.6 cm)  BEHAVIORAL SYMPTOMS/MOOD NEUROLOGICAL BOWEL NUTRITION STATUS      Continent Diet  AMBULATORY STATUS COMMUNICATION OF NEEDS Skin   Extensive Assist Verbally Normal, Bruising                       Personal Care Assistance Level of Assistance  Bathing, Feeding, Dressing Bathing Assistance: Limited assistance Feeding assistance: Limited assistance Dressing Assistance: Limited assistance     Functional Limitations Info  Sight Sight Info: Adequate        SPECIAL CARE FACTORS FREQUENCY  PT (By licensed PT)     PT Frequency: 5X/week              Contractures Contractures Info: Not present    Additional Factors Info  Code Status, Allergies Code Status Info: full code Allergies Info: sulfa, prednisone, codeine, penicillin           Current Medications (07/28/2018):  This is the current hospital active medication list No current facility-administered medications for this encounter.     Current Outpatient Medications  Medication Sig Dispense Refill  . amLODipine (NORVASC) 5 MG tablet Take 5 mg by mouth daily.     Marland Kitchen aspirin 81 MG tablet Take 81 mg by mouth daily.    Marland Kitchen atorvastatin (LIPITOR) 20 MG tablet Take 20 mg by mouth daily.     . clonazePAM (KLONOPIN) 0.5 MG tablet Take 0.5 mg by mouth 2 (two) times daily.     . metoprolol tartrate (LOPRESSOR) 25 MG tablet Take 25 mg by mouth daily.     Marland Kitchen PARoxetine (PAXIL) 30 MG tablet Take 30 mg by mouth daily.     Marland Kitchen saccharomyces boulardii (FLORASTOR) 250 MG capsule Take 250 mg by mouth daily.    . traMADol (ULTRAM) 50 MG tablet Take 50 mg by mouth every 6 (six) hours as needed for moderate pain.       Discharge Medications: Please see discharge summary for a list of discharge medications.  Relevant Imaging Results:  Relevant Lab Results:   Additional Information [id number]  Marshell Garfinkel, RN

## 2018-07-28 NOTE — Care Management (Signed)
Post discharge note entry 07/28/18 0930A: This ED RNCM received call from patient's HCPOA/daughter "[name]" Joesph July stating that her mother fell again last night and lain on floor for unknown number of hours. Patient said she awoke thinking that her nephew was at her window and she got up to let him in.  Nephew was not at window and patient said it was not a dream.  She said she got out of bed, fell and then crawled to the kitchen area where her son found her sitting after breaking into the home. Patient had called son for help. Per [name], patient is now agreeing to placement as she states she cannot stand now.  EDRNCM advised [name] to bring patient back to ED to evaluate for injury- she agreed. RNCM also advised [name] to reach out to [facility] and [employer] for direction in care. Per [facility] will bring patient back to ED for evaluation. We will need PT evaluation.

## 2018-07-31 DIAGNOSIS — I129 Hypertensive chronic kidney disease with stage 1 through stage 4 chronic kidney disease, or unspecified chronic kidney disease: Secondary | ICD-10-CM | POA: Diagnosis not present

## 2018-07-31 DIAGNOSIS — F329 Major depressive disorder, single episode, unspecified: Secondary | ICD-10-CM | POA: Diagnosis not present

## 2018-07-31 DIAGNOSIS — F0390 Unspecified dementia without behavioral disturbance: Secondary | ICD-10-CM | POA: Diagnosis not present

## 2018-08-14 ENCOUNTER — Other Ambulatory Visit: Payer: Self-pay | Admitting: Family Medicine

## 2018-08-14 DIAGNOSIS — Z1231 Encounter for screening mammogram for malignant neoplasm of breast: Secondary | ICD-10-CM

## 2018-09-28 ENCOUNTER — Other Ambulatory Visit: Payer: Self-pay

## 2018-09-28 ENCOUNTER — Ambulatory Visit
Admission: RE | Admit: 2018-09-28 | Discharge: 2018-09-28 | Disposition: A | Discharge status: Home / Self Care | Payer: PPO | Source: Ambulatory Visit | Attending: Family Medicine | Admitting: Family Medicine

## 2018-09-28 DIAGNOSIS — Z1231 Encounter for screening mammogram for malignant neoplasm of breast: Secondary | ICD-10-CM

## 2018-09-30 ENCOUNTER — Other Ambulatory Visit: Payer: Self-pay | Admitting: Family Medicine

## 2018-09-30 DIAGNOSIS — R928 Other abnormal and inconclusive findings on diagnostic imaging of breast: Secondary | ICD-10-CM

## 2018-10-05 ENCOUNTER — Other Ambulatory Visit: Payer: Self-pay

## 2018-10-05 ENCOUNTER — Ambulatory Visit
Admission: RE | Admit: 2018-10-05 | Discharge: 2018-10-05 | Disposition: A | Discharge status: Home / Self Care | Payer: PPO | Source: Ambulatory Visit | Attending: Family Medicine | Admitting: Family Medicine

## 2018-10-05 ENCOUNTER — Other Ambulatory Visit: Payer: Self-pay | Admitting: Family Medicine

## 2018-10-05 DIAGNOSIS — R928 Other abnormal and inconclusive findings on diagnostic imaging of breast: Secondary | ICD-10-CM | POA: Diagnosis not present

## 2018-10-05 DIAGNOSIS — S8012XA Contusion of left lower leg, initial encounter: Secondary | ICD-10-CM | POA: Diagnosis not present

## 2018-10-05 DIAGNOSIS — N6324 Unspecified lump in the left breast, lower inner quadrant: Secondary | ICD-10-CM | POA: Diagnosis not present

## 2018-10-05 DIAGNOSIS — L84 Corns and callosities: Secondary | ICD-10-CM | POA: Diagnosis not present

## 2018-10-05 DIAGNOSIS — N6320 Unspecified lump in the left breast, unspecified quadrant: Secondary | ICD-10-CM

## 2018-10-09 ENCOUNTER — Other Ambulatory Visit: Payer: Self-pay | Admitting: Diagnostic Radiology

## 2018-10-09 ENCOUNTER — Ambulatory Visit
Admission: RE | Admit: 2018-10-09 | Discharge: 2018-10-09 | Disposition: A | Discharge status: Home / Self Care | Payer: PPO | Source: Ambulatory Visit | Attending: Family Medicine | Admitting: Family Medicine

## 2018-10-09 ENCOUNTER — Other Ambulatory Visit: Payer: Self-pay | Admitting: Family Medicine

## 2018-10-09 ENCOUNTER — Other Ambulatory Visit: Payer: Self-pay

## 2018-10-09 DIAGNOSIS — N6320 Unspecified lump in the left breast, unspecified quadrant: Secondary | ICD-10-CM

## 2018-10-09 DIAGNOSIS — N6325 Unspecified lump in the left breast, overlapping quadrants: Secondary | ICD-10-CM | POA: Diagnosis not present

## 2018-10-09 DIAGNOSIS — Z170 Estrogen receptor positive status [ER+]: Secondary | ICD-10-CM | POA: Diagnosis not present

## 2018-10-09 DIAGNOSIS — C50812 Malignant neoplasm of overlapping sites of left female breast: Secondary | ICD-10-CM | POA: Diagnosis not present

## 2018-10-13 ENCOUNTER — Other Ambulatory Visit: Payer: Self-pay

## 2018-10-13 DIAGNOSIS — I6521 Occlusion and stenosis of right carotid artery: Secondary | ICD-10-CM

## 2018-10-16 ENCOUNTER — Encounter: Payer: Self-pay | Admitting: Family

## 2018-10-16 ENCOUNTER — Ambulatory Visit (INDEPENDENT_AMBULATORY_CARE_PROVIDER_SITE_OTHER): Payer: PPO | Admitting: Family

## 2018-10-16 ENCOUNTER — Other Ambulatory Visit: Payer: Self-pay

## 2018-10-16 ENCOUNTER — Ambulatory Visit (HOSPITAL_COMMUNITY)
Admission: RE | Admit: 2018-10-16 | Discharge: 2018-10-16 | Disposition: A | Discharge status: Home / Self Care | Payer: PPO | Source: Ambulatory Visit | Attending: Family | Admitting: Family

## 2018-10-16 VITALS — BP 127/73 | HR 63 | Temp 98.1°F | Resp 14 | Ht 65.0 in | Wt 157.0 lb

## 2018-10-16 DIAGNOSIS — I6521 Occlusion and stenosis of right carotid artery: Secondary | ICD-10-CM

## 2018-10-16 DIAGNOSIS — Z23 Encounter for immunization: Secondary | ICD-10-CM | POA: Diagnosis not present

## 2018-10-16 DIAGNOSIS — Z87891 Personal history of nicotine dependence: Secondary | ICD-10-CM

## 2018-10-16 NOTE — Progress Notes (Signed)
Chief Complaint: Follow up Extracranial Carotid Artery Stenosis   History of Present Illness  [name] is a 85 y.o. female whom Dr. Scot Dock has been monitoring, withknown right ICA occlusion and presents for a routine carotid duplex follow up.  Patient has not had previous carotid artery intervention. She had a stroke in 2005 as manifested by "not feeling right",slurred speech,and left facial droop,was evaluated at [facility]. Daughter states pt has no residual neurological deficitsand has had no subsequent stroke or TIA.  On 08/09/2003 MRI of the head: Multiple areas of hyperintensity in the right perisylvian region extending into the right centrum semiovale and also the right posterior frontal, anterior temporal regions most consistent with acute ischemia.  Pt denies any cardiac problems.  Pt has arthritis pain in her right knee. Pt denies claudication type symptoms with walking, denies non healing wounds.  Daughter states that pt is to see a Psychologist, sport and exercise next week to discuss options to address newly discovered left breast cancer. She had a left breast bx.    Diabetic: No Tobacco use: non-smoker, but she used oral tobacco until March 2017  Pt meds include: Statin : no, states she no longer wants to take ASA: Yes Other anticoagulants/antiplatelets: no    Past Medical History:  Diagnosis Date  . Arthritis 04/13/09   Right knee  . Carotid artery occlusion July 2005  . Depression   . Hyperlipidemia   . Hypertension   . Stroke [facility]) July 2005   Mini    Social History Social History   Tobacco Use  . Smoking status: Never Smoker  . Smokeless tobacco: Former Systems developer    Types: Snuff  Substance Use Topics  . Alcohol use: No    Alcohol/week: 0.0 standard drinks  . Drug use: No    Family History Family History  Problem Relation Age of Onset  . Heart disease Brother   . Coronary artery disease Brother   . Heart disease Brother   . Heart disease Brother    . Ovarian cancer Daughter 51       Died at 43  . Breast cancer Neg Hx     Surgical History Past Surgical History:  Procedure Laterality Date  . CATARACT EXTRACTION W/ INTRAOCULAR LENS  IMPLANT, BILATERAL      Allergies  Allergen Reactions  . Penicillins   . Sulfa Antibiotics   . Codeine Other (See Comments)  . Penicillin G Rash  . Prednisone Palpitations  . Sulfamethoxazole-Trimethoprim Rash    Current Outpatient Medications  Medication Sig Dispense Refill  . amLODipine (NORVASC) 5 MG tablet Take 5 mg by mouth daily.     Marland Kitchen aspirin 81 MG tablet Take 81 mg by mouth daily.    . clonazePAM (KLONOPIN) 0.5 MG tablet Take 0.5 mg by mouth 2 (two) times daily.     . metoprolol tartrate (LOPRESSOR) 25 MG tablet Take 25 mg by mouth daily.     Marland Kitchen PARoxetine (PAXIL) 30 MG tablet Take 30 mg by mouth daily.     Marland Kitchen atorvastatin (LIPITOR) 20 MG tablet Take 20 mg by mouth daily.     Marland Kitchen saccharomyces boulardii (FLORASTOR) 250 MG capsule Take 250 mg by mouth daily.    . traMADol (ULTRAM) 50 MG tablet Take 50 mg by mouth every 6 (six) hours as needed for moderate pain.     No current facility-administered medications for this visit.     Review of Systems : See HPI for pertinent positives and negatives.  Physical Examination  Vitals:   10/16/18 1409 10/16/18 1412  BP: 139/75 127/73  Pulse: 63 63  Resp: 14   Temp: 98.1 F (36.7 C)   TempSrc: Temporal   SpO2: 96%   Weight: 157 lb (71.2 kg)   Height: 5\' 5"  (1.651 m)    Body mass index is 26.13 kg/m.  General: WDWN elderly female in NAD accompanied by her daughter GAIT: slow, steady, using quad cane Eyes: Pupils are equal HENT: No gross abnormalities.  Pulmonary:  Respirations are non-labored, good air movement in all fields, CTAB, no rales, rhonchi, or  wheezes Cardiac: regular rhythm, no detected murmur.  VASCULAR EXAM Carotid Bruits Right Left   Negative Negative     Abdominal aortic pulse is not palpable. Radial pulses are  2+ palpable and equal.                                                                                                                            LE Pulses Right Left       POPLITEAL  not palpable   not palpable       POSTERIOR TIBIAL  faintly palpable   2+ palpable        DORSALIS PEDIS      ANTERIOR TIBIAL not palpable  1+ palpable     Gastrointestinal: soft, nontender, BS WNL, no r/g, no palpable masses. Musculoskeletal: no muscle atrophy/wasting. M/S 5/5 throughout, extremities without ischemic changes Skin: No rashes, no ulcers, no cellulitis.   Neurologic:  A&O X 3; appropriate affect, sensation is normal; speech is normal, CN 2-12 intact, pain and light touch intact in extremities, motor exam as listed above. Psychiatric: Normal thought content, mood appropriate to clinical situation.     DATA Carotid Duplex(10-16-18): Right ICA: confirmed occlusion. Left ICA: 1-39% stenosis Left ECA: >50% stenosis Bilateral vertebral artery flow is antegrade.  Subclavians: Right subclavian artery flow was disturbed. Normal flow hemodynamics were seen in the left subclavian artery. No significant change compared to the exams on 07-12-15, 07-24-16, and 08-11-17.   Assessment: [name] is a 85 y.o. female who had a mild stroke in 2005, has no residual neurological deficits, has had no subsequent strokes or TIA's.  She has a known right ICA occlusion.  She does not have DM, she never smoked, but used smokless tobacco until 2017. She takes a daily 81 mg ASA.   Pt states she no longer wanted to take the statin, denies any side effects from the statin.  I discussed the benefits of a statin with pt and her daughter.  Pt and her daughter will speak with her PCP re this.  Consider statin therapy which is associated with a greater reduction in CVD risk and improved endothelial function, will defer to PCP.  She remains quite active.     Plan: Follow-up in 1 year with Carotid  Duplex scan.    I discussed in depth with the patient the nature of atherosclerosis, and emphasized the  importance of maximal medical management including strict control of blood pressure, blood glucose, and lipid levels, obtaining regular exercise, and continued cessation of smoking.  The patient is aware that without maximal medical management the underlying atherosclerotic disease process will progress, limiting the benefit of any interventions. The patient was given information about stroke prevention and what symptoms should prompt the patient to seek immediate medical care. Thank you for allowing [place] to participate in this patient's care.  Clemon Chambers, RN, MSN, FNP-C [facility] [place] Office: [phone]  Clinic Physician: Donzetta Matters  10/16/18 3:10 PM

## 2018-10-16 NOTE — Patient Instructions (Signed)

## 2018-10-19 DIAGNOSIS — C50912 Malignant neoplasm of unspecified site of left female breast: Secondary | ICD-10-CM | POA: Diagnosis not present

## 2018-10-22 ENCOUNTER — Telehealth: Payer: Self-pay | Admitting: Hematology

## 2018-10-22 ENCOUNTER — Telehealth: Payer: Self-pay | Admitting: *Deleted

## 2018-10-22 NOTE — Telephone Encounter (Signed)
Received a new referral from Dr. Brantley Stage for breast cancer. Ms. Sutliff has been cld and scheduled to see Dr. Burr Medico on 10/19 at 315pm. Appt date and time has been provided to the pt's daughter, Mrs. Joesph July. She's been made aware of our no vistor policy. I let Mrs. Boggs know that we will provide a nurse escort for her mom and have Dr. Burr Medico call her while he mom is in the exam room. She's been made aware to arrive 15 minutes early.

## 2018-10-22 NOTE — Telephone Encounter (Signed)
Received request to change medical oncologist to Dr. Jana Hakim. Scheduled and confirmed new appt for 10/12 at 3pm.

## 2018-10-25 NOTE — Progress Notes (Signed)
[place]  Telephone:([phone] Fax:(336) [phone]    ID: SACHA TOPOR DOB: [birth year]  MR#: [id number]  UXL#:[id number]  Patient Care Team: Kelton Pillar, MD as PCP - General (Family Medicine) Vickey Huger, MD as Attending Physician (Orthopedic Surgery) Harrietta Incorvaia, Virgie Dad, MD as Consulting Physician (Oncology) OTHER MD:   CHIEF COMPLAINT:   CURRENT TREATMENT:    HISTORY OF CURRENT ILLNESS: LAILYN APPELBAUM had routine screening mammography on 09/28/2018 showing a possible abnormality in the left breast. She underwent unilateral left diagnostic mammography with tomography and left breast ultrasonography at [facility] on 10/05/2018 showing: Breast Density Category B. Spot compression views with tomography of the 6 o'clock axis of the left breast show a dense irregular mass with indistinct margins. On physical exam, no mass is palpated in the 6 o'clock axis of the left breast. The skin appears normal. Targeted ultrasound is performed, showing an irregular heterogeneously hypoechoic mass with peripheral echogenic rim at 6 o'clock position 3 cm from the nipple. The mass is taller than wide and measures 8 x 8 x 6 mm. Vascular flow is in the superficial aspect of the mass. Ultrasound of the left axilla is negative for lymphadenopathy.  Accordingly on 10/09/2018 she proceeded to biopsy of the left breast area in question. The pathology from this procedure showed ([id number]): invasive ductal carcinoma, Nottingham grade 2 of 3. Prognostic indicators significant for: estrogen receptor, 100% positive and progesterone receptor, 100% positive, both with strong staining intensity. Proliferation marker Ki67 at 10%. HER2 negative (1+) by immunohistochemistry.  The patient's subsequent history is as detailed below.   INTERVAL HISTORY: Phelan was evaluated in the breast cancer clinic on 10/26/2018.  Her daughter [name] also participated by speaker phone.  Her case was  presented at the multidisciplinary breast cancer conference on 10/21/2018. At that time a preliminary plan was proposed: Medical management and genetics referral  REVIEW OF SYSTEMS: Mauricia denies unusual headaches, visual changes, nausea, vomiting, stiff neck, dizziness, or gait imbalance. There has been no cough, phlegm production, or pleurisy, no chest pain or pressure, and no change in bowel or bladder habits. The patient denies fever, rash, bleeding, unexplained fatigue or unexplained weight loss.  She did tell me that she fell recently after cleaning the stove (apparently some soap dropped on the floor) and have been other falls as well.  She does have a walker at home but does not always use it.  She also 1 time fell while getting out of bed.  A detailed review of systems was otherwise entirely negative.    PAST MEDICAL HISTORY: Past Medical History:  Diagnosis Date   Arthritis 04/13/09   Right knee   Carotid artery occlusion July 2005   Depression    Hyperlipidemia    Hypertension    Stroke [facility]) July 2005   Mini     PAST SURGICAL HISTORY: Past Surgical History:  Procedure Laterality Date   CATARACT EXTRACTION W/ INTRAOCULAR LENS  IMPLANT, BILATERAL       FAMILY HISTORY: Family History  Problem Relation Age of Onset   Heart disease Brother    Coronary artery disease Brother    Heart disease Brother    Heart disease Brother    Ovarian cancer Daughter 87       Died at 76   Breast cancer Neg Hx    Ysabela's father died at age 33, sudden death. Patients' mother died at age 61 from "natural causes".. The patient has 3 brothers and 3 sisters.  Patient denies anyone in her family having breast, prostate, or pancreatic cancer. Chane's oldest daughter was diagnosed with ovarian cancer at the age of 47, and passed at the age of 69.    GYNECOLOGIC HISTORY:  No LMP recorded. Patient is postmenopausal. Menarche: 85 years old Age at first live birth: 85 years  old [place] P: 3 LMP: 9 HRT: No  Hysterectomy?:  No BSO?: no    SOCIAL HISTORY: (Current as of 10/26/2018) Rexine used to work in the [employer].  She is widowed and lives by herself.  Her daughter Butch Penny as noted above died from ovarian cancer at age 6.  Daughter [name], 83, Designer, television/film set at [employer] across the street from the hospital.  Annie Sable 67 is a retired Agricultural consultant.  The patient has 3 grandchildren and 2 great-grandchildren.  She attends a local [place] DIRECTIVES:     HEALTH MAINTENANCE: Social History   Tobacco Use   Smoking status: Never Smoker   Smokeless tobacco: Former Systems developer    Types: Snuff  Substance Use Topics   Alcohol use: No    Alcohol/week: 0.0 standard drinks   Drug use: No    Bone density: at Dr. Delene Ruffini 09/12/2011 showed a T score of -1.9   Allergies  Allergen Reactions   Penicillins    Sulfa Antibiotics    Codeine Other (See Comments)   Penicillin G Rash   Prednisone Palpitations   Sulfamethoxazole-Trimethoprim Rash    Current Outpatient Medications  Medication Sig Dispense Refill   amLODipine (NORVASC) 5 MG tablet Take 5 mg by mouth daily.      aspirin 81 MG tablet Take 81 mg by mouth daily.     atorvastatin (LIPITOR) 20 MG tablet Take 20 mg by mouth daily.      clonazePAM (KLONOPIN) 0.5 MG tablet Take 0.5 mg by mouth 2 (two) times daily.      metoprolol tartrate (LOPRESSOR) 25 MG tablet Take 25 mg by mouth daily.      PARoxetine (PAXIL) 30 MG tablet Take 30 mg by mouth daily.      saccharomyces boulardii (FLORASTOR) 250 MG capsule Take 250 mg by mouth daily.     traMADol (ULTRAM) 50 MG tablet Take 50 mg by mouth every 6 (six) hours as needed for moderate pain.     No current facility-administered medications for this visit.      OBJECTIVE: Elderly white woman examined in a wheelchair  Vitals:   10/26/18 1509  BP: 128/73  Pulse: (!) 58  Resp: 18  Temp: 98.5 F (36.9 C)   SpO2: 97%   Wt Readings from Last 3 Encounters:  10/26/18 156 lb (70.8 kg)  10/16/18 157 lb (71.2 kg)  07/28/18 157 lb (71.2 kg)   Body mass index is 25.96 kg/m.    ECOG FS:2 - Symptomatic, <50% confined to bed  Ocular: Sclerae unicteric Ear-nose-throat: Wearing a mask Lymphatic: No cervical or supraclavicular adenopathy Lungs no rales or rhonchi Heart regular rate and rhythm, rare premature beat Abd soft, nontender, positive bowel sounds MSK kyphosis but no focal spinal tenderness, no joint edema Neuro: non-focal, well-oriented, appropriate affect Breasts: The right breast is unremarkable.  The left breast is status post recent biopsy.  There is a mild ecchymosis.  There is no palpable mass.  Both axillae are benign.   LAB RESULTS:  CMP     Component Value Date/Time   NA 139 07/28/2018 1142   K 3.9 07/28/2018 1142  CL 107 07/28/2018 1142   CO2 24 07/28/2018 1142   GLUCOSE 152 (H) 07/28/2018 1142   BUN 31 (H) 07/28/2018 1142   CREATININE 1.04 (H) 07/28/2018 1142   CALCIUM 9.1 07/28/2018 1142   GFRNONAA 47 (L) 07/28/2018 1142   GFRAA 54 (L) 07/28/2018 1142    No results found for: TOTALPROTELP, ALBUMINELP, A1GS, A2GS, BETS, BETA2SER, GAMS, MSPIKE, SPEI  No results found for: KPAFRELGTCHN, LAMBDASER, KAPLAMBRATIO  Lab Results  Component Value Date   WBC 7.5 07/28/2018   HGB 9.9 (L) 07/28/2018   HCT 30.4 (L) 07/28/2018   MCV 104.1 (H) 07/28/2018   PLT 229 07/28/2018    No results found for: LABCA2  No components found for: [id number]  No results for input(s): INR in the last 168 hours.  No results found for: LABCA2  No results found for: YIR485  No results found for: IOE703  No results found for: JKK938  No results found for: CA2729  No components found for: HGQUANT  No results found for: CEA1 / No results found for: CEA1   No results found for: AFPTUMOR  No results found for: CHROMOGRNA  No results found for: PSA1  No visits with results  within 3 Day(s) from this visit.  Latest known visit with results is:  Admission on 07/28/2018, Discharged on 07/28/2018  Component Date Value Ref Range Status   WBC 07/28/2018 7.5  4.0 - 10.5 K/uL Final   RBC 07/28/2018 2.92* 3.87 - 5.11 MIL/uL Final   Hemoglobin 07/28/2018 9.9* 12.0 - 15.0 g/dL Final   HCT 07/28/2018 30.4* 36.0 - 46.0 % Final   MCV 07/28/2018 104.1* 80.0 - 100.0 fL Final   MCH 07/28/2018 33.9  26.0 - 34.0 pg Final   MCHC 07/28/2018 32.6  30.0 - 36.0 g/dL Final   RDW 07/28/2018 11.6  11.5 - 15.5 % Final   Platelets 07/28/2018 229  150 - 400 K/uL Final   nRBC 07/28/2018 0.0  0.0 - 0.2 % Final   Performed at [facility], [address] [place], [place] [address]   Sodium 07/28/2018 139  135 - 145 mmol/L Final   Potassium 07/28/2018 3.9  3.5 - 5.1 mmol/L Final   Chloride 07/28/2018 107  98 - 111 mmol/L Final   CO2 07/28/2018 24  22 - 32 mmol/L Final   Glucose, Bld 07/28/2018 152* 70 - 99 mg/dL Final   BUN 07/28/2018 31* 8 - 23 mg/dL Final   Creatinine, Ser 07/28/2018 1.04* 0.44 - 1.00 mg/dL Final   Calcium 07/28/2018 9.1  8.9 - 10.3 mg/dL Final   GFR calc non Af Amer 07/28/2018 47* >60 mL/min Final   GFR calc Af Amer 07/28/2018 54* >60 mL/min Final   Anion gap 07/28/2018 8  5 - 15 Final   Performed at [facility], [address] [place], [place] [address]    (this displays the last labs from the last 3 days)  No results found for: TOTALPROTELP, ALBUMINELP, A1GS, A2GS, BETS, BETA2SER, GAMS, MSPIKE, SPEI (this displays SPEP labs)  No results found for: KPAFRELGTCHN, LAMBDASER, KAPLAMBRATIO (kappa/lambda light chains)  No results found for: HGBA, HGBA2QUANT, HGBFQUANT, HGBSQUAN (Hemoglobinopathy evaluation)   No results found for: LDH  No results found for: IRON, TIBC, IRONPCTSAT (Iron and TIBC)  No results found for: FERRITIN  Urinalysis    Component Value Date/Time   COLORURINE YELLOW (A) 07/27/2018 1056    APPEARANCEUR CLEAR (A) 07/27/2018 1056   LABSPEC 1.015 07/27/2018 1056   PHURINE 5.0  07/27/2018 [place] 07/27/2018 1056   [place] 07/27/2018 [place] 07/27/2018 1056   [place] 07/27/2018 1056   PROTEINUR NEGATIVE 07/27/2018 1056   NITRITE NEGATIVE 07/27/2018 1056   LEUKOCYTESUR NEGATIVE 07/27/2018 1056     STUDIES:  US Breast Ltd Uni Left Inc Axilla  Result Date: 10/05/2018 CLINICAL DATA:  85 year old patient recalled from recent screening mammogram for evaluation of a left breast mass. EXAM: DIGITAL DIAGNOSTIC LEFT MAMMOGRAM WITH TOMO ULTRASOUND LEFT BREAST COMPARISON:  Screening mammogram September 28, 2018 ACR Breast Density Category b: There are scattered areas of fibroglandular density. FINDINGS: Spot compression views with tomography of the 6 o'clock axis of the left breast show a dense irregular mass with indistinct margins. On physical exam, no mass is palpated in the 6 o'clock axis of the left breast. The skin appears normal. Targeted ultrasound is performed, showing an irregular heterogeneously hypoechoic mass with peripheral echogenic rim at 6 o'clock position 3 cm from the nipple. The mass is taller than wide and measures 8 x 8 x 6 mm. Vascular flow is in the superficial aspect of the mass. Ultrasound of the left axilla is negative for lymphadenopathy. IMPRESSION: Suspicious 8 mm mass in the 6 o'clock position of the left breast. RECOMMENDATION: Ultrasound-guided core needle biopsy of the left breast mass is recommended. Findings were discussed with the patient and with her daughter who accompanied her to the appointment today. The patient is scheduling the biopsy for Friday September 25th. I have discussed the findings and recommendations with the patient. If applicable, a reminder letter will be sent to the patient regarding the next appointment. BI-RADS CATEGORY  5: Highly suggestive of malignancy. Electronically Signed   By: Curlene Dolphin M.D.   On: 10/05/2018 16:21   Mm Diag Breast Tomo Uni Left  Result Date: 10/05/2018 CLINICAL DATA:  85 year old patient recalled from recent screening mammogram for evaluation of a left breast mass. EXAM: DIGITAL DIAGNOSTIC LEFT MAMMOGRAM WITH TOMO ULTRASOUND LEFT BREAST COMPARISON:  Screening mammogram September 28, 2018 ACR Breast Density Category b: There are scattered areas of fibroglandular density. FINDINGS: Spot compression views with tomography of the 6 o'clock axis of the left breast show a dense irregular mass with indistinct margins. On physical exam, no mass is palpated in the 6 o'clock axis of the left breast. The skin appears normal. Targeted ultrasound is performed, showing an irregular heterogeneously hypoechoic mass with peripheral echogenic rim at 6 o'clock position 3 cm from the nipple. The mass is taller than wide and measures 8 x 8 x 6 mm. Vascular flow is in the superficial aspect of the mass. Ultrasound of the left axilla is negative for lymphadenopathy. IMPRESSION: Suspicious 8 mm mass in the 6 o'clock position of the left breast. RECOMMENDATION: Ultrasound-guided core needle biopsy of the left breast mass is recommended. Findings were discussed with the patient and with her daughter who accompanied her to the appointment today. The patient is scheduling the biopsy for Friday September 25th. I have discussed the findings and recommendations with the patient. If applicable, a reminder letter will be sent to the patient regarding the next appointment. BI-RADS CATEGORY  5: Highly suggestive of malignancy. Electronically Signed   By: Curlene Dolphin M.D.   On: 10/05/2018 16:21   Mm 3d Screen Breast Bilateral  Result Date: 09/29/2018 CLINICAL DATA:  Screening. EXAM: DIGITAL SCREENING BILATERAL MAMMOGRAM WITH TOMO AND CAD COMPARISON:  Previous exam(s). ACR Breast Density Category b: There are scattered  areas of fibroglandular density. FINDINGS: In the left breast, a possible mass  warrants further evaluation. In the right breast, no findings suspicious for malignancy. Images were processed with CAD. IMPRESSION: Further evaluation is suggested for possible mass in the left breast. RECOMMENDATION: Diagnostic mammogram and possibly ultrasound of the left breast. (Code:[id number]) The patient will be contacted regarding the findings, and additional imaging will be scheduled. BI-RADS CATEGORY  0: Incomplete. Need additional imaging evaluation and/or prior mammograms for comparison. Electronically Signed   By: Zerita Boers M.D.   On: 09/29/2018 11:57   Mm Clip Placement Left  Result Date: 10/09/2018 CLINICAL DATA:  Status post ultrasound-guided biopsy of a LEFT breast mass at the 6 o'clock axis. EXAM: DIAGNOSTIC LEFT MAMMOGRAM POST ULTRASOUND BIOPSY COMPARISON:  Previous exam(s). FINDINGS: Mammographic images were obtained following ultrasound guided biopsy of the LEFT breast mass at the 6 o'clock axis. Ribbon shaped clip is appropriately positioned within the lower LEFT breast. IMPRESSION: Ribbon shaped clip is appropriately positioned within the lower LEFT breast corresponding to the site of the targeted mass at the 6 o'clock axis. Final Assessment: Post Procedure Mammograms for Marker Placement Electronically Signed   By: Franki Cabot M.D.   On: 10/09/2018 14:37   [place] Lt Breast Bx W Loc Dev 1st Lesion Img Bx Spec US Guide  Addendum Date: 10/14/2018   ADDENDUM REPORT: 10/13/2018 06:58 ADDENDUM: Pathology revealed GRADE II INVASIVE DUCTAL CARCINOMA, DUCTAL CARCINOMA IN SITU of the Left breast, 6 o'clock. This was found to be concordant by Dr. Franki Cabot. Pathology results were discussed with the patient and her daughter, Rob Hickman by telephone, per request. The patient reported doing well after the biopsy with tenderness at the site. Post biopsy instructions and care were reviewed and questions were answered. The patient was encouraged to call [facility] for  any additional concerns. Surgical consultation has been arranged with Dr. Erroll Luna at [facility] on October 19, 2018. Pathology results reported by Terie Purser, RN on 10/13/2018. Electronically Signed   By: Franki Cabot M.D.   On: 10/13/2018 06:58   Result Date: 10/14/2018 CLINICAL DATA:  Patient with a LEFT breast mass at the 6 o'clock axis presents today for ultrasound-guided core biopsy. EXAM: ULTRASOUND GUIDED LEFT BREAST CORE NEEDLE BIOPSY COMPARISON:  Previous exam(s). PROCEDURE: I met with the patient and we discussed the procedure of ultrasound-guided biopsy, including benefits and alternatives. We discussed the high likelihood of a successful procedure. We discussed the risks of the procedure including infection, bleeding, tissue injury, clip migration, and inadequate sampling. Informed written consent was given. The usual time-out protocol was performed immediately prior to the procedure. Lesion quadrant: 6 o'clock Using sterile technique and 1% Lidocaine as local anesthetic, under direct ultrasound visualization, a 12 gauge spring-loaded device was used to perform biopsy of the LEFT breast mass at the 6 o'clock axisusing a lateral approach. At the conclusion of the procedure, a ribbon shaped tissue marker clip was deployed into the biopsy cavity. Follow-up 2-view mammogram was performed and dictated separately. IMPRESSION: Ultrasound-guided biopsy of the LEFT breast mass at the 6 o'clock axis. No apparent complications. Electronically Signed: By: Franki Cabot M.D. On: 10/09/2018 14:32   Vas US Carotid  Result Date: 10/16/2018 Carotid Arterial Duplex Study Indications:       Carotid artery disease. Comparison Study:  08/11/17: Right ICA occluded. Left ICA 1-39% stenosis. Performing Technologist: Ralene Cork RVT  Examination Guidelines: A complete evaluation includes B-mode imaging, spectral Doppler,  color Doppler, and power Doppler as needed of all accessible portions of each  vessel. Bilateral testing is considered an integral part of a complete examination. Limited examinations for reoccurring indications may be performed as noted.  Right Carotid Findings: +----------+--------+--------+--------+------------------+--------+             PSV cm/s EDV cm/s Stenosis Plaque Description Comments  +----------+--------+--------+--------+------------------+--------+  CCA Prox   97       0                                              +----------+--------+--------+--------+------------------+--------+  CCA Mid    46       0                                              +----------+--------+--------+--------+------------------+--------+  CCA Distal 39       0                                              +----------+--------+--------+--------+------------------+--------+  ICA Prox                     Occluded                              +----------+--------+--------+--------+------------------+--------+  ICA Mid                      Occluded                              +----------+--------+--------+--------+------------------+--------+  ICA Distal                   Occluded                              +----------+--------+--------+--------+------------------+--------+  ECA        95       0                 heterogenous                 +----------+--------+--------+--------+------------------+--------+ +----------+--------+-------+---------+-------------------+             PSV cm/s EDV cms Describe  Arm Pressure (mmHG)  +----------+--------+-------+---------+-------------------+  Subclavian 181              Turbulent                      +----------+--------+-------+---------+-------------------+ +---------+--------+--+--------+--+---------+  Vertebral PSV cm/s 83 EDV cm/s 15 Antegrade  +---------+--------+--+--------+--+---------+  Left Carotid Findings: +----------+--------+--------+--------+------------------+--------+             PSV cm/s EDV cm/s Stenosis Plaque Description Comments   +----------+--------+--------+--------+------------------+--------+  CCA Prox   109      18                                             +----------+--------+--------+--------+------------------+--------+  CCA Mid    104      16                heterogenous                 +----------+--------+--------+--------+------------------+--------+  CCA Distal 85       14                heterogenous                 +----------+--------+--------+--------+------------------+--------+  ICA Prox   113      27                calcific                     +----------+--------+--------+--------+------------------+--------+  ICA Mid    118      22                                             +----------+--------+--------+--------+------------------+--------+  ICA Distal 96       29                                             +----------+--------+--------+--------+------------------+--------+  ECA        276      0        >50%     heterogenous                 +----------+--------+--------+--------+------------------+--------+ +----------+--------+--------+----------------+-------------------+             PSV cm/s EDV cm/s Describe         Arm Pressure (mmHG)  +----------+--------+--------+----------------+-------------------+  Subclavian 196               Multiphasic, WNL                      +----------+--------+--------+----------------+-------------------+ +---------+--------+--+--------+--+---------+  Vertebral PSV cm/s 74 EDV cm/s 16 Antegrade  +---------+--------+--+--------+--+---------+  Summary: Right Carotid: Evidence consistent with a total occlusion of the right ICA. Left Carotid: Velocities in the left ICA are consistent with a 1-39% stenosis.               Non-hemodynamically significant plaque <50% noted in the CCA. The               ECA appears >50% stenosed. Vertebrals:  Bilateral vertebral arteries demonstrate antegrade flow. Subclavians: Right subclavian artery flow was disturbed. Normal flow              hemodynamics  were seen in the left subclavian artery. *See table(s) above for measurements and observations.  Electronically signed by Servando Snare MD on 10/16/2018 at 2:38:10 PM.    Final      ELIGIBLE FOR AVAILABLE RESEARCH PROTOCOL: no   ASSESSMENT: 85 y.o. [place], [place] woman status post left central breast biopsy 10/09/2018 for a clinical T1b N0, stage IA days of ductal carcinoma, grade 2, estrogen and progesterone receptor strongly positive, with an MIB-1 of 10%, and no HER-2 amplification.  (1) anastrozole started 10/26/2018  (2) osteopenia:   (a) bone density 09/12/2011 at [place] shows a T score of -1.9  (b) to start a yearly zoledronate April 2021  PLAN: I spent approximately 60 minutes face to face with Charnay with more than 50% of that time spent in counseling and coordination of care. Specifically we reviewed the biology of the patient's diagnosis and the specifics of her situation.  We first reviewed the fact that cancer is not one disease but more than 100 different diseases and that it is important to keep them separate-- otherwise when friends and relatives discuss their own cancer experiences with Clementina confusion can result. Similarly we explained that if breast cancer spreads to the bone or liver, the patient would not have bone cancer or liver cancer, but breast cancer in the bone and breast cancer in the liver: one cancer in three places-- not 3 different cancers which otherwise would have to be treated in 3 different ways.  We discussed the difference between local and systemic therapy. In terms of loco-regional treatment, lumpectomy plus radiation is equivalent to mastectomy as far as survival is concerned. For this reason, and because the cosmetic results are generally superior, we usually recommend breast conserving surgery.   However her case is different because of her advanced age and the fact that she has a very small slow-growing estrogen receptor positive breast  cancer.  We noted that in terms of sequencing of treatments, whether systemic therapy or surgery is done first does not affect the ultimate outcome.  Next we went over the options for systemic therapy which are anti-estrogens, anti-HER-2 immunotherapy, and chemotherapy. Carrolyn does not meet criteria for anti-HER-2 immunotherapy. She is a good candidate for anti-estrogens.  Chemotherapy is generally not very effective and slow-growing tumors, and it certainly not indicated in a T1b lesion in a 85 year old.  Accordingly the plan will be for antiestrogens alone.  We are going to go with anastrozole and we discussed the possible toxicities side effects and complications of this agent, which she will start today.  She does have osteopenia and this drug can worsen that problem.  Eather is already at high fall risk and could easily break a hip.  Accordingly we are going to start her on zoledronate when she returns to see [place] in April.  We are not planning on surgery for her but we will continue to follow the breast cancer by ultrasonography every 82-monthintervals.  The first ultrasound will be in January and she will see Dr. GLaurann Montanashortly after that.  She will return to see me in April to make sure she is tolerating anastrozole well and to receive the zoledronate.  At that time we will set her up for her additional studies  L[place]aileedoes meet criteria for genetics testing. In patients who carry a deleterious mutation [for example in a  BRCA gene], the risk of a new breast cancer developing in the future may be sufficiently great that the patient may choose bilateral mastectomies. However if she wishes to keep her breasts in that situation it is safe to do so. That would require intensified screening, which generally means not only yearly mammography but a yearly breast MRI as well.  More importantly if she proves to carry a mutation other family members would have to be tested.  We are arranging for her to  have her genetics test drawn when she returns to see me in April.  LRamandeephas a good understanding of the overall plan. She agrees with it. She knows the goal of treatment in her case is cure. She will call with any problems that may develop before her next visit here.  Tamyia Minich, Virgie Dad, MD  10/26/18 3:21 PM Medical Oncology and Hematology [facility] [place], [place] [address] Tel. [phone]    Fax. [phone]   I, Jacqualyn Posey am acting as a Education administrator for Chauncey Cruel, MD.   I, Lurline Del MD, have reviewed the above documentation for accuracy and completeness, and I agree with the above.

## 2018-10-26 ENCOUNTER — Other Ambulatory Visit: Payer: Self-pay

## 2018-10-26 ENCOUNTER — Encounter: Payer: Self-pay | Admitting: *Deleted

## 2018-10-26 ENCOUNTER — Inpatient Hospital Stay: Payer: PPO | Attending: Hematology | Admitting: Oncology

## 2018-10-26 DIAGNOSIS — M85862 Other specified disorders of bone density and structure, left lower leg: Secondary | ICD-10-CM | POA: Diagnosis not present

## 2018-10-26 DIAGNOSIS — Z170 Estrogen receptor positive status [ER+]: Secondary | ICD-10-CM | POA: Insufficient documentation

## 2018-10-26 DIAGNOSIS — C50112 Malignant neoplasm of central portion of left female breast: Secondary | ICD-10-CM

## 2018-10-26 DIAGNOSIS — Z8041 Family history of malignant neoplasm of ovary: Secondary | ICD-10-CM | POA: Diagnosis not present

## 2018-10-26 DIAGNOSIS — F329 Major depressive disorder, single episode, unspecified: Secondary | ICD-10-CM | POA: Insufficient documentation

## 2018-10-26 DIAGNOSIS — Z7982 Long term (current) use of aspirin: Secondary | ICD-10-CM | POA: Diagnosis not present

## 2018-10-26 DIAGNOSIS — M1990 Unspecified osteoarthritis, unspecified site: Secondary | ICD-10-CM | POA: Insufficient documentation

## 2018-10-26 DIAGNOSIS — Z8673 Personal history of transient ischemic attack (TIA), and cerebral infarction without residual deficits: Secondary | ICD-10-CM | POA: Diagnosis not present

## 2018-10-26 DIAGNOSIS — M85869 Other specified disorders of bone density and structure, unspecified lower leg: Secondary | ICD-10-CM | POA: Insufficient documentation

## 2018-10-26 DIAGNOSIS — Z79811 Long term (current) use of aromatase inhibitors: Secondary | ICD-10-CM | POA: Diagnosis not present

## 2018-10-26 DIAGNOSIS — I10 Essential (primary) hypertension: Secondary | ICD-10-CM | POA: Diagnosis not present

## 2018-10-26 DIAGNOSIS — M8580 Other specified disorders of bone density and structure, unspecified site: Secondary | ICD-10-CM | POA: Insufficient documentation

## 2018-10-26 DIAGNOSIS — Z79899 Other long term (current) drug therapy: Secondary | ICD-10-CM | POA: Insufficient documentation

## 2018-10-26 DIAGNOSIS — D0512 Intraductal carcinoma in situ of left breast: Secondary | ICD-10-CM | POA: Insufficient documentation

## 2018-10-26 DIAGNOSIS — E785 Hyperlipidemia, unspecified: Secondary | ICD-10-CM | POA: Diagnosis not present

## 2018-10-26 MED ORDER — ANASTROZOLE 1 MG PO TABS: 1.0000 mg | ORAL_TABLET | Freq: Every day | ORAL | 4 refills | Status: DC

## 2018-10-27 ENCOUNTER — Telehealth: Payer: Self-pay | Admitting: Oncology

## 2018-10-27 NOTE — Telephone Encounter (Signed)
I left a message regarding schedule  

## 2018-10-29 ENCOUNTER — Other Ambulatory Visit: Payer: Self-pay | Admitting: Genetic Counselor

## 2018-10-29 DIAGNOSIS — C50112 Malignant neoplasm of central portion of left female breast: Secondary | ICD-10-CM

## 2018-10-29 DIAGNOSIS — Z170 Estrogen receptor positive status [ER+]: Secondary | ICD-10-CM

## 2018-11-02 ENCOUNTER — Ambulatory Visit: Payer: PPO | Admitting: Hematology

## 2018-11-06 ENCOUNTER — Other Ambulatory Visit: Payer: Self-pay

## 2018-11-06 ENCOUNTER — Inpatient Hospital Stay: Payer: PPO

## 2018-11-06 DIAGNOSIS — Z8041 Family history of malignant neoplasm of ovary: Secondary | ICD-10-CM | POA: Diagnosis not present

## 2018-11-06 DIAGNOSIS — Z170 Estrogen receptor positive status [ER+]: Secondary | ICD-10-CM

## 2018-11-06 DIAGNOSIS — C50112 Malignant neoplasm of central portion of left female breast: Secondary | ICD-10-CM

## 2018-11-13 ENCOUNTER — Encounter: Payer: Self-pay | Admitting: Genetic Counselor

## 2018-11-13 DIAGNOSIS — Z1379 Encounter for other screening for genetic and chromosomal anomalies: Secondary | ICD-10-CM | POA: Insufficient documentation

## 2018-11-22 ENCOUNTER — Other Ambulatory Visit: Payer: Self-pay | Admitting: Oncology

## 2018-11-22 NOTE — Progress Notes (Unsigned)
In vitae diagnostic genetic results on the Common hereditary cancer panel collected 11/06/2018 found no pathogenic mutations in any of the genes analyzed

## 2018-11-25 ENCOUNTER — Telehealth: Payer: Self-pay | Admitting: *Deleted

## 2018-11-25 NOTE — Telephone Encounter (Signed)
This RN called pt regarding result of genetic testing - this RN also spoke with her daughter [name].  Pt understands plan is to continue taking Anastrazole and monitor tumor by [place] every 6 months with next expected date for [place] in January 2021.

## 2019-01-22 ENCOUNTER — Other Ambulatory Visit: Payer: PPO

## 2019-01-29 ENCOUNTER — Ambulatory Visit
Admission: RE | Admit: 2019-01-29 | Discharge: 2019-01-29 | Disposition: A | Discharge status: Home / Self Care | Payer: PPO | Source: Ambulatory Visit | Attending: Oncology | Admitting: Oncology

## 2019-01-29 ENCOUNTER — Other Ambulatory Visit: Payer: Self-pay

## 2019-01-29 ENCOUNTER — Encounter: Payer: Self-pay | Admitting: Oncology

## 2019-01-29 DIAGNOSIS — D492 Neoplasm of unspecified behavior of bone, soft tissue, and skin: Secondary | ICD-10-CM | POA: Diagnosis not present

## 2019-01-29 DIAGNOSIS — Z1389 Encounter for screening for other disorder: Secondary | ICD-10-CM | POA: Diagnosis not present

## 2019-01-29 DIAGNOSIS — F329 Major depressive disorder, single episode, unspecified: Secondary | ICD-10-CM | POA: Diagnosis not present

## 2019-01-29 DIAGNOSIS — R7303 Prediabetes: Secondary | ICD-10-CM | POA: Diagnosis not present

## 2019-01-29 DIAGNOSIS — C50912 Malignant neoplasm of unspecified site of left female breast: Secondary | ICD-10-CM | POA: Diagnosis not present

## 2019-01-29 DIAGNOSIS — I129 Hypertensive chronic kidney disease with stage 1 through stage 4 chronic kidney disease, or unspecified chronic kidney disease: Secondary | ICD-10-CM | POA: Diagnosis not present

## 2019-01-29 DIAGNOSIS — M1990 Unspecified osteoarthritis, unspecified site: Secondary | ICD-10-CM | POA: Diagnosis not present

## 2019-01-29 DIAGNOSIS — Z170 Estrogen receptor positive status [ER+]: Secondary | ICD-10-CM

## 2019-01-29 DIAGNOSIS — Z0000 Encounter for general adult medical examination without abnormal findings: Secondary | ICD-10-CM | POA: Diagnosis not present

## 2019-01-29 DIAGNOSIS — N183 Chronic kidney disease, stage 3 unspecified: Secondary | ICD-10-CM | POA: Diagnosis not present

## 2019-01-29 DIAGNOSIS — M8580 Other specified disorders of bone density and structure, unspecified site: Secondary | ICD-10-CM | POA: Diagnosis not present

## 2019-01-29 DIAGNOSIS — E782 Mixed hyperlipidemia: Secondary | ICD-10-CM | POA: Diagnosis not present

## 2019-01-29 DIAGNOSIS — F0390 Unspecified dementia without behavioral disturbance: Secondary | ICD-10-CM | POA: Diagnosis not present

## 2019-01-29 DIAGNOSIS — C50112 Malignant neoplasm of central portion of left female breast: Secondary | ICD-10-CM

## 2019-02-12 DIAGNOSIS — N390 Urinary tract infection, site not specified: Secondary | ICD-10-CM | POA: Diagnosis not present

## 2019-02-12 DIAGNOSIS — R300 Dysuria: Secondary | ICD-10-CM | POA: Diagnosis not present

## 2019-02-12 DIAGNOSIS — S7002XA Contusion of left hip, initial encounter: Secondary | ICD-10-CM | POA: Diagnosis not present

## 2019-02-12 DIAGNOSIS — W19XXXA Unspecified fall, initial encounter: Secondary | ICD-10-CM | POA: Diagnosis not present

## 2019-02-12 DIAGNOSIS — S8012XA Contusion of left lower leg, initial encounter: Secondary | ICD-10-CM | POA: Diagnosis not present

## 2019-03-13 DIAGNOSIS — S50812A Abrasion of left forearm, initial encounter: Secondary | ICD-10-CM | POA: Diagnosis not present

## 2019-03-13 DIAGNOSIS — R0781 Pleurodynia: Secondary | ICD-10-CM | POA: Diagnosis not present

## 2019-03-13 DIAGNOSIS — W19XXXA Unspecified fall, initial encounter: Secondary | ICD-10-CM | POA: Diagnosis not present

## 2019-03-26 DIAGNOSIS — L821 Other seborrheic keratosis: Secondary | ICD-10-CM | POA: Diagnosis not present

## 2019-03-26 DIAGNOSIS — L280 Lichen simplex chronicus: Secondary | ICD-10-CM | POA: Diagnosis not present

## 2019-03-26 DIAGNOSIS — L570 Actinic keratosis: Secondary | ICD-10-CM | POA: Diagnosis not present

## 2019-04-26 ENCOUNTER — Other Ambulatory Visit: Payer: Self-pay | Admitting: *Deleted

## 2019-04-26 DIAGNOSIS — C50112 Malignant neoplasm of central portion of left female breast: Secondary | ICD-10-CM

## 2019-04-26 DIAGNOSIS — Z170 Estrogen receptor positive status [ER+]: Secondary | ICD-10-CM

## 2019-04-26 NOTE — Progress Notes (Signed)
[facility]  [phone] [phone]    ID: [name] DOB: [birth year]  MR#: [id number]  KTG#:[id number]  Patient Care Team: Kelton Pillar, MD as PCP - General (Family Medicine) Vickey Huger, MD as Attending Physician (Orthopedic Surgery) Zeplin Aleshire, Virgie Dad, MD as Consulting Physician (Oncology) OTHER MD:   CHIEF COMPLAINT: estrogen receptor positive breast cancer  CURRENT TREATMENT: anastrozole; zoledronate   INTERVAL HISTORY: [name] returns today for follow up of her estrogen receptor positive breast cancer accompanied by her daughter [name]. She was evaluated in the breast cancer clinic on 10/26/2018.   She was started on anastrozole at breast clinic.  She is tolerating this remarkably well, with no hot flashes or vaginal dryness or other issues of concern.  She obtains the drug at a very good price.  She is scheduled to begin yearly zolendronate today.  This is discussed further below.  Since her last visit, she underwent left breast ultrasound on 01/29/2019, which showed: slight interval decrease in size, from 8 mm to 5 mm, of left breast mass.   REVIEW OF SYSTEMS: Taniqua is very careful regarding falls and every day prays that God will allow her to have another day without falls.  She has a bad right knee which slows her down.  She still lives by herself.  She says mostly she sits at home and watches TV.  For pain she takes Tylenol.  There have been no intercurrent infections.  She "cannot wait for the virus to go away" so she can have more of a social life.  A detailed review of systems today was otherwise stable.   HISTORY OF CURRENT ILLNESS: From the original intake note:  [name] had routine screening mammography on 09/28/2018 showing a possible abnormality in the left breast. She underwent unilateral left diagnostic mammography with tomography and left breast ultrasonography at [facility] on 10/05/2018 showing:  Breast Density Category B. Spot compression views with tomography of the 6 o'clock axis of the left breast show a dense irregular mass with indistinct margins. On physical exam, no mass is palpated in the 6 o'clock axis of the left breast. The skin appears normal. Targeted ultrasound is performed, showing an irregular heterogeneously hypoechoic mass with peripheral echogenic rim at 6 o'clock position 3 cm from the nipple. The mass is taller than wide and measures 8 x 8 x 6 mm. Vascular flow is in the superficial aspect of the mass. Ultrasound of the left axilla is negative for lymphadenopathy.  Accordingly on 10/09/2018 she proceeded to biopsy of the left breast area in question. The pathology from this procedure showed ([id number]): invasive ductal carcinoma, Nottingham grade 2 of 3. Prognostic indicators significant for: estrogen receptor, 100% positive and progesterone receptor, 100% positive, both with strong staining intensity. Proliferation marker Ki67 at 10%. HER2 negative (1+) by immunohistochemistry.  The patient's subsequent history is as detailed below.    PAST MEDICAL HISTORY: Past Medical History:  Diagnosis Date  . Arthritis 04/13/09   Right knee  . Carotid artery occlusion July 2005  . Depression   . Hyperlipidemia   . Hypertension   . Stroke [facility]) July 2005   Mini    PAST SURGICAL HISTORY: Past Surgical History:  Procedure Laterality Date  . CATARACT EXTRACTION W/ INTRAOCULAR LENS  IMPLANT, BILATERAL      FAMILY HISTORY: Family History  Problem Relation Age of Onset  . Heart disease Brother   . Coronary artery disease Brother   . Heart disease  Brother   . Heart disease Brother   . Ovarian cancer Daughter 46       Died at 7  . Breast cancer Neg Hx    [name]'s father died at age 3, sudden death. Patients' mother died at age 21 from "natural causes".. The patient has 3 brothers and 3 sisters. Patient denies anyone in her family having breast, prostate, or  pancreatic cancer. [name]'s oldest daughter was diagnosed with ovarian cancer at the age of 72, and passed at the age of 74.    GYNECOLOGIC HISTORY:  No LMP recorded. Patient is postmenopausal. Menarche: 86 years old Age at first live birth: 86 years old [place] P: 3 LMP: 9 HRT: No  Hysterectomy?:  No BSO?: no    SOCIAL HISTORY: (Current as of 10/26/2018) [name] used to work in the [employer].  She is widowed and lives by herself.  Her daughter Butch Penny as noted above died from ovarian cancer at age 27.  Daughter [name], 84, is Network engineer at [employer] across the street from the hospital.  Annie Sable 67 is a retired Agricultural consultant.  The patient has 3 grandchildren and 2 great-grandchildren.  She attends a [employer]   ADVANCED DIRECTIVES:     HEALTH MAINTENANCE: Social History   Tobacco Use  . Smoking status: Never Smoker  . Smokeless tobacco: Former Systems developer    Types: Snuff  Substance Use Topics  . Alcohol use: No    Alcohol/week: 0.0 standard drinks  . Drug use: No    Bone density: at Dr. Delene Ruffini 09/12/2011 showed a T score of -1.9   Allergies  Allergen Reactions  . Penicillins   . Sulfa Antibiotics   . Codeine Other (See Comments)  . Penicillin G Rash  . Prednisone Palpitations  . Sulfamethoxazole-Trimethoprim Rash    Current Outpatient Medications  Medication Sig Dispense Refill  . amLODipine (NORVASC) 5 MG tablet Take 5 mg by mouth daily.     Marland Kitchen anastrozole (ARIMIDEX) 1 MG tablet Take 1 tablet (1 mg total) by mouth daily. 90 tablet 4  . aspirin 81 MG tablet Take 81 mg by mouth daily.    Marland Kitchen atorvastatin (LIPITOR) 20 MG tablet Take 20 mg by mouth daily.     . clonazePAM (KLONOPIN) 0.5 MG tablet Take 0.5 mg by mouth 2 (two) times daily.     . metoprolol tartrate (LOPRESSOR) 25 MG tablet Take 25 mg by mouth daily.     Marland Kitchen PARoxetine (PAXIL) 30 MG tablet Take 30 mg by mouth daily.     Marland Kitchen saccharomyces boulardii (FLORASTOR) 250 MG capsule Take 250 mg by  mouth daily.    . traMADol (ULTRAM) 50 MG tablet Take 50 mg by mouth every 6 (six) hours as needed for moderate pain.     No current facility-administered medications for this visit.    OBJECTIVE: white woman in no acute distress  Vitals:   04/27/19 1325  BP: 134/63  Pulse: 60  Resp: 18  Temp: 98.3 F (36.8 C)  SpO2: 95%   Wt Readings from Last 3 Encounters:  04/27/19 158 lb 4.8 oz (71.8 kg)  10/26/18 156 lb (70.8 kg)  10/16/18 157 lb (71.2 kg)   Body mass index is 26.34 kg/m.    ECOG FS:2 - Symptomatic, <50% confined to bed  Sclerae unicteric, EOMs intact Wearing a mask No cervical or supraclavicular adenopathy Lungs no rales or rhonchi Heart regular rate and rhythm Abd soft, nontender, positive bowel sounds MSK kyphosis but  no focal spinal tenderness, no upper extremity lymphedema Neuro: nonfocal, well oriented, appropriate affect Breasts: The right breast is benign.  I do not palpate a mass in the left breast.  There are no skin or nipple changes of concern.  Both axillae are benign.   LAB RESULTS:  CMP     Component Value Date/Time   NA 140 04/27/2019 1250   K 4.2 04/27/2019 1250   CL 109 04/27/2019 1250   CO2 22 04/27/2019 1250   GLUCOSE 127 (H) 04/27/2019 1250   BUN 30 (H) 04/27/2019 1250   CREATININE 1.26 (H) 04/27/2019 1250   CALCIUM 9.4 04/27/2019 1250   PROT 7.4 04/27/2019 1250   ALBUMIN 3.6 04/27/2019 1250   AST 14 (L) 04/27/2019 1250   ALT 12 04/27/2019 1250   ALKPHOS 88 04/27/2019 1250   BILITOT 0.3 04/27/2019 1250   GFRNONAA 37 (L) 04/27/2019 1250   GFRAA 43 (L) 04/27/2019 1250    No results found for: TOTALPROTELP, ALBUMINELP, A1GS, A2GS, BETS, BETA2SER, GAMS, MSPIKE, SPEI  No results found for: KPAFRELGTCHN, LAMBDASER, KAPLAMBRATIO  Lab Results  Component Value Date   WBC 8.1 04/27/2019   NEUTROABS 4.6 04/27/2019   HGB 11.7 (L) 04/27/2019   HCT 36.1 04/27/2019   MCV 104.6 (H) 04/27/2019   PLT 213 04/27/2019    No results found  for: LABCA2  No components found for: SVXBLT903  No results for input(s): INR in the last 168 hours.  No results found for: LABCA2  No results found for: ESP233  No results found for: AQT622  No results found for: QJF354  No results found for: CA2729  No components found for: HGQUANT  No results found for: CEA1 / No results found for: CEA1   No results found for: AFPTUMOR  No results found for: CHROMOGRNA  No results found for: HGBA, HGBA2QUANT, HGBFQUANT, HGBSQUAN (Hemoglobinopathy evaluation)   No results found for: LDH  No results found for: IRON, TIBC, IRONPCTSAT (Iron and TIBC)  No results found for: FERRITIN  Urinalysis    Component Value Date/Time   COLORURINE YELLOW (A) 07/27/2018 1056   APPEARANCEUR CLEAR (A) 07/27/2018 1056   LABSPEC 1.015 07/27/2018 1056   PHURINE 5.0 07/27/2018 1056   GLUCOSEU NEGATIVE 07/27/2018 [place] 07/27/2018 1056   BILIRUBINUR NEGATIVE 07/27/2018 1056   KETONESUR NEGATIVE 07/27/2018 [place] 07/27/2018 1056   NITRITE NEGATIVE 07/27/2018 1056   LEUKOCYTESUR NEGATIVE 07/27/2018 1056    STUDIES:  No results found.   ELIGIBLE FOR AVAILABLE RESEARCH PROTOCOL: no   ASSESSMENT: 86 y.o. [place], [place] woman status post left central breast biopsy 10/09/2018 for a clinical T1b N0, stage IA days of ductal carcinoma, grade 2, estrogen and progesterone receptor strongly positive, with an MIB-1 of 10%, and no HER-2 amplification.  (1) anastrozole started 10/26/2018  (a) left breast ultrasound 01/29/2019 shows evidence of response  (2) osteopenia/osteoporosis:   (a) bone density 09/12/2011 at [facility] shows a T score of -1.9  (b) started yearly zoledronate April 2021   PLAN:  Nalah is tolerating anastrozole well and we have evidence that it is working for her.  Clinically today there is no palpable disease in the left breast.  The one concern with this drug is that it may worsen the  patient has osteoporosis.  We are starting zoledronate today.  I have dropped the dose some given the patient's age and GFR.  We discussed the possible toxicities side effects and complications including the  very rare cases of osteonecrosis of the jaw.  The patient has been advised to keep routine dental appointments and her daughter will be facilitating this.  Today the patient will take Tums a total of 4 pills and she will do that the next 2 days as well.  If she has bony aches which sometimes occurs with this infusion as she will take some Tylenol and if that does not take care of it she will call us.  Otherwise she will have repeat mammography and ultrasonography September of this year and she will see me again in October.  Total encounter time 30 minutes.*  Ramya Vanbergen, Virgie Dad, MD  04/27/19 1:57 PM Medical Oncology and Hematology [facility] [place], [place] [address] Tel. [phone]    Fax. [phone]    I, Wilburn Mylar, am acting as scribe for Dr. Virgie Dad. Nehemiah Mcfarren.  I, Lurline Del MD, have reviewed the above documentation for accuracy and completeness, and I agree with the above.    *Total Encounter Time as defined by the Centers for Medicare and Medicaid Services includes, in addition to the face-to-face time of a patient visit (documented in the note above) non-face-to-face time: obtaining and reviewing outside history, ordering and reviewing medications, tests or procedures, care coordination (communications with other health care professionals or caregivers) and documentation in the medical record.

## 2019-04-27 ENCOUNTER — Inpatient Hospital Stay: Payer: PPO | Attending: Oncology

## 2019-04-27 ENCOUNTER — Inpatient Hospital Stay: Payer: PPO

## 2019-04-27 ENCOUNTER — Inpatient Hospital Stay (HOSPITAL_BASED_OUTPATIENT_CLINIC_OR_DEPARTMENT_OTHER): Payer: PPO | Admitting: Oncology

## 2019-04-27 ENCOUNTER — Other Ambulatory Visit: Payer: Self-pay

## 2019-04-27 VITALS — BP 134/63 | HR 60 | Temp 98.3°F | Resp 18 | Ht 65.0 in | Wt 158.3 lb

## 2019-04-27 DIAGNOSIS — C50112 Malignant neoplasm of central portion of left female breast: Secondary | ICD-10-CM | POA: Diagnosis not present

## 2019-04-27 DIAGNOSIS — Z170 Estrogen receptor positive status [ER+]: Secondary | ICD-10-CM

## 2019-04-27 DIAGNOSIS — C50812 Malignant neoplasm of overlapping sites of left female breast: Secondary | ICD-10-CM | POA: Insufficient documentation

## 2019-04-27 DIAGNOSIS — Z79811 Long term (current) use of aromatase inhibitors: Secondary | ICD-10-CM | POA: Diagnosis not present

## 2019-04-27 DIAGNOSIS — M85861 Other specified disorders of bone density and structure, right lower leg: Secondary | ICD-10-CM | POA: Diagnosis not present

## 2019-04-27 DIAGNOSIS — M810 Age-related osteoporosis without current pathological fracture: Secondary | ICD-10-CM | POA: Insufficient documentation

## 2019-04-27 DIAGNOSIS — M85862 Other specified disorders of bone density and structure, left lower leg: Secondary | ICD-10-CM

## 2019-04-27 LAB — CBC WITH DIFFERENTIAL (CANCER CENTER ONLY)
Abs Immature Granulocytes: 0.03 10*3/uL (ref 0.00–0.07)
Basophils Absolute: 0 10*3/uL (ref 0.0–0.1)
Basophils Relative: 0 %
Eosinophils Absolute: 0.3 10*3/uL (ref 0.0–0.5)
Eosinophils Relative: 4 %
HCT: 36.1 % (ref 36.0–46.0)
Hemoglobin: 11.7 g/dL — ABNORMAL LOW (ref 12.0–15.0)
Immature Granulocytes: 0 %
Lymphocytes Relative: 33 %
Lymphs Abs: 2.7 10*3/uL (ref 0.7–4.0)
MCH: 33.9 pg (ref 26.0–34.0)
MCHC: 32.4 g/dL (ref 30.0–36.0)
MCV: 104.6 fL — ABNORMAL HIGH (ref 80.0–100.0)
Monocytes Absolute: 0.5 10*3/uL (ref 0.1–1.0)
Monocytes Relative: 6 %
Neutro Abs: 4.6 10*3/uL (ref 1.7–7.7)
Neutrophils Relative %: 57 %
Platelet Count: 213 10*3/uL (ref 150–400)
RBC: 3.45 MIL/uL — ABNORMAL LOW (ref 3.87–5.11)
RDW: 11.7 % (ref 11.5–15.5)
WBC Count: 8.1 10*3/uL (ref 4.0–10.5)
nRBC: 0 % (ref 0.0–0.2)

## 2019-04-27 LAB — CMP (CANCER CENTER ONLY)
ALT: 12 U/L (ref 0–44)
AST: 14 U/L — ABNORMAL LOW (ref 15–41)
Albumin: 3.6 g/dL (ref 3.5–5.0)
Alkaline Phosphatase: 88 U/L (ref 38–126)
Anion gap: 9 (ref 5–15)
BUN: 30 mg/dL — ABNORMAL HIGH (ref 8–23)
CO2: 22 mmol/L (ref 22–32)
Calcium: 9.4 mg/dL (ref 8.9–10.3)
Chloride: 109 mmol/L (ref 98–111)
Creatinine: 1.26 mg/dL — ABNORMAL HIGH (ref 0.44–1.00)
GFR, Est AFR Am: 43 mL/min — ABNORMAL LOW (ref >60)
GFR, Estimated: 37 mL/min — ABNORMAL LOW (ref >60)
Glucose, Bld: 127 mg/dL — ABNORMAL HIGH (ref 70–99)
Potassium: 4.2 mmol/L (ref 3.5–5.1)
Sodium: 140 mmol/L (ref 135–145)
Total Bilirubin: 0.3 mg/dL (ref 0.3–1.2)
Total Protein: 7.4 g/dL (ref 6.5–8.1)

## 2019-04-27 MED ORDER — ZOLEDRONIC ACID 4 MG/5ML IV CONC
3.0000 mg | Freq: Once | INTRAVENOUS | Status: AC
  Administered 2019-04-27: 3 mg via INTRAVENOUS
  Filled 2019-04-27: qty 3.75

## 2019-04-27 NOTE — Patient Instructions (Signed)
Zoledronic Acid injection (Hypercalcemia, Oncology) What is this medicine? ZOLEDRONIC ACID (ZOE le dron ik AS id) lowers the amount of calcium loss from bone. It is used to treat too much calcium in your blood from cancer. It is also used to prevent complications of cancer that has spread to the bone. This medicine may be used for other purposes; ask your health care provider or pharmacist if you have questions. COMMON BRAND NAME(S): Zometa What should I tell my health care provider before I take this medicine? They need to know if you have any of these conditions:  aspirin-sensitive asthma  cancer, especially if you are receiving medicines used to treat cancer  dental disease or wear dentures  infection  kidney disease  receiving corticosteroids like dexamethasone or prednisone  an unusual or allergic reaction to zoledronic acid, other medicines, foods, dyes, or preservatives  pregnant or trying to get pregnant  breast-feeding How should I use this medicine? This medicine is for infusion into a vein. It is given by a health care professional in a hospital or clinic setting. Talk to your pediatrician regarding the use of this medicine in children. Special care may be needed. Overdosage: If you think you have taken too much of this medicine contact a poison control center or emergency room at once. NOTE: This medicine is only for you. Do not share this medicine with others. What if I miss a dose? It is important not to miss your dose. Call your doctor or health care professional if you are unable to keep an appointment. What may interact with this medicine?  certain antibiotics given by injection  NSAIDs, medicines for pain and inflammation, like ibuprofen or naproxen  some diuretics like bumetanide, furosemide  teriparatide  thalidomide This list may not describe all possible interactions. Give your health care provider a list of all the medicines, herbs, non-prescription  drugs, or dietary supplements you use. Also tell them if you smoke, drink alcohol, or use illegal drugs. Some items may interact with your medicine. What should I watch for while using this medicine? Visit your doctor or health care professional for regular checkups. It may be some time before you see the benefit from this medicine. Do not stop taking your medicine unless your doctor tells you to. Your doctor may order blood tests or other tests to see how you are doing. Women should inform their doctor if they wish to become pregnant or think they might be pregnant. There is a potential for serious side effects to an unborn child. Talk to your health care professional or pharmacist for more information. You should make sure that you get enough calcium and vitamin D while you are taking this medicine. Discuss the foods you eat and the vitamins you take with your health care professional. Some people who take this medicine have severe bone, joint, and/or muscle pain. This medicine may also increase your risk for jaw problems or a broken thigh bone. Tell your doctor right away if you have severe pain in your jaw, bones, joints, or muscles. Tell your doctor if you have any pain that does not go away or that gets worse. Tell your dentist and dental surgeon that you are taking this medicine. You should not have major dental surgery while on this medicine. See your dentist to have a dental exam and fix any dental problems before starting this medicine. Take good care of your teeth while on this medicine. Make sure you see your dentist for regular follow-up   appointments. What side effects may I notice from receiving this medicine? Side effects that you should report to your doctor or health care professional as soon as possible:  allergic reactions like skin rash, itching or hives, swelling of the face, lips, or tongue  anxiety, confusion, or depression  breathing problems  changes in vision  eye  pain  feeling faint or lightheaded, falls  jaw pain, especially after dental work  mouth sores  muscle cramps, stiffness, or weakness  redness, blistering, peeling or loosening of the skin, including inside the mouth  trouble passing urine or change in the amount of urine Side effects that usually do not require medical attention (report to your doctor or health care professional if they continue or are bothersome):  bone, joint, or muscle pain  constipation  diarrhea  fever  hair loss  irritation at site where injected  loss of appetite  nausea, vomiting  stomach upset  trouble sleeping  trouble swallowing  weak or tired This list may not describe all possible side effects. Call your doctor for medical advice about side effects. You may report side effects to FDA at 1-800-FDA-1088. Where should I keep my medicine? This drug is given in a hospital or clinic and will not be stored at home. NOTE: This sheet is a summary. It may not cover all possible information. If you have questions about this medicine, talk to your doctor, pharmacist, or health care provider.  2020 Elsevier/Gold Standard (2013-05-29 14:19:39)  

## 2019-04-28 ENCOUNTER — Telehealth: Payer: Self-pay | Admitting: Oncology

## 2019-04-28 NOTE — Telephone Encounter (Signed)
Scheduled appts per 4/13 los. Pt's daughter confirmed appt date and time. Pt's daughter requested an appt reminder and calender via mail, msg was sent to HIM.

## 2019-07-30 DIAGNOSIS — C50912 Malignant neoplasm of unspecified site of left female breast: Secondary | ICD-10-CM | POA: Diagnosis not present

## 2019-07-30 DIAGNOSIS — I129 Hypertensive chronic kidney disease with stage 1 through stage 4 chronic kidney disease, or unspecified chronic kidney disease: Secondary | ICD-10-CM | POA: Diagnosis not present

## 2019-07-30 DIAGNOSIS — R7303 Prediabetes: Secondary | ICD-10-CM | POA: Diagnosis not present

## 2019-07-30 DIAGNOSIS — F0390 Unspecified dementia without behavioral disturbance: Secondary | ICD-10-CM | POA: Diagnosis not present

## 2019-07-30 DIAGNOSIS — N183 Chronic kidney disease, stage 3 unspecified: Secondary | ICD-10-CM | POA: Diagnosis not present

## 2019-07-30 DIAGNOSIS — E782 Mixed hyperlipidemia: Secondary | ICD-10-CM | POA: Diagnosis not present

## 2019-07-30 DIAGNOSIS — E46 Unspecified protein-calorie malnutrition: Secondary | ICD-10-CM | POA: Diagnosis not present

## 2019-10-01 ENCOUNTER — Ambulatory Visit
Admission: RE | Admit: 2019-10-01 | Discharge: 2019-10-01 | Disposition: A | Discharge status: Home / Self Care | Payer: PPO | Source: Ambulatory Visit | Attending: Oncology | Admitting: Oncology

## 2019-10-01 ENCOUNTER — Other Ambulatory Visit: Payer: Self-pay

## 2019-10-01 DIAGNOSIS — C50112 Malignant neoplasm of central portion of left female breast: Secondary | ICD-10-CM

## 2019-10-01 DIAGNOSIS — R928 Other abnormal and inconclusive findings on diagnostic imaging of breast: Secondary | ICD-10-CM | POA: Diagnosis not present

## 2019-10-01 DIAGNOSIS — C50912 Malignant neoplasm of unspecified site of left female breast: Secondary | ICD-10-CM | POA: Diagnosis not present

## 2019-10-01 DIAGNOSIS — M85861 Other specified disorders of bone density and structure, right lower leg: Secondary | ICD-10-CM

## 2019-10-01 DIAGNOSIS — Z170 Estrogen receptor positive status [ER+]: Secondary | ICD-10-CM

## 2019-10-04 DIAGNOSIS — I781 Nevus, non-neoplastic: Secondary | ICD-10-CM | POA: Diagnosis not present

## 2019-10-04 DIAGNOSIS — L821 Other seborrheic keratosis: Secondary | ICD-10-CM | POA: Diagnosis not present

## 2019-10-15 ENCOUNTER — Other Ambulatory Visit: Payer: Self-pay | Admitting: *Deleted

## 2019-10-15 DIAGNOSIS — I6521 Occlusion and stenosis of right carotid artery: Secondary | ICD-10-CM

## 2019-10-18 ENCOUNTER — Other Ambulatory Visit: Payer: Self-pay | Admitting: *Deleted

## 2019-10-18 DIAGNOSIS — C50112 Malignant neoplasm of central portion of left female breast: Secondary | ICD-10-CM

## 2019-10-18 DIAGNOSIS — Z170 Estrogen receptor positive status [ER+]: Secondary | ICD-10-CM

## 2019-10-18 NOTE — Progress Notes (Signed)
[place]  [phone] [phone]) [phone]    ID: JERE VANBUREN DOB: 01-11-1925  MR#: [id number]  WIO#:[id number]  Patient Care Team: Kelton Pillar, MD as PCP - General (Family Medicine) Vickey Huger, MD as Attending Physician (Orthopedic Surgery) Payten Hobin, Virgie Dad, MD as Consulting Physician (Oncology) OTHER MD:   CHIEF COMPLAINT: estrogen receptor positive breast cancer  CURRENT TREATMENT: anastrozole; zoledronate   INTERVAL HISTORY: Neema returns today for follow up of her estrogen receptor positive breast cancer accompanied by her daughter [name].   She was started on anastrozole at breast clinic.  S she has no side effects from the medication that she is aware of.  She began yearly zoledronate at her last visit on 04/27/2019.  She had no complications from this that she is aware of  Since her last visit, she underwent bilateral diagnostic mammography with tomography and left breast ultrasonography at [facility] on 10/01/2019 showing: breast density category B; mammographic evaluation markedly limited due to patient's inability to tolerate optimal positioning; interval improvement at site of patient's known left breast cancer with no residual mass seen mammographically or sonographically; no evidence of right breast malignancy.  Of note, she is scheduled for repeat carotid vascular ultrasound on 11/05/2019.   REVIEW OF SYSTEMS: Pearlena tells me she sits all day and spends the whole day eating.  She would like to be more active.  She does not want to use her Rollator.  She uses a cane instead.  There have been no actual falls but she has fallen from her bed a couple of times.  We discussed getting a mattress to the side of the bed so she falls on it she will not get injured or putting a barrier on the side of the bed.  Aside from these issues a detailed review of systems today was stable   HISTORY OF CURRENT ILLNESS: From the original  intake note:  TAREVA LESKE had routine screening mammography on 09/28/2018 showing a possible abnormality in the left breast. She underwent unilateral left diagnostic mammography with tomography and left breast ultrasonography at [facility] on 10/05/2018 showing: Breast Density Category B. Spot compression views with tomography of the 6 o'clock axis of the left breast show a dense irregular mass with indistinct margins. On physical exam, no mass is palpated in the 6 o'clock axis of the left breast. The skin appears normal. Targeted ultrasound is performed, showing an irregular heterogeneously hypoechoic mass with peripheral echogenic rim at 6 o'clock position 3 cm from the nipple. The mass is taller than wide and measures 8 x 8 x 6 mm. Vascular flow is in the superficial aspect of the mass. Ultrasound of the left axilla is negative for lymphadenopathy.  Accordingly on 10/09/2018 she proceeded to biopsy of the left breast area in question. The pathology from this procedure showed ([id number]): invasive ductal carcinoma, Nottingham grade 2 of 3. Prognostic indicators significant for: estrogen receptor, 100% positive and progesterone receptor, 100% positive, both with strong staining intensity. Proliferation marker Ki67 at 10%. HER2 negative (1+) by immunohistochemistry.  The patient's subsequent history is as detailed below.    PAST MEDICAL HISTORY: Past Medical History:  Diagnosis Date  . Arthritis 04/13/09   Right knee  . Carotid artery occlusion July 2005  . Depression   . Hyperlipidemia   . Hypertension   . Stroke [facility]) July 2005   Mini    PAST SURGICAL HISTORY: Past Surgical History:  Procedure Laterality Date  . CATARACT  EXTRACTION W/ INTRAOCULAR LENS  IMPLANT, BILATERAL      FAMILY HISTORY: Family History  Problem Relation Age of Onset  . Heart disease Brother   . Coronary artery disease Brother   . Heart disease Brother   . Heart disease Brother   . Ovarian cancer  Daughter 58       Died at 29  . Breast cancer Neg Hx    Candas's father died at age 84, sudden death. Patients' mother died at age 57 from "natural causes".. The patient has 3 brothers and 3 sisters. Patient denies anyone in her family having breast, prostate, or pancreatic cancer. Triniti's oldest daughter was diagnosed with ovarian cancer at the age of 90, and passed at the age of 73.    GYNECOLOGIC HISTORY:  No LMP recorded. Patient is postmenopausal. Menarche: 86 years old Age at first live birth: 86 years old Industry P: 3 LMP: 9 HRT: No  Hysterectomy?:  No BSO?: no    SOCIAL HISTORY: (Current as of 10/26/2018) Zamora used to work in the [employer].  She is widowed and lives by herself.  Her daughter Butch Penny as noted above died from ovarian cancer at age 3.  Daughter [name], 47, is Network engineer at [employer] across the street from the hospital.  Annie Sable 67 is a retired Agricultural consultant.  The patient has 3 grandchildren and 2 great-grandchildren.  She attends a [employer]   ADVANCED DIRECTIVES:     HEALTH MAINTENANCE: Social History   Tobacco Use  . Smoking status: Never Smoker  . Smokeless tobacco: Former Systems developer    Types: Snuff  Vaping Use  . Vaping Use: Never used  Substance Use Topics  . Alcohol use: No    Alcohol/week: 0.0 standard drinks  . Drug use: No    Bone density: at Dr. Delene Ruffini 09/12/2011 showed a T score of -1.9   Allergies  Allergen Reactions  . Penicillins   . Sulfa Antibiotics   . Codeine Other (See Comments)  . Penicillin G Rash  . Prednisone Palpitations  . Sulfamethoxazole-Trimethoprim Rash    Current Outpatient Medications  Medication Sig Dispense Refill  . amLODipine (NORVASC) 5 MG tablet Take 5 mg by mouth daily.     Marland Kitchen anastrozole (ARIMIDEX) 1 MG tablet Take 1 tablet (1 mg total) by mouth daily. 90 tablet 4  . aspirin 81 MG tablet Take 81 mg by mouth daily.    Marland Kitchen atorvastatin (LIPITOR) 20 MG tablet Take 20 mg by mouth  daily.     . clonazePAM (KLONOPIN) 0.5 MG tablet Take 0.5 mg by mouth 2 (two) times daily.     . metoprolol tartrate (LOPRESSOR) 25 MG tablet Take 25 mg by mouth daily.     Marland Kitchen PARoxetine (PAXIL) 30 MG tablet Take 30 mg by mouth daily.     Marland Kitchen saccharomyces boulardii (FLORASTOR) 250 MG capsule Take 250 mg by mouth daily.    . traMADol (ULTRAM) 50 MG tablet Take 50 mg by mouth every 6 (six) hours as needed for moderate pain.     No current facility-administered medications for this visit.    OBJECTIVE: white woman examined in a wheelchair  Vitals:   10/19/19 1322  BP: (!) 149/64  Pulse: (!) 55  Resp: 18  Temp: (!) 97.2 F (36.2 C)  SpO2: 100%   Wt Readings from Last 3 Encounters:  10/19/19 153 lb 6.4 oz (69.6 kg)  04/27/19 158 lb 4.8 oz (71.8 kg)  10/26/18 156 lb (  70.8 kg)   Body mass index is 25.53 kg/m.    ECOG FS:2 - Symptomatic, <50% confined to bed  Wearing a mask No cervical or supraclavicular adenopathy Lungs no rales or rhonchi Heart regular rate and rhythm Abd soft, nontender, positive bowel sounds MSK no focal spinal tenderness Neuro: nonfocal, well oriented, appropriate affect Breasts: I do not palpate a mass in the right breast.  There are no skin or nipple changes of concern.  The left breast is benign.  Both axillae are benign.   LAB RESULTS:  CMP     Component Value Date/Time   NA 140 10/19/2019 1244   K 5.4 (H) 10/19/2019 1244   CL 109 10/19/2019 1244   CO2 28 10/19/2019 1244   GLUCOSE 95 10/19/2019 1244   BUN 35 (H) 10/19/2019 1244   CREATININE 1.18 (H) 10/19/2019 1244   CALCIUM 9.6 10/19/2019 1244   PROT 7.7 10/19/2019 1244   ALBUMIN 3.6 10/19/2019 1244   AST 17 10/19/2019 1244   ALT 12 10/19/2019 1244   ALKPHOS 68 10/19/2019 1244   BILITOT 0.3 10/19/2019 1244   GFRNONAA 40 (L) 10/19/2019 1244   GFRAA 43 (L) 04/27/2019 1250    No results found for: TOTALPROTELP, ALBUMINELP, A1GS, A2GS, BETS, BETA2SER, GAMS, MSPIKE, SPEI  No results found  for: KPAFRELGTCHN, LAMBDASER, KAPLAMBRATIO  Lab Results  Component Value Date   WBC 8.0 10/19/2019   NEUTROABS 4.9 10/19/2019   HGB 11.2 (L) 10/19/2019   HCT 34.4 (L) 10/19/2019   MCV 102.7 (H) 10/19/2019   PLT 225 10/19/2019    No results found for: LABCA2  No components found for: [id number]  No results for input(s): INR in the last 168 hours.  No results found for: LABCA2  No results found for: RFF638  No results found for: GYK599  No results found for: JTT017  No results found for: CA2729  No components found for: HGQUANT  No results found for: CEA1 / No results found for: CEA1   No results found for: AFPTUMOR  No results found for: CHROMOGRNA  No results found for: HGBA, HGBA2QUANT, HGBFQUANT, HGBSQUAN (Hemoglobinopathy evaluation)   No results found for: LDH  No results found for: IRON, TIBC, IRONPCTSAT (Iron and TIBC)  No results found for: FERRITIN  Urinalysis    Component Value Date/Time   COLORURINE YELLOW (A) 07/27/2018 1056   APPEARANCEUR CLEAR (A) 07/27/2018 1056   LABSPEC 1.015 07/27/2018 1056   PHURINE 5.0 07/27/2018 1056   GLUCOSEU NEGATIVE 07/27/2018 1056   HGBUR NEGATIVE 07/27/2018 1056   BILIRUBINUR NEGATIVE 07/27/2018 1056   KETONESUR NEGATIVE 07/27/2018 [place] 07/27/2018 1056   NITRITE NEGATIVE 07/27/2018 1056   LEUKOCYTESUR NEGATIVE 07/27/2018 1056    STUDIES:  US BREAST LTD UNI LEFT INC AXILLA  Result Date: 10/01/2019 CLINICAL DATA:  86 year old female presenting for follow-up of a known left breast cancer. Please note, mammographic evaluation is markedly limited due to the patient's inability to tolerate optimal positioning. The best possible images were obtained. EXAM: DIGITAL DIAGNOSTIC BILATERAL MAMMOGRAM WITH CAD AND TOMO ULTRASOUND LEFT BREAST COMPARISON:  Previous exam(s). ACR Breast Density Category b: There are scattered areas of fibroglandular density. FINDINGS: Post biopsy clip is again identified in  the central left breast. Previously demonstrated mass in this region is no longer well seen. No additional suspicious findings are identified in the remainder of either breast. Mammographic images were processed with CAD. Targeted ultrasound is performed, showing interval resolution of the previously identified mass at  the 6 o'clock position 3 cm from the nipple. The associated tissue in landmarks are identified in this region, but the mass is no longer seen. IMPRESSION: 1. Interval improvement at the site of the patient's known left breast cancer with no residual mass seen mammographically or sonographically. This suggest good response to current treatment. 2. No mammographic evidence of malignancy on the right. RECOMMENDATION: Per clinical treatment plan. I have discussed the findings and recommendations with the patient. If applicable, a reminder letter will be sent to the patient regarding the next appointment. BI-RADS CATEGORY  6: Known biopsy-proven malignancy. Electronically Signed   By: Kristopher Oppenheim M.D.   On: 10/01/2019 15:00   MM DIAG BREAST TOMO BILATERAL  Result Date: 10/01/2019 CLINICAL DATA:  86 year old female presenting for follow-up of a known left breast cancer. Please note, mammographic evaluation is markedly limited due to the patient's inability to tolerate optimal positioning. The best possible images were obtained. EXAM: DIGITAL DIAGNOSTIC BILATERAL MAMMOGRAM WITH CAD AND TOMO ULTRASOUND LEFT BREAST COMPARISON:  Previous exam(s). ACR Breast Density Category b: There are scattered areas of fibroglandular density. FINDINGS: Post biopsy clip is again identified in the central left breast. Previously demonstrated mass in this region is no longer well seen. No additional suspicious findings are identified in the remainder of either breast. Mammographic images were processed with CAD. Targeted ultrasound is performed, showing interval resolution of the previously identified mass at the 6  o'clock position 3 cm from the nipple. The associated tissue in landmarks are identified in this region, but the mass is no longer seen. IMPRESSION: 1. Interval improvement at the site of the patient's known left breast cancer with no residual mass seen mammographically or sonographically. This suggest good response to current treatment. 2. No mammographic evidence of malignancy on the right. RECOMMENDATION: Per clinical treatment plan. I have discussed the findings and recommendations with the patient. If applicable, a reminder letter will be sent to the patient regarding the next appointment. BI-RADS CATEGORY  6: Known biopsy-proven malignancy. Electronically Signed   By: Kristopher Oppenheim M.D.   On: 10/01/2019 15:00     ELIGIBLE FOR AVAILABLE RESEARCH PROTOCOL: no   ASSESSMENT: 86 y.o. [place], [place] woman status post left central breast biopsy 10/09/2018 for a clinical T1b N0, stage IA days of ductal carcinoma, grade 2, estrogen and progesterone receptor strongly positive, with an MIB-1 of 10%, and no HER-2 amplification.  (1) anastrozole started 10/26/2018  (a) left breast ultrasound 01/29/2019 shows evidence of response  (2) osteopenia/osteoporosis:   (a) bone density 09/12/2011 at [facility] shows a T score of -1.9  (b) started yearly zoledronate April 2021   PLAN:  Maddi is having an excellent response to anastrozole and currently there is no visible mass by ultrasonography or mammography.  This is very favorable.  The plan is to continue the anastrozole a minimum of 5 years.  Of course the concern here is osteoporosis.  She did receive her first zoledronate dose April of this year with no complications.  We will repeat that April of next year.  She will then have repeat mammography and ultrasonography October 2022 and see me shortly thereafter  Discussed fall precautions and prevention with them  Total encounter time 25 minutes.*   Terrel Nesheiwat, Virgie Dad, MD  10/19/19 2:06  PM Medical Oncology and Hematology [facility] [place], [place] [address] Tel. [phone]    Fax. [phone]    I, Wilburn Mylar, am acting as scribe for  Dr. Virgie Dad. Fabienne Nolasco.  I, Lurline Del MD, have reviewed the above documentation for accuracy and completeness, and I agree with the above.   *Total Encounter Time as defined by the Centers for Medicare and Medicaid Services includes, in addition to the face-to-face time of a patient visit (documented in the note above) non-face-to-face time: obtaining and reviewing outside history, ordering and reviewing medications, tests or procedures, care coordination (communications with other health care professionals or caregivers) and documentation in the medical record.

## 2019-10-19 ENCOUNTER — Other Ambulatory Visit: Payer: Self-pay

## 2019-10-19 ENCOUNTER — Inpatient Hospital Stay (HOSPITAL_BASED_OUTPATIENT_CLINIC_OR_DEPARTMENT_OTHER): Payer: PPO | Admitting: Oncology

## 2019-10-19 ENCOUNTER — Inpatient Hospital Stay: Payer: PPO | Attending: Oncology

## 2019-10-19 VITALS — BP 149/64 | HR 55 | Temp 97.2°F | Resp 18 | Wt 153.4 lb

## 2019-10-19 DIAGNOSIS — Z170 Estrogen receptor positive status [ER+]: Secondary | ICD-10-CM | POA: Insufficient documentation

## 2019-10-19 DIAGNOSIS — M85869 Other specified disorders of bone density and structure, unspecified lower leg: Secondary | ICD-10-CM | POA: Diagnosis not present

## 2019-10-19 DIAGNOSIS — Z7982 Long term (current) use of aspirin: Secondary | ICD-10-CM | POA: Insufficient documentation

## 2019-10-19 DIAGNOSIS — C50112 Malignant neoplasm of central portion of left female breast: Secondary | ICD-10-CM

## 2019-10-19 DIAGNOSIS — Z23 Encounter for immunization: Secondary | ICD-10-CM | POA: Insufficient documentation

## 2019-10-19 DIAGNOSIS — Z79899 Other long term (current) drug therapy: Secondary | ICD-10-CM | POA: Insufficient documentation

## 2019-10-19 DIAGNOSIS — M1990 Unspecified osteoarthritis, unspecified site: Secondary | ICD-10-CM | POA: Insufficient documentation

## 2019-10-19 DIAGNOSIS — Z79811 Long term (current) use of aromatase inhibitors: Secondary | ICD-10-CM | POA: Insufficient documentation

## 2019-10-19 LAB — CMP (CANCER CENTER ONLY)
ALT: 12 U/L (ref 0–44)
AST: 17 U/L (ref 15–41)
Albumin: 3.6 g/dL (ref 3.5–5.0)
Alkaline Phosphatase: 68 U/L (ref 38–126)
Anion gap: 3 — ABNORMAL LOW (ref 5–15)
BUN: 35 mg/dL — ABNORMAL HIGH (ref 8–23)
CO2: 28 mmol/L (ref 22–32)
Calcium: 9.6 mg/dL (ref 8.9–10.3)
Chloride: 109 mmol/L (ref 98–111)
Creatinine: 1.18 mg/dL — ABNORMAL HIGH (ref 0.44–1.00)
GFR, Estimated: 40 mL/min — ABNORMAL LOW (ref >60)
Glucose, Bld: 95 mg/dL (ref 70–99)
Potassium: 5.4 mmol/L — ABNORMAL HIGH (ref 3.5–5.1)
Sodium: 140 mmol/L (ref 135–145)
Total Bilirubin: 0.3 mg/dL (ref 0.3–1.2)
Total Protein: 7.7 g/dL (ref 6.5–8.1)

## 2019-10-19 LAB — CBC WITH DIFFERENTIAL (CANCER CENTER ONLY)
Abs Immature Granulocytes: 0.03 10*3/uL (ref 0.00–0.07)
Basophils Absolute: 0 10*3/uL (ref 0.0–0.1)
Basophils Relative: 0 %
Eosinophils Absolute: 0.2 10*3/uL (ref 0.0–0.5)
Eosinophils Relative: 3 %
HCT: 34.4 % — ABNORMAL LOW (ref 36.0–46.0)
Hemoglobin: 11.2 g/dL — ABNORMAL LOW (ref 12.0–15.0)
Immature Granulocytes: 0 %
Lymphocytes Relative: 29 %
Lymphs Abs: 2.3 10*3/uL (ref 0.7–4.0)
MCH: 33.4 pg (ref 26.0–34.0)
MCHC: 32.6 g/dL (ref 30.0–36.0)
MCV: 102.7 fL — ABNORMAL HIGH (ref 80.0–100.0)
Monocytes Absolute: 0.5 10*3/uL (ref 0.1–1.0)
Monocytes Relative: 6 %
Neutro Abs: 4.9 10*3/uL (ref 1.7–7.7)
Neutrophils Relative %: 62 %
Platelet Count: 225 10*3/uL (ref 150–400)
RBC: 3.35 MIL/uL — ABNORMAL LOW (ref 3.87–5.11)
RDW: 11.5 % (ref 11.5–15.5)
WBC Count: 8 10*3/uL (ref 4.0–10.5)
nRBC: 0 % (ref 0.0–0.2)

## 2019-10-22 ENCOUNTER — Telehealth: Payer: Self-pay | Admitting: Oncology

## 2019-10-22 NOTE — Telephone Encounter (Signed)
Per 10/7 sch msg spoke with daughter sch appointment daughter is aware

## 2019-10-29 ENCOUNTER — Inpatient Hospital Stay: Payer: PPO

## 2019-10-29 ENCOUNTER — Other Ambulatory Visit: Payer: Self-pay

## 2019-10-29 DIAGNOSIS — C50112 Malignant neoplasm of central portion of left female breast: Secondary | ICD-10-CM | POA: Diagnosis present

## 2019-10-29 DIAGNOSIS — Z23 Encounter for immunization: Secondary | ICD-10-CM | POA: Diagnosis not present

## 2019-10-29 NOTE — Progress Notes (Signed)
   Covid-19 Vaccination Clinic  Name:  [name]    MRN: [id number] DOB: 1924-04-01  10/29/2019  [name] was observed post Covid-19 immunization for 15 minutes without incident. She was provided with Vaccine Information Sheet and instruction to access the V-Safe system.   [name] was instructed to call 911 with any severe reactions post vaccine: Marland Kitchen Difficulty breathing  . Swelling of face and throat  . A fast heartbeat  . A bad rash all over body  . Dizziness and weakness

## 2019-11-05 ENCOUNTER — Other Ambulatory Visit: Payer: Self-pay

## 2019-11-05 ENCOUNTER — Ambulatory Visit (HOSPITAL_COMMUNITY)
Admission: RE | Admit: 2019-11-05 | Discharge: 2019-11-05 | Disposition: A | Discharge status: Home / Self Care | Payer: PPO | Source: Ambulatory Visit | Attending: Physician Assistant | Admitting: Physician Assistant

## 2019-11-05 ENCOUNTER — Ambulatory Visit (INDEPENDENT_AMBULATORY_CARE_PROVIDER_SITE_OTHER): Payer: PPO | Admitting: Physician Assistant

## 2019-11-05 VITALS — BP 107/62 | HR 78 | Temp 97.4°F | Resp 20 | Ht 65.0 in | Wt 153.0 lb

## 2019-11-05 DIAGNOSIS — I6521 Occlusion and stenosis of right carotid artery: Secondary | ICD-10-CM

## 2019-11-05 NOTE — Progress Notes (Signed)
Established Carotid Patient   History of Present Illness   [name] is a 86 y.o. ([birth year]) female who presents with chief complaint: carotid artery stenosis.  Patient was found to have a right ICA occlusion when she was diagnosed with her CVA in 2005.  She has not had any CVA or TIA since then.  She also denies any current strokelike symptoms including slurring speech, changes in vision, or one-sided weakness.  She continues to live alone however her daughter is present and states that "the days of her living alone are almost over."  She is taking an aspirin and a statin daily.  She denies tobacco use.  Current Outpatient Medications  Medication Sig Dispense Refill  . amLODipine (NORVASC) 5 MG tablet Take 5 mg by mouth daily.     Marland Kitchen anastrozole (ARIMIDEX) 1 MG tablet Take 1 tablet (1 mg total) by mouth daily. 90 tablet 4  . aspirin 81 MG tablet Take 81 mg by mouth daily.    Marland Kitchen atorvastatin (LIPITOR) 20 MG tablet Take 20 mg by mouth daily.     . clonazePAM (KLONOPIN) 0.5 MG tablet Take 0.5 mg by mouth 2 (two) times daily.     . metoprolol tartrate (LOPRESSOR) 25 MG tablet Take 25 mg by mouth daily.     Marland Kitchen PARoxetine (PAXIL) 30 MG tablet Take 30 mg by mouth daily.     Marland Kitchen saccharomyces boulardii (FLORASTOR) 250 MG capsule Take 250 mg by mouth daily.    . traMADol (ULTRAM) 50 MG tablet Take 50 mg by mouth every 6 (six) hours as needed for moderate pain.     No current facility-administered medications for this visit.    REVIEW OF SYSTEMS (negative unless checked):   Cardiac:  []  Chest pain or chest pressure? []  Shortness of breath upon activity? []  Shortness of breath when lying flat? []  Irregular heart rhythm?  Vascular:  []  Pain in calf, thigh, or hip brought on by walking? []  Pain in feet at night that wakes you up from your sleep? []  Blood clot in your veins? []  Leg swelling?  Pulmonary:  []  Oxygen at home? []  Productive cough? []  Wheezing?  Neurologic:  []   Sudden weakness in arms or legs? []  Sudden numbness in arms or legs? []  Sudden onset of difficult speaking or slurred speech? []  Temporary loss of vision in one eye? []  Problems with dizziness?  Gastrointestinal:  []  Blood in stool? []  Vomited blood?  Genitourinary:  []  Burning when urinating? []  Blood in urine?  Psychiatric:  []  Major depression  Hematologic:  []  Bleeding problems? []  Problems with blood clotting?  Dermatologic:  []  Rashes or ulcers?  Constitutional:  []  Fever or chills?  Ear/Nose/Throat:  []  Change in hearing? []  Nose bleeds? []  Sore throat?  Musculoskeletal:  []  Back pain? []  Joint pain? []  Muscle pain?   Physical Examination   Vitals:   11/05/19 1246 11/05/19 1248  BP: (!) 102/58 107/62  Pulse: 78   Resp: 20   Temp: (!) 97.4 F (36.3 C)   TempSrc: Temporal   SpO2: 97%   Weight: 153 lb (69.4 kg)   Height: 5\' 5"  (1.651 m)    Body mass index is 25.46 kg/m.  General:  WDWN in NAD; vital signs documented above Gait: Not observed HENT: WNL, normocephalic Pulmonary: normal non-labored breathing , without Rales, rhonchi,  wheezing Cardiac: regular HR Abdomen: soft, NT, no masses Skin: without rashes Vascular Exam/Pulses:  Right Left  Radial 2+ (normal) 2+ (  normal)   Extremities: without ischemic changes, without Gangrene , without cellulitis; without open wounds;  Musculoskeletal: no muscle wasting or atrophy  Neurologic: A&O X 3;  CN grossly intact Psychiatric:  The pt has Normal affect.  Non-Invasive Vascular Imaging   B Carotid Duplex :   R ICA stenosis:  Occluded  R VA:  patent and antegrade  L ICA stenosis:  1-39%  L VA:  patent and antegrade   Medical Decision Making   [name] is a 85 y.o. female who presents for surveillance of carotid artery stenosis   Known right ICA occlusion with CVA in 2005  Left ICA stenosis estimated to be between 1 and 39%  I discussed with the patient and her daughter  that carotid surveillance going forward may not be necessary given that her right ICA has been chronically occluded and her left ICA stenosis is only 1 to 39%.  It is unlikely that the left ICA stenosis will progress to a level to require revascularization, and the patient's daughter states that she likely would not want surgery if that were to be the case.  They are not yet willing to stop surveillance however would like to extend interval to 2 years.  If at that time left ICA stenosis is unchanged, they would like to stop checking.  I think this is a reasonable plan.  We will recheck carotid duplex in 2 years.   Dagoberto Ligas PA-C [facility] Office: [phone]  Clinic MD: Donzetta Matters

## 2020-01-05 ENCOUNTER — Other Ambulatory Visit: Payer: Self-pay | Admitting: Oncology

## 2020-02-11 DIAGNOSIS — Z0000 Encounter for general adult medical examination without abnormal findings: Secondary | ICD-10-CM | POA: Diagnosis not present

## 2020-02-11 DIAGNOSIS — C50912 Malignant neoplasm of unspecified site of left female breast: Secondary | ICD-10-CM | POA: Diagnosis not present

## 2020-02-11 DIAGNOSIS — Z8673 Personal history of transient ischemic attack (TIA), and cerebral infarction without residual deficits: Secondary | ICD-10-CM | POA: Diagnosis not present

## 2020-02-11 DIAGNOSIS — E782 Mixed hyperlipidemia: Secondary | ICD-10-CM | POA: Diagnosis not present

## 2020-02-11 DIAGNOSIS — Z1389 Encounter for screening for other disorder: Secondary | ICD-10-CM | POA: Diagnosis not present

## 2020-02-11 DIAGNOSIS — F0390 Unspecified dementia without behavioral disturbance: Secondary | ICD-10-CM | POA: Diagnosis not present

## 2020-02-11 DIAGNOSIS — I6522 Occlusion and stenosis of left carotid artery: Secondary | ICD-10-CM | POA: Diagnosis not present

## 2020-02-11 DIAGNOSIS — E46 Unspecified protein-calorie malnutrition: Secondary | ICD-10-CM | POA: Diagnosis not present

## 2020-02-11 DIAGNOSIS — R399 Unspecified symptoms and signs involving the genitourinary system: Secondary | ICD-10-CM | POA: Diagnosis not present

## 2020-02-11 DIAGNOSIS — Z23 Encounter for immunization: Secondary | ICD-10-CM | POA: Diagnosis not present

## 2020-02-11 DIAGNOSIS — I129 Hypertensive chronic kidney disease with stage 1 through stage 4 chronic kidney disease, or unspecified chronic kidney disease: Secondary | ICD-10-CM | POA: Diagnosis not present

## 2020-02-11 DIAGNOSIS — R7303 Prediabetes: Secondary | ICD-10-CM | POA: Diagnosis not present

## 2020-03-01 DIAGNOSIS — F0390 Unspecified dementia without behavioral disturbance: Secondary | ICD-10-CM | POA: Diagnosis not present

## 2020-03-01 DIAGNOSIS — M6281 Muscle weakness (generalized): Secondary | ICD-10-CM | POA: Diagnosis not present

## 2020-04-07 ENCOUNTER — Other Ambulatory Visit: Payer: Self-pay | Admitting: Family Medicine

## 2020-04-07 DIAGNOSIS — R29898 Other symptoms and signs involving the musculoskeletal system: Secondary | ICD-10-CM

## 2020-04-07 DIAGNOSIS — Z8673 Personal history of transient ischemic attack (TIA), and cerebral infarction without residual deficits: Secondary | ICD-10-CM

## 2020-04-10 ENCOUNTER — Ambulatory Visit
Admission: RE | Admit: 2020-04-10 | Discharge: 2020-04-10 | Disposition: A | Discharge status: Home / Self Care | Payer: PPO | Source: Ambulatory Visit | Attending: Family Medicine | Admitting: Family Medicine

## 2020-04-10 ENCOUNTER — Other Ambulatory Visit: Payer: Self-pay

## 2020-04-10 DIAGNOSIS — Z8673 Personal history of transient ischemic attack (TIA), and cerebral infarction without residual deficits: Secondary | ICD-10-CM | POA: Insufficient documentation

## 2020-04-10 DIAGNOSIS — R29898 Other symptoms and signs involving the musculoskeletal system: Secondary | ICD-10-CM | POA: Diagnosis not present

## 2020-04-10 DIAGNOSIS — R531 Weakness: Secondary | ICD-10-CM | POA: Diagnosis not present

## 2020-04-19 ENCOUNTER — Other Ambulatory Visit: Payer: Self-pay | Admitting: *Deleted

## 2020-04-19 DIAGNOSIS — Z170 Estrogen receptor positive status [ER+]: Secondary | ICD-10-CM

## 2020-04-19 DIAGNOSIS — C50112 Malignant neoplasm of central portion of left female breast: Secondary | ICD-10-CM

## 2020-04-21 ENCOUNTER — Other Ambulatory Visit: Payer: Self-pay

## 2020-04-21 ENCOUNTER — Inpatient Hospital Stay: Payer: PPO | Admitting: Adult Health

## 2020-04-21 ENCOUNTER — Inpatient Hospital Stay: Payer: PPO

## 2020-04-21 ENCOUNTER — Inpatient Hospital Stay: Payer: PPO | Attending: Adult Health

## 2020-04-21 VITALS — BP 94/56 | HR 59 | Temp 96.8°F | Resp 19 | Ht 65.0 in | Wt 160.9 lb

## 2020-04-21 VITALS — BP 125/58 | HR 61 | Resp 18

## 2020-04-21 DIAGNOSIS — C50112 Malignant neoplasm of central portion of left female breast: Secondary | ICD-10-CM | POA: Insufficient documentation

## 2020-04-21 DIAGNOSIS — Z170 Estrogen receptor positive status [ER+]: Secondary | ICD-10-CM | POA: Diagnosis not present

## 2020-04-21 DIAGNOSIS — M8580 Other specified disorders of bone density and structure, unspecified site: Secondary | ICD-10-CM | POA: Insufficient documentation

## 2020-04-21 DIAGNOSIS — I959 Hypotension, unspecified: Secondary | ICD-10-CM | POA: Diagnosis not present

## 2020-04-21 LAB — CBC WITH DIFFERENTIAL (CANCER CENTER ONLY)
Abs Immature Granulocytes: 0.02 10*3/uL (ref 0.00–0.07)
Basophils Absolute: 0 10*3/uL (ref 0.0–0.1)
Basophils Relative: 0 %
Eosinophils Absolute: 0.4 10*3/uL (ref 0.0–0.5)
Eosinophils Relative: 5 %
HCT: 31.5 % — ABNORMAL LOW (ref 36.0–46.0)
Hemoglobin: 10.3 g/dL — ABNORMAL LOW (ref 12.0–15.0)
Immature Granulocytes: 0 %
Lymphocytes Relative: 27 %
Lymphs Abs: 1.9 10*3/uL (ref 0.7–4.0)
MCH: 33.7 pg (ref 26.0–34.0)
MCHC: 32.7 g/dL (ref 30.0–36.0)
MCV: 102.9 fL — ABNORMAL HIGH (ref 80.0–100.0)
Monocytes Absolute: 0.5 10*3/uL (ref 0.1–1.0)
Monocytes Relative: 6 %
Neutro Abs: 4.3 10*3/uL (ref 1.7–7.7)
Neutrophils Relative %: 62 %
Platelet Count: 258 10*3/uL (ref 150–400)
RBC: 3.06 MIL/uL — ABNORMAL LOW (ref 3.87–5.11)
RDW: 11.5 % (ref 11.5–15.5)
WBC Count: 7.1 10*3/uL (ref 4.0–10.5)
nRBC: 0 % (ref 0.0–0.2)

## 2020-04-21 LAB — CMP (CANCER CENTER ONLY)
ALT: 10 U/L (ref 0–44)
AST: 14 U/L — ABNORMAL LOW (ref 15–41)
Albumin: 3.6 g/dL (ref 3.5–5.0)
Alkaline Phosphatase: 74 U/L (ref 38–126)
Anion gap: 13 (ref 5–15)
BUN: 48 mg/dL — ABNORMAL HIGH (ref 8–23)
CO2: 22 mmol/L (ref 22–32)
Calcium: 8.9 mg/dL (ref 8.9–10.3)
Chloride: 105 mmol/L (ref 98–111)
Creatinine: 1.55 mg/dL — ABNORMAL HIGH (ref 0.44–1.00)
GFR, Estimated: 31 mL/min — ABNORMAL LOW (ref >60)
Glucose, Bld: 114 mg/dL — ABNORMAL HIGH (ref 70–99)
Potassium: 4.5 mmol/L (ref 3.5–5.1)
Sodium: 140 mmol/L (ref 135–145)
Total Bilirubin: 0.4 mg/dL (ref 0.3–1.2)
Total Protein: 7.4 g/dL (ref 6.5–8.1)

## 2020-04-21 MED ORDER — SODIUM CHLORIDE 0.9 % IV SOLN
Freq: Once | INTRAVENOUS | Status: AC
  Filled 2020-04-21: qty 250

## 2020-04-21 NOTE — Progress Notes (Signed)
Patient assisted back to infusion room via w/c. Patient was a person assist with transfer from w/c to infusion chair. Blood pressure taken (see flowsheets.) Patient denies any dizziness at this time. On call MD notified (Dr. Alvy Bimler.) New orders obtained: 1. No zometa today, 2. 1L of NS over 2 hours, 3. Patient to stop amlodipine 4. Patient to follow up with MD that prescribed amlodipine.    Spoke to patient's daughter face to face about patient's blood pressure and new orders. Daughter verbalized understanding and is going to go ahead and call patient's PCP for a follow up visit.

## 2020-04-21 NOTE — Progress Notes (Addendum)
[place]  Telephone:([phone] Fax:(336) [phone]    ID: [name] DOB: 1924-06-24  MR#: [id number]  OZD#:[id number]  Patient Care Team: Kelton Pillar, MD as PCP - General (Family Medicine) Vickey Huger, MD as Attending Physician (Orthopedic Surgery) Magrinat, Virgie Dad, MD as Consulting Physician (Oncology) OTHER MD:   CHIEF COMPLAINT: estrogen receptor positive breast cancer  CURRENT TREATMENT: anastrozole; zoledronate   INTERVAL HISTORY: [name] returns today for follow up of her estrogen receptor positive breast cancer accompanied by her daughter [name].   She was started on anastrozole at breast clinic.  She tolerates this medication well, and has some mild vaginal dryness, but otherwise no other issues.    She began yearly zoledronate at her last visit on 04/27/2019.  She had no complications from this that she is aware of and tolerates this well.    [name] most recent bilateral diagnostic mammography with tomography and left breast ultrasonography was completed at [facility] on 10/01/2019 showing: breast density category B; mammographic evaluation markedly limited due to patient's inability to tolerate optimal positioning; interval improvement at site of patient's known left breast cancer with no residual mass seen mammographically or sonographically; no evidence of right breast malignancy.    REVIEW OF SYSTEMS: [name] notes that her blood pressure is slightly low today, and on the second try it was 90s/50s.  [name] denies any increased tiredness or dizziness, however is on a couple of different blood pressure medications.  She denies any chest pain, palpitations, cough, shortness of breath, pain, bowel/bladder changes, nausea, or vomiting.  A detailed ROS was otherwise non contributory.     HISTORY OF CURRENT ILLNESS: From the original intake note:  [name] had routine screening mammography on 09/28/2018 showing a possible  abnormality in the left breast. She underwent unilateral left diagnostic mammography with tomography and left breast ultrasonography at [facility] on 10/05/2018 showing: Breast Density Category B. Spot compression views with tomography of the 6 o'clock axis of the left breast show a dense irregular mass with indistinct margins. On physical exam, no mass is palpated in the 6 o'clock axis of the left breast. The skin appears normal. Targeted ultrasound is performed, showing an irregular heterogeneously hypoechoic mass with peripheral echogenic rim at 6 o'clock position 3 cm from the nipple. The mass is taller than wide and measures 8 x 8 x 6 mm. Vascular flow is in the superficial aspect of the mass. Ultrasound of the left axilla is negative for lymphadenopathy.  Accordingly on 10/09/2018 she proceeded to biopsy of the left breast area in question. The pathology from this procedure showed ([id number]): invasive ductal carcinoma, Nottingham grade 2 of 3. Prognostic indicators significant for: estrogen receptor, 100% positive and progesterone receptor, 100% positive, both with strong staining intensity. Proliferation marker Ki67 at 10%. HER2 negative (1+) by immunohistochemistry.  The patient's subsequent history is as detailed below.    PAST MEDICAL HISTORY: Past Medical History:  Diagnosis Date  . Arthritis 04/13/09   Right knee  . Carotid artery occlusion July 2005  . Depression   . Hyperlipidemia   . Hypertension   . Stroke [facility]) July 2005   Mini    PAST SURGICAL HISTORY: Past Surgical History:  Procedure Laterality Date  . CATARACT EXTRACTION W/ INTRAOCULAR LENS  IMPLANT, BILATERAL      FAMILY HISTORY: Family History  Problem Relation Age of Onset  . Heart disease Brother   . Coronary artery disease Brother   . Heart disease  Brother   . Heart disease Brother   . Ovarian cancer Daughter 43       Died at 70  . Breast cancer Neg Hx    Remedy's father died at age 32, sudden  death. Patients' mother died at age 39 from "natural causes".. The patient has 3 brothers and 3 sisters. Patient denies anyone in her family having breast, prostate, or pancreatic cancer. Heidee's oldest daughter was diagnosed with ovarian cancer at the age of 21, and passed at the age of 70.    GYNECOLOGIC HISTORY:  No LMP recorded. Patient is postmenopausal. Menarche: 87 years old Age at first live birth: 87 years old [place] P: 3 LMP: 9 HRT: No  Hysterectomy?:  No BSO?: no    SOCIAL HISTORY: (Current as of 10/26/2018) Serah used to work in the [employer].  She is widowed and lives by herself.  Her daughter [name] as noted above died from ovarian cancer at age 80.  Daughter [name], 60, is Network engineer at [employer] across the street from the hospital.  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  The patient has 3 grandchildren and 2 great-grandchildren.  She attends a [employer]   ADVANCED DIRECTIVES:     HEALTH MAINTENANCE: Social History   Tobacco Use  . Smoking status: Never Smoker  . Smokeless tobacco: Former Systems developer    Types: Snuff  Vaping Use  . Vaping Use: Never used  Substance Use Topics  . Alcohol use: No    Alcohol/week: 0.0 standard drinks  . Drug use: No    Bone density: at Dr. Delene Ruffini 09/12/2011 showed a T score of -1.9   Allergies  Allergen Reactions  . Penicillins   . Sulfa Antibiotics   . Codeine Other (See Comments)  . Penicillin G Rash  . Prednisone Palpitations  . Sulfamethoxazole-Trimethoprim Rash    Current Outpatient Medications  Medication Sig Dispense Refill  . amLODipine (NORVASC) 5 MG tablet Take 5 mg by mouth daily.     Marland Kitchen anastrozole (ARIMIDEX) 1 MG tablet TAKE 1 TABLET(1 MG) BY MOUTH DAILY 90 tablet 4  . aspirin 81 MG tablet Take 81 mg by mouth daily.    Marland Kitchen atorvastatin (LIPITOR) 20 MG tablet Take 20 mg by mouth daily.     . Cholecalciferol (VITAMIN D) 50 MCG (2000 UT) CAPS Take 1 capsule by mouth once a week.    .  clonazePAM (KLONOPIN) 0.5 MG tablet Take 0.5 mg by mouth 2 (two) times daily.     . metoprolol tartrate (LOPRESSOR) 25 MG tablet Take 25 mg by mouth daily.     Marland Kitchen PARoxetine (PAXIL) 30 MG tablet Take 30 mg by mouth daily.     . Probiotic Product (PROBIOTIC-10 PO) Take 1 tablet by mouth daily.    Marland Kitchen saccharomyces boulardii (FLORASTOR) 250 MG capsule Take 250 mg by mouth daily.    Marland Kitchen tiZANidine (ZANAFLEX) 2 MG tablet Take 2-4 mg by mouth every 8 (eight) hours as needed.    . traMADol (ULTRAM) 50 MG tablet Take 50 mg by mouth every 6 (six) hours as needed for moderate pain.     No current facility-administered medications for this visit.    OBJECTIVE:  Vitals:   04/21/20 1313  BP: (!) 94/56  Pulse: (!) 59  Resp: 19  Temp: (!) 96.8 F (36 C)  SpO2: 94%   Wt Readings from Last 3 Encounters:  04/21/20 160 lb 14.4 oz (73 kg)  11/05/19 153 lb (69.4  kg)  10/19/19 153 lb 6.4 oz (69.6 kg)   Body mass index is 26.78 kg/m.    ECOG FS:2 - Symptomatic, <50% confined to bed GENERAL: Patient is a well appearing female in no acute distress HEENT:  Sclerae anicteric. Mask in place. Neck is supple.  NODES:  No cervical, supraclavicular, or axillary lymphadenopathy palpated.  BREAST EXAM:  No breast mass noted LUNGS:  Clear to auscultation bilaterally.  No wheezes or rhonchi. HEART:  Regular rate and rhythm. No murmur appreciated. ABDOMEN:  Soft, nontender.  Positive, normoactive bowel sounds. No organomegaly palpated. MSK:  No focal spinal tenderness to palpation. Full range of motion bilaterally in the upper extremities. EXTREMITIES:  No peripheral edema.   SKIN:  Clear with no obvious rashes or skin changes. No nail dyscrasia. NEURO:  Nonfocal. Well oriented.  Appropriate affect.     LAB RESULTS:  CMP     Component Value Date/Time   NA 140 04/21/2020 1242   K 4.5 04/21/2020 1242   CL 105 04/21/2020 1242   CO2 22 04/21/2020 1242   GLUCOSE 114 (H) 04/21/2020 1242   BUN 48 (H)  04/21/2020 1242   CREATININE 1.55 (H) 04/21/2020 1242   CALCIUM 8.9 04/21/2020 1242   PROT 7.4 04/21/2020 1242   ALBUMIN 3.6 04/21/2020 1242   AST 14 (L) 04/21/2020 1242   ALT 10 04/21/2020 1242   ALKPHOS 74 04/21/2020 1242   BILITOT 0.4 04/21/2020 1242   GFRNONAA 31 (L) 04/21/2020 1242   GFRAA 43 (L) 04/27/2019 1250    No results found for: TOTALPROTELP, ALBUMINELP, A1GS, A2GS, BETS, BETA2SER, GAMS, MSPIKE, SPEI  No results found for: KPAFRELGTCHN, LAMBDASER, KAPLAMBRATIO  Lab Results  Component Value Date   WBC 7.1 04/21/2020   NEUTROABS 4.3 04/21/2020   HGB 10.3 (L) 04/21/2020   HCT 31.5 (L) 04/21/2020   MCV 102.9 (H) 04/21/2020   PLT 258 04/21/2020    No results found for: LABCA2  No components found for: [id number]  No results for input(s): INR in the last 168 hours.  No results found for: LABCA2  No results found for: OHY073  No results found for: XTG626  No results found for: RSW546  No results found for: CA2729  No components found for: HGQUANT  No results found for: CEA1 / No results found for: CEA1   No results found for: AFPTUMOR  No results found for: CHROMOGRNA  No results found for: HGBA, HGBA2QUANT, HGBFQUANT, HGBSQUAN (Hemoglobinopathy evaluation)   No results found for: LDH  No results found for: IRON, TIBC, IRONPCTSAT (Iron and TIBC)  No results found for: FERRITIN  Urinalysis    Component Value Date/Time   COLORURINE YELLOW (A) 07/27/2018 1056   APPEARANCEUR CLEAR (A) 07/27/2018 1056   LABSPEC 1.015 07/27/2018 1056   PHURINE 5.0 07/27/2018 1056   GLUCOSEU NEGATIVE 07/27/2018 1056   HGBUR NEGATIVE 07/27/2018 [place] 07/27/2018 1056   KETONESUR NEGATIVE 07/27/2018 [place] 07/27/2018 1056   NITRITE NEGATIVE 07/27/2018 1056   LEUKOCYTESUR NEGATIVE 07/27/2018 1056    STUDIES:  CT HEAD WO CONTRAST  Result Date: 04/11/2020 CLINICAL DATA:  Weakness in left lower leg. EXAM: CT HEAD WITHOUT  CONTRAST TECHNIQUE: Contiguous axial images were obtained from the base of the skull through the vertex without intravenous contrast. COMPARISON:  CT head 07/28/2018 FINDINGS: Brain: Stable cerebral ventricle sizes that are concordant with the degree of cerebral volume loss. Patchy and confluent areas of decreased attenuation are noted throughout the  deep and periventricular white matter of the cerebral hemispheres bilaterally, compatible with chronic microvascular ischemic disease. No evidence of large-territorial acute infarction. No parenchymal hemorrhage. No mass lesion. No extra-axial collection. No mass effect or midline shift. No hydrocephalus. Basilar cisterns are patent. Vascular: No hyperdense vessel. Skull: No acute fracture or focal lesion. Sinuses/Orbits: Paranasal sinuses and mastoid air cells are clear. Bilateral lens replacement. Otherwise the orbits are unremarkable. Other: None. IMPRESSION: No acute intracranial abnormality. Electronically Signed   By: Iven Finn M.D.   On: 04/11/2020 00:04     ELIGIBLE FOR AVAILABLE RESEARCH PROTOCOL: no   ASSESSMENT: 87 y.o. [place], [place] woman status post left central breast biopsy 10/09/2018 for a clinical T1b N0, stage IA days of ductal carcinoma, grade 2, estrogen and progesterone receptor strongly positive, with an MIB-1 of 10%, and no HER-2 amplification.  (1) anastrozole started 10/26/2018  (a) left breast ultrasound 01/29/2019 shows evidence of response  (2) osteopenia/osteoporosis:   (a) bone density 09/12/2011 at [place] shows a T score of -1.9  (b) started yearly zoledronate April 2021   PLAN:  Mylee continues on Anastrozole daily for her breast cancer.  She is tolerating this well and there is no sign of progression of her breast cancer.  We are monitoring her tumor with a bilateral diagnostic mammogram and ultrasound annually.  This is due again in September with f/u with Dr. Jana Hakim the month later.  [name] and  I talked about her bone health.  She will proceed with the Zoledronate today, which she receives every year to keep her bones strong since she is taking the Anastrozole, so long as her CMET is within parameters which is pending today.    Tahliyah has decreased blood pressure.  She is asymptomatic.  We will re-check it once she gets to the infusion suite.  She is eating and drinking ok, however she is on a couple of different blood pressure pills.  I recommended she stop taking the Amlodipine and check her BP at home twice a day over the weekend.  I also recommended she f/u with her pcp about her blood pressures and if any other dose adjustment is necessary.    Total encounter time 30 minutes spent meeting with patient and her daughter, reviewing lab work, reviewing vital signs, conducting the patient visit and assessment and plan per the documentation, and documenting the encounter.    Wilber Bihari, NP 04/22/20 7:39 PM Medical Oncology and Hematology [facility] [place], [place] [address] Tel. [phone]    Fax. [phone]  Addendum: When patient arrived to infusion her blood pressure was even lower at 80s over 40s.  The nurse did not contact me, instead contacted Dr. Alvy Bimler who was on call.  Her lab work had resulted which showed increased creatinine of 1.55.  The patient was given fluids over two hours, and rescheduled her Zoledronate, which is how we would have handled it if given the information.     *Total Encounter Time as defined by the Centers for Medicare and Medicaid Services includes, in addition to the face-to-face time of a patient visit (documented in the note above) non-face-to-face time: obtaining and reviewing outside history, ordering and reviewing medications, tests or procedures, care coordination (communications with other health care professionals or caregivers) and documentation in the medical record.

## 2020-04-21 NOTE — Patient Instructions (Signed)
Rehydration, Adult Rehydration is the replacement of body fluids, salts, and minerals (electrolytes) that are lost during dehydration. Dehydration is when there is not enough water or other fluids in the body. This happens when you lose more fluids than you take in. Common causes of dehydration include:  Not drinking enough fluids. This can occur when you are ill or doing activities that require a lot of energy, especially in hot weather.  Conditions that cause loss of water or other fluids, such as diarrhea, vomiting, sweating, or urinating a lot.  Other illnesses, such as fever or infection.  Certain medicines, such as those that remove excess fluid from the body (diuretics). Symptoms of mild or moderate dehydration may include thirst, dry lips and mouth, and dizziness. Symptoms of severe dehydration may include increased heart rate, confusion, fainting, and not urinating. For severe dehydration, you may need to get fluids through an IV at the hospital. For mild or moderate dehydration, you can usually rehydrate at home by drinking certain fluids as told by your health care provider. What are the risks? Generally, rehydration is safe. However, taking in too much fluid (overhydration) can be a problem. This is rare. Overhydration can cause an electrolyte imbalance, kidney failure, or a decrease in salt (sodium) levels in the body. Supplies needed You will need an oral rehydration solution (ORS) if your health care provider tells you to use one. This is a drink to treat dehydration. It can be found in pharmacies and retail stores. How to rehydrate Fluids Follow instructions from your health care provider for rehydration. The kind of fluid and the amount you should drink depend on your condition. In general, you should choose drinks that you prefer.  If told by your health care provider, drink an ORS. ? Make an ORS by following instructions on the package. ? Start by drinking small amounts,  about  cup (120 mL) every 5-10 minutes. ? Slowly increase how much you drink until you have taken the amount recommended by your health care provider.  Drink enough clear fluids to keep your urine pale yellow. If you were told to drink an ORS, finish it first, then start slowly drinking other clear fluids. Drink fluids such as: ? Water. This includes sparkling water and flavored water. Drinking only water can lead to having too little sodium in your body (hyponatremia). Follow the advice of your health care provider. ? Water from ice chips you suck on. ? Fruit juice with water you add to it (diluted). ? Sports drinks. ? Hot or cold herbal teas. ? Broth-based soups. ? Milk or milk products. Food Follow instructions from your health care provider about what to eat while you rehydrate. Your health care provider may recommend that you slowly begin eating regular foods in small amounts.  Eat foods that contain a healthy balance of electrolytes, such as bananas, oranges, potatoes, tomatoes, and spinach.  Avoid foods that are greasy or contain a lot of sugar. In some cases, you may get nutrition through a feeding tube that is passed through your nose and into your stomach (nasogastric tube, or NG tube). This may be done if you have uncontrolled vomiting or diarrhea.   Beverages to avoid Certain beverages may make dehydration worse. While you rehydrate, avoid drinking alcohol.   How to tell if you are recovering from dehydration You may be recovering from dehydration if:  You are urinating more often than before you started rehydrating.  Your urine is pale yellow.  Your energy level   improves.  You vomit less frequently.  You have diarrhea less frequently.  Your appetite improves or returns to normal.  You feel less dizzy or less light-headed.  Your skin tone and color start to look more normal. Follow these instructions at home:  Take over-the-counter and prescription medicines only  as told by your health care provider.  Do not take sodium tablets. Doing this can lead to having too much sodium in your body (hypernatremia). Contact a health care provider if:  You continue to have symptoms of mild or moderate dehydration, such as: ? Thirst. ? Dry lips. ? Slightly dry mouth. ? Dizziness. ? Dark urine or less urine than normal. ? Muscle cramps.  You continue to vomit or have diarrhea. Get help right away if you:  Have symptoms of dehydration that get worse.  Have a fever.  Have a severe headache.  Have been vomiting and the following happens: ? Your vomiting gets worse or does not go away. ? Your vomit includes blood or green matter (bile). ? You cannot eat or drink without vomiting.  Have problems with urination or bowel movements, such as: ? Diarrhea that gets worse or does not go away. ? Blood in your stool (feces). This may cause stool to look black and tarry. ? Not urinating, or urinating only a small amount of very dark urine, within 6-8 hours.  Have trouble breathing.  Have symptoms that get worse with treatment. These symptoms may represent a serious problem that is an emergency. Do not wait to see if the symptoms will go away. Get medical help right away. Call your local emergency services (911 in the [place] Do not drive yourself to the hospital. Summary  Rehydration is the replacement of body fluids and minerals (electrolytes) that are lost during dehydration.  Follow instructions from your health care provider for rehydration. The kind of fluid and amount you should drink depend on your condition.  Slowly increase how much you drink until you have taken the amount recommended by your health care provider.  Contact your health care provider if you continue to show signs of mild or moderate dehydration. This information is not intended to replace advice given to you by your health care provider. Make sure you discuss any questions you have with  your health care provider. Document Revised: 03/03/2019 Document Reviewed: 01/11/2019 Elsevier Patient Education  2021 [employer] Inc.  

## 2020-04-21 NOTE — Progress Notes (Signed)
Pt reported to have BP 83/41 per infusion RN. Per Wilber Bihari, NP, no Zometa today, start 1L IVF over 2 hrs, stop Amlodipine and f/u with MD who prescribed Amlodipine.

## 2020-04-22 ENCOUNTER — Encounter: Payer: Self-pay | Admitting: Adult Health

## 2020-04-22 DIAGNOSIS — F329 Major depressive disorder, single episode, unspecified: Secondary | ICD-10-CM | POA: Insufficient documentation

## 2020-04-22 DIAGNOSIS — I779 Disorder of arteries and arterioles, unspecified: Secondary | ICD-10-CM | POA: Insufficient documentation

## 2020-04-22 DIAGNOSIS — E782 Mixed hyperlipidemia: Secondary | ICD-10-CM | POA: Insufficient documentation

## 2020-04-22 DIAGNOSIS — R7303 Prediabetes: Secondary | ICD-10-CM | POA: Insufficient documentation

## 2020-04-22 DIAGNOSIS — E46 Unspecified protein-calorie malnutrition: Secondary | ICD-10-CM | POA: Insufficient documentation

## 2020-04-22 DIAGNOSIS — N183 Chronic kidney disease, stage 3 unspecified: Secondary | ICD-10-CM | POA: Insufficient documentation

## 2020-04-22 DIAGNOSIS — Z8673 Personal history of transient ischemic attack (TIA), and cerebral infarction without residual deficits: Secondary | ICD-10-CM | POA: Insufficient documentation

## 2020-04-22 DIAGNOSIS — F0390 Unspecified dementia without behavioral disturbance: Secondary | ICD-10-CM | POA: Insufficient documentation

## 2020-04-22 DIAGNOSIS — M1990 Unspecified osteoarthritis, unspecified site: Secondary | ICD-10-CM | POA: Insufficient documentation

## 2020-04-22 DIAGNOSIS — R609 Edema, unspecified: Secondary | ICD-10-CM | POA: Insufficient documentation

## 2020-04-25 ENCOUNTER — Other Ambulatory Visit: Payer: Self-pay | Admitting: Oncology

## 2020-04-26 ENCOUNTER — Telehealth: Payer: Self-pay | Admitting: Oncology

## 2020-04-26 DIAGNOSIS — I129 Hypertensive chronic kidney disease with stage 1 through stage 4 chronic kidney disease, or unspecified chronic kidney disease: Secondary | ICD-10-CM | POA: Diagnosis not present

## 2020-04-26 DIAGNOSIS — R627 Adult failure to thrive: Secondary | ICD-10-CM | POA: Diagnosis not present

## 2020-04-26 NOTE — Telephone Encounter (Signed)
Scheduled appts per 4/9 sch msg. Pt's caregiver is aware.

## 2020-04-28 ENCOUNTER — Other Ambulatory Visit: Payer: Self-pay

## 2020-04-28 DIAGNOSIS — Z170 Estrogen receptor positive status [ER+]: Secondary | ICD-10-CM

## 2020-04-28 DIAGNOSIS — C50112 Malignant neoplasm of central portion of left female breast: Secondary | ICD-10-CM

## 2020-04-30 NOTE — Progress Notes (Signed)
[place]  [phone]) [phone] Fax:(336) [phone]    ID: [name] DOB: February 15, 1981  MR#: [id number]  FYB#:[id number]  Patient Care Team: Kelton Pillar, MD as PCP - General (Family Medicine) Vickey Huger, MD as Attending Physician (Orthopedic Surgery) Theodore Virgin, Virgie Dad, MD as Consulting Physician (Oncology) OTHER MD:   CHIEF COMPLAINT: estrogen receptor positive breast cancer  CURRENT TREATMENT: anastrozole; zoledronate   INTERVAL HISTORY: Denika returns today for follow up of her estrogen receptor positive breast cancer accompanied by her daughter [name].   She was started on anastrozole at breast clinic.  Recall she has never had surgery for her breast cancer.  She tolerates this medication well, with no side effects that she is aware of.  She began yearly zoledronate on 04/27/2019.  She had no complications from this that she is aware of and tolerates this well.  She is receiving it as slightly decreased doses.  Significantly she has had no problems with bone pain and no dental issues   REVIEW OF SYSTEMS: Celita still lives by herself.  Her daughter is trying to get her into a skilled nursing facility.  It is of concern that the patient does not use a walker at home.  She seems to use a cane but of course she is there by herself.  A detailed review of systems today was otherwise stable.   COVID 19 VACCINATION STATUS: Status post Pfizer x3 with booster October 2021   HISTORY OF CURRENT ILLNESS: From the original intake note:  [name] had routine screening mammography on 09/28/2018 showing a possible abnormality in the left breast. She underwent unilateral left diagnostic mammography with tomography and left breast ultrasonography at [facility] on 10/05/2018 showing: Breast Density Category B. Spot compression views with tomography of the 6 o'clock axis of the left breast show a dense irregular mass with indistinct margins. On physical  exam, no mass is palpated in the 6 o'clock axis of the left breast. The skin appears normal. Targeted ultrasound is performed, showing an irregular heterogeneously hypoechoic mass with peripheral echogenic rim at 6 o'clock position 3 cm from the nipple. The mass is taller than wide and measures 8 x 8 x 6 mm. Vascular flow is in the superficial aspect of the mass. Ultrasound of the left axilla is negative for lymphadenopathy.  Accordingly on 10/09/2018 she proceeded to biopsy of the left breast area in question. The pathology from this procedure showed ([id number]): invasive ductal carcinoma, Nottingham grade 2 of 3. Prognostic indicators significant for: estrogen receptor, 100% positive and progesterone receptor, 100% positive, both with strong staining intensity. Proliferation marker Ki67 at 10%. HER2 negative (1+) by immunohistochemistry.  The patient's subsequent history is as detailed below.    PAST MEDICAL HISTORY: Past Medical History:  Diagnosis Date  . Arthritis 04/13/09   Right knee  . Carotid artery occlusion July 2005  . Depression   . Hyperlipidemia   . Hypertension   . Stroke [facility]) July 2005   Mini    PAST SURGICAL HISTORY: Past Surgical History:  Procedure Laterality Date  . CATARACT EXTRACTION W/ INTRAOCULAR LENS  IMPLANT, BILATERAL      FAMILY HISTORY: Family History  Problem Relation Age of Onset  . Heart disease Brother   . Coronary artery disease Brother   . Heart disease Brother   . Heart disease Brother   . Ovarian cancer Daughter 60       Died at 62  . Breast cancer Neg Hx  Pavneet's father died at age 64, sudden death. Patients' mother died at age 85 from "natural causes".. The patient has 3 brothers and 3 sisters. Patient denies anyone in her family having breast, prostate, or pancreatic cancer. Saya's oldest daughter was diagnosed with ovarian cancer at the age of 41, and passed at the age of 7.    GYNECOLOGIC HISTORY:  No LMP recorded. Patient  is postmenopausal. Menarche: 87 years old Age at first live birth: 87 years old Irwin P: 3 LMP: 9 HRT: No  Hysterectomy?:  No BSO?: no    SOCIAL HISTORY: (Current as of 10/26/2018) Martena used to work in the [employer].  She is widowed and lives by herself.  Her daughter Butch Penny as noted above died from ovarian cancer at age 36.  Daughter [name], 12, is Network engineer at [employer] across the street from the hospital.  Annie Sable 67 is a retired Agricultural consultant.  The patient has 3 grandchildren and 2 great-grandchildren.  She attends a [employer]   ADVANCED DIRECTIVES:     HEALTH MAINTENANCE: Social History   Tobacco Use  . Smoking status: Never Smoker  . Smokeless tobacco: Former Systems developer    Types: Snuff  Vaping Use  . Vaping Use: Never used  Substance Use Topics  . Alcohol use: No    Alcohol/week: 0.0 standard drinks  . Drug use: No    Bone density: at Dr. Delene Ruffini 09/12/2011 showed a T score of -1.9   Allergies  Allergen Reactions  . Penicillins   . Sulfa Antibiotics   . Codeine Other (See Comments)  . Penicillin G Rash  . Prednisone Palpitations  . Sulfamethoxazole-Trimethoprim Rash    Current Outpatient Medications  Medication Sig Dispense Refill  . amLODipine (NORVASC) 5 MG tablet Take 5 mg by mouth daily.     Marland Kitchen anastrozole (ARIMIDEX) 1 MG tablet TAKE 1 TABLET(1 MG) BY MOUTH DAILY 90 tablet 4  . aspirin 81 MG tablet Take 81 mg by mouth daily.    Marland Kitchen atorvastatin (LIPITOR) 20 MG tablet Take 20 mg by mouth daily.     . Cholecalciferol (VITAMIN D) 50 MCG (2000 UT) CAPS Take 1 capsule by mouth once a week.    . clonazePAM (KLONOPIN) 0.5 MG tablet Take 0.5 mg by mouth 2 (two) times daily.     . metoprolol tartrate (LOPRESSOR) 25 MG tablet Take 25 mg by mouth daily.     Marland Kitchen PARoxetine (PAXIL) 30 MG tablet Take 30 mg by mouth daily.     . Probiotic Product (PROBIOTIC-10 PO) Take 1 tablet by mouth daily.    Marland Kitchen saccharomyces boulardii (FLORASTOR) 250 MG capsule  Take 250 mg by mouth daily.    Marland Kitchen tiZANidine (ZANAFLEX) 2 MG tablet Take 2-4 mg by mouth every 8 (eight) hours as needed.    . traMADol (ULTRAM) 50 MG tablet Take 50 mg by mouth every 6 (six) hours as needed for moderate pain.     No current facility-administered medications for this visit.    OBJECTIVE: Elderly white woman examined in a wheelchair  Vitals:   05/01/20 1100  BP: (!) 131/58  Pulse: (!) 56  Resp: 17  Temp: (!) 97.5 F (36.4 C)  SpO2: 98%   Wt Readings from Last 3 Encounters:  05/01/20 157 lb 6.4 oz (71.4 kg)  04/21/20 160 lb 14.4 oz (73 kg)  11/05/19 153 lb (69.4 kg)   Body mass index is 26.19 kg/m.    ECOG FS:2 - Symptomatic, <50%  confined to bed  Sclerae unicteric, EOMs intact Wearing a mask No cervical or supraclavicular adenopathy Lungs no rales or rhonchi Heart regular rate and rhythm Abd soft, nontender, positive bowel sounds MSK no focal spinal tenderness, no upper extremity lymphedema Neuro: nonfocal, well oriented, appropriate affect Breasts: Deferred   LAB RESULTS:  CMP     Component Value Date/Time   NA 138 05/01/2020 1029   K 4.7 05/01/2020 1029   CL 101 05/01/2020 1029   CO2 25 05/01/2020 1029   GLUCOSE 98 05/01/2020 1029   BUN 42 (H) 05/01/2020 1029   CREATININE 1.89 (H) 05/01/2020 1029   CALCIUM 9.2 05/01/2020 1029   PROT 7.5 05/01/2020 1029   ALBUMIN 3.7 05/01/2020 1029   AST 17 05/01/2020 1029   ALT 9 05/01/2020 1029   ALKPHOS 71 05/01/2020 1029   BILITOT 0.4 05/01/2020 1029   GFRNONAA 24 (L) 05/01/2020 1029   GFRAA 43 (L) 04/27/2019 1250    No results found for: TOTALPROTELP, ALBUMINELP, A1GS, A2GS, BETS, BETA2SER, GAMS, MSPIKE, SPEI  No results found for: KPAFRELGTCHN, LAMBDASER, KAPLAMBRATIO  Lab Results  Component Value Date   WBC 8.7 05/01/2020   NEUTROABS 5.0 05/01/2020   HGB 11.5 (L) 05/01/2020   HCT 35.4 (L) 05/01/2020   MCV 103.8 (H) 05/01/2020   PLT 216 05/01/2020    No results found for: LABCA2  No  components found for: [id number]  No results for input(s): INR in the last 168 hours.  No results found for: LABCA2  No results found for: ELF810  No results found for: FBP102  No results found for: HEN277  No results found for: CA2729  No components found for: HGQUANT  No results found for: CEA1 / No results found for: CEA1   No results found for: AFPTUMOR  No results found for: CHROMOGRNA  No results found for: HGBA, HGBA2QUANT, HGBFQUANT, HGBSQUAN (Hemoglobinopathy evaluation)   No results found for: LDH  No results found for: IRON, TIBC, IRONPCTSAT (Iron and TIBC)  No results found for: FERRITIN  Urinalysis    Component Value Date/Time   COLORURINE YELLOW (A) 07/27/2018 1056   APPEARANCEUR CLEAR (A) 07/27/2018 1056   LABSPEC 1.015 07/27/2018 1056   PHURINE 5.0 07/27/2018 1056   GLUCOSEU NEGATIVE 07/27/2018 1056   HGBUR NEGATIVE 07/27/2018 [place] 07/27/2018 1056   KETONESUR NEGATIVE 07/27/2018 [place] 07/27/2018 1056   NITRITE NEGATIVE 07/27/2018 1056   LEUKOCYTESUR NEGATIVE 07/27/2018 1056    STUDIES:  CT HEAD WO CONTRAST  Result Date: 04/11/2020 CLINICAL DATA:  Weakness in left lower leg. EXAM: CT HEAD WITHOUT CONTRAST TECHNIQUE: Contiguous axial images were obtained from the base of the skull through the vertex without intravenous contrast. COMPARISON:  CT head 07/28/2018 FINDINGS: Brain: Stable cerebral ventricle sizes that are concordant with the degree of cerebral volume loss. Patchy and confluent areas of decreased attenuation are noted throughout the deep and periventricular white matter of the cerebral hemispheres bilaterally, compatible with chronic microvascular ischemic disease. No evidence of large-territorial acute infarction. No parenchymal hemorrhage. No mass lesion. No extra-axial collection. No mass effect or midline shift. No hydrocephalus. Basilar cisterns are patent. Vascular: No hyperdense vessel. Skull:  No acute fracture or focal lesion. Sinuses/Orbits: Paranasal sinuses and mastoid air cells are clear. Bilateral lens replacement. Otherwise the orbits are unremarkable. Other: None. IMPRESSION: No acute intracranial abnormality. Electronically Signed   By: Iven Finn M.D.   On: 04/11/2020 00:04     ELIGIBLE FOR AVAILABLE  RESEARCH PROTOCOL: no   ASSESSMENT: 87 y.o. [place], [place] woman status post left central breast biopsy 10/09/2018 for a clinical T1b N0, stage IA days of ductal carcinoma, grade 2, estrogen and progesterone receptor strongly positive, with an MIB-1 of 10%, and no HER-2 amplification.  (1) anastrozole started 10/26/2018  (a) left breast ultrasound 01/29/2019 shows evidence of response  (2) osteopenia/osteoporosis:   (a) bone density 09/12/2011 at [facility] shows a T score of -1.9  (b) started yearly zoledronate April 2021   PLAN:  Annora is now 1-1/2 years out from initial diagnosis of breast cancer.  She never had a lumpectomy and we are continuing to follow the tumor with yearly ultrasonography as well as regular mammography.  She is a year and a half into anastrozole and the plan is to continue that a total of 5years.  She does have significant osteopenia and is receiving zoledronate yearly.  She will have a dose today.  She will return to see [place] April 2023 for another visit, set of labs, and zolendronate  Today we reviewed fall precautions and the serial understands that I am strongly encouraging her to use her walker at all times at home.  They know to call for any other issue that may develop before the next visit  Total encounter time 25 minutes.Sarajane Jews C. Loraine Bhullar, MD 05/01/20 6:32 PM Medical Oncology and Hematology [facility] [place], [place] [address] Tel. [phone]    Fax. [phone]   I, Wilburn Mylar, am acting as scribe for Dr. Virgie Dad. Anjalee Cope.  I, Lurline Del MD, have reviewed  the above documentation for accuracy and completeness, and I agree with the above.    *Total Encounter Time as defined by the Centers for Medicare and Medicaid Services includes, in addition to the face-to-face time of a patient visit (documented in the note above) non-face-to-face time: obtaining and reviewing outside history, ordering and reviewing medications, tests or procedures, care coordination (communications with other health care professionals or caregivers) and documentation in the medical record.

## 2020-05-01 ENCOUNTER — Other Ambulatory Visit: Payer: Self-pay

## 2020-05-01 ENCOUNTER — Inpatient Hospital Stay: Payer: PPO

## 2020-05-01 ENCOUNTER — Inpatient Hospital Stay: Payer: PPO | Admitting: Oncology

## 2020-05-01 VITALS — BP 131/58 | HR 56 | Temp 97.5°F | Resp 17 | Ht 65.0 in | Wt 157.4 lb

## 2020-05-01 DIAGNOSIS — C50112 Malignant neoplasm of central portion of left female breast: Secondary | ICD-10-CM | POA: Diagnosis not present

## 2020-05-01 DIAGNOSIS — Z170 Estrogen receptor positive status [ER+]: Secondary | ICD-10-CM

## 2020-05-01 DIAGNOSIS — M85862 Other specified disorders of bone density and structure, left lower leg: Secondary | ICD-10-CM

## 2020-05-01 LAB — CBC WITH DIFFERENTIAL (CANCER CENTER ONLY)
Abs Immature Granulocytes: 0.04 10*3/uL (ref 0.00–0.07)
Basophils Absolute: 0 10*3/uL (ref 0.0–0.1)
Basophils Relative: 0 %
Eosinophils Absolute: 0.4 10*3/uL (ref 0.0–0.5)
Eosinophils Relative: 4 %
HCT: 35.4 % — ABNORMAL LOW (ref 36.0–46.0)
Hemoglobin: 11.5 g/dL — ABNORMAL LOW (ref 12.0–15.0)
Immature Granulocytes: 1 %
Lymphocytes Relative: 32 %
Lymphs Abs: 2.8 10*3/uL (ref 0.7–4.0)
MCH: 33.7 pg (ref 26.0–34.0)
MCHC: 32.5 g/dL (ref 30.0–36.0)
MCV: 103.8 fL — ABNORMAL HIGH (ref 80.0–100.0)
Monocytes Absolute: 0.5 10*3/uL (ref 0.1–1.0)
Monocytes Relative: 6 %
Neutro Abs: 5 10*3/uL (ref 1.7–7.7)
Neutrophils Relative %: 57 %
Platelet Count: 216 10*3/uL (ref 150–400)
RBC: 3.41 MIL/uL — ABNORMAL LOW (ref 3.87–5.11)
RDW: 11.6 % (ref 11.5–15.5)
WBC Count: 8.7 10*3/uL (ref 4.0–10.5)
nRBC: 0 % (ref 0.0–0.2)

## 2020-05-01 LAB — CMP (CANCER CENTER ONLY)
ALT: 9 U/L (ref 0–44)
AST: 17 U/L (ref 15–41)
Albumin: 3.7 g/dL (ref 3.5–5.0)
Alkaline Phosphatase: 71 U/L (ref 38–126)
Anion gap: 12 (ref 5–15)
BUN: 42 mg/dL — ABNORMAL HIGH (ref 8–23)
CO2: 25 mmol/L (ref 22–32)
Calcium: 9.2 mg/dL (ref 8.9–10.3)
Chloride: 101 mmol/L (ref 98–111)
Creatinine: 1.89 mg/dL — ABNORMAL HIGH (ref 0.44–1.00)
GFR, Estimated: 24 mL/min — ABNORMAL LOW (ref >60)
Glucose, Bld: 98 mg/dL (ref 70–99)
Potassium: 4.7 mmol/L (ref 3.5–5.1)
Sodium: 138 mmol/L (ref 135–145)
Total Bilirubin: 0.4 mg/dL (ref 0.3–1.2)
Total Protein: 7.5 g/dL (ref 6.5–8.1)

## 2020-05-01 MED ORDER — ZOLEDRONIC ACID 4 MG/5ML IV CONC
3.0000 mg | Freq: Once | INTRAVENOUS | Status: AC
  Administered 2020-05-01: 3 mg via INTRAVENOUS
  Filled 2020-05-01: qty 3.75

## 2020-05-01 MED ORDER — ZOLEDRONIC ACID 4 MG/100ML IV SOLN
INTRAVENOUS | Status: AC
  Filled 2020-05-01: qty 100

## 2020-05-01 MED ORDER — SODIUM CHLORIDE 0.9 % IV SOLN
Freq: Once | INTRAVENOUS | Status: AC
  Filled 2020-05-01: qty 250

## 2020-05-01 NOTE — Progress Notes (Signed)
Ok to proceed with 3 mg of Zometa per MD.  Larene Beach, PharmD

## 2020-05-01 NOTE — Patient Instructions (Signed)
Zoledronic Acid Injection (Hypercalcemia, Oncology) What is this medicine? ZOLEDRONIC ACID (ZOE le dron ik AS id) slows calcium loss from bones. It high calcium levels in the blood from some kinds of cancer. It may be used in other people at risk for bone loss. This medicine may be used for other purposes; ask your health care provider or pharmacist if you have questions. COMMON BRAND NAME(S): Zometa What should I tell my health care provider before I take this medicine? They need to know if you have any of these conditions:  cancer  dehydration  dental disease  kidney disease  liver disease  low levels of calcium in the blood  lung or breathing disease (asthma)  receiving steroids like dexamethasone or prednisone  an unusual or allergic reaction to zoledronic acid, other medicines, foods, dyes, or preservatives  pregnant or trying to get pregnant  breast-feeding How should I use this medicine? This drug is injected into a vein. It is given by a health care provider in a hospital or clinic setting. Talk to your health care provider about the use of this drug in children. Special care may be needed. Overdosage: If you think you have taken too much of this medicine contact a poison control center or emergency room at once. NOTE: This medicine is only for you. Do not share this medicine with others. What if I miss a dose? Keep appointments for follow-up doses. It is important not to miss your dose. Call your health care provider if you are unable to keep an appointment. What may interact with this medicine?  certain antibiotics given by injection  NSAIDs, medicines for pain and inflammation, like ibuprofen or naproxen  some diuretics like bumetanide, furosemide  teriparatide  thalidomide This list may not describe all possible interactions. Give your health care provider a list of all the medicines, herbs, non-prescription drugs, or dietary supplements you use. Also tell  them if you smoke, drink alcohol, or use illegal drugs. Some items may interact with your medicine. What should I watch for while using this medicine? Visit your health care provider for regular checks on your progress. It may be some time before you see the benefit from this drug. Some people who take this drug have severe bone, joint, or muscle pain. This drug may also increase your risk for jaw problems or a broken thigh bone. Tell your health care provider right away if you have severe pain in your jaw, bones, joints, or muscles. Tell you health care provider if you have any pain that does not go away or that gets worse. Tell your dentist and dental surgeon that you are taking this drug. You should not have major dental surgery while on this drug. See your dentist to have a dental exam and fix any dental problems before starting this drug. Take good care of your teeth while on this drug. Make sure you see your dentist for regular follow-up appointments. You should make sure you get enough calcium and vitamin D while you are taking this drug. Discuss the foods you eat and the vitamins you take with your health care provider. Check with your health care provider if you have severe diarrhea, nausea, and vomiting, or if you sweat a lot. The loss of too much body fluid may make it dangerous for you to take this drug. You may need blood work done while you are taking this drug. Do not become pregnant while taking this drug. Women should inform their health care provider   if they wish to become pregnant or think they might be pregnant. There is potential for serious harm to an unborn child. Talk to your health care provider for more information. What side effects may I notice from receiving this medicine? Side effects that you should report to your doctor or health care provider as soon as possible:  allergic reactions (skin rash, itching or hives; swelling of the face, lips, or tongue)  bone  pain  infection (fever, chills, cough, sore throat, pain or trouble passing urine)  jaw pain, especially after dental work  joint pain  kidney injury (trouble passing urine or change in the amount of urine)  low blood pressure (dizziness; feeling faint or lightheaded, falls; unusually weak or tired)  low calcium levels (fast heartbeat; muscle cramps or pain; pain, tingling, or numbness in the hands or feet; seizures)  low magnesium levels (fast, irregular heartbeat; muscle cramp or pain; muscle weakness; tremors; seizures)  low red blood cell counts (trouble breathing; feeling faint; lightheaded, falls; unusually weak or tired)  muscle pain  redness, blistering, peeling, or loosening of the skin, including inside the mouth  severe diarrhea  swelling of the ankles, feet, hands  trouble breathing Side effects that usually do not require medical attention (report to your doctor or health care provider if they continue or are bothersome):  anxious  constipation  coughing  depressed mood  eye irritation, itching, or pain  fever  general ill feeling or flu-like symptoms  nausea  pain, redness, or irritation at site where injected  trouble sleeping This list may not describe all possible side effects. Call your doctor for medical advice about side effects. You may report side effects to FDA at 1-800-FDA-1088. Where should I keep my medicine? This drug is given in a hospital or clinic. It will not be stored at home. NOTE: This sheet is a summary. It may not cover all possible information. If you have questions about this medicine, talk to your doctor, pharmacist, or health care provider.  2021 Elsevier/Gold Standard (2018-10-15 09:13:00)  

## 2020-06-14 DIAGNOSIS — R269 Unspecified abnormalities of gait and mobility: Secondary | ICD-10-CM | POA: Diagnosis not present

## 2020-06-14 DIAGNOSIS — H9190 Unspecified hearing loss, unspecified ear: Secondary | ICD-10-CM | POA: Diagnosis not present

## 2020-06-14 DIAGNOSIS — M7989 Other specified soft tissue disorders: Secondary | ICD-10-CM | POA: Diagnosis not present

## 2020-07-01 DIAGNOSIS — R600 Localized edema: Secondary | ICD-10-CM | POA: Diagnosis not present

## 2020-07-06 DIAGNOSIS — R600 Localized edema: Secondary | ICD-10-CM | POA: Diagnosis not present

## 2020-07-06 DIAGNOSIS — H9190 Unspecified hearing loss, unspecified ear: Secondary | ICD-10-CM | POA: Diagnosis not present

## 2020-07-06 DIAGNOSIS — I129 Hypertensive chronic kidney disease with stage 1 through stage 4 chronic kidney disease, or unspecified chronic kidney disease: Secondary | ICD-10-CM | POA: Diagnosis not present

## 2020-07-06 DIAGNOSIS — I6522 Occlusion and stenosis of left carotid artery: Secondary | ICD-10-CM | POA: Diagnosis not present

## 2020-07-06 DIAGNOSIS — L900 Lichen sclerosus et atrophicus: Secondary | ICD-10-CM | POA: Diagnosis not present

## 2020-07-06 DIAGNOSIS — M7989 Other specified soft tissue disorders: Secondary | ICD-10-CM | POA: Diagnosis not present

## 2020-07-06 DIAGNOSIS — M1990 Unspecified osteoarthritis, unspecified site: Secondary | ICD-10-CM | POA: Diagnosis not present

## 2020-07-06 DIAGNOSIS — F0390 Unspecified dementia without behavioral disturbance: Secondary | ICD-10-CM | POA: Diagnosis not present

## 2020-07-06 DIAGNOSIS — F419 Anxiety disorder, unspecified: Secondary | ICD-10-CM | POA: Diagnosis not present

## 2020-07-06 DIAGNOSIS — G8222 Paraplegia, incomplete: Secondary | ICD-10-CM | POA: Diagnosis not present

## 2020-07-06 DIAGNOSIS — M8580 Other specified disorders of bone density and structure, unspecified site: Secondary | ICD-10-CM | POA: Diagnosis not present

## 2020-07-06 DIAGNOSIS — Z9181 History of falling: Secondary | ICD-10-CM | POA: Diagnosis not present

## 2020-07-06 DIAGNOSIS — F32A Depression, unspecified: Secondary | ICD-10-CM | POA: Diagnosis not present

## 2020-07-06 DIAGNOSIS — Z993 Dependence on wheelchair: Secondary | ICD-10-CM | POA: Diagnosis not present

## 2020-07-06 DIAGNOSIS — E46 Unspecified protein-calorie malnutrition: Secondary | ICD-10-CM | POA: Diagnosis not present

## 2020-07-06 DIAGNOSIS — E782 Mixed hyperlipidemia: Secondary | ICD-10-CM | POA: Diagnosis not present

## 2020-07-06 DIAGNOSIS — N183 Chronic kidney disease, stage 3 unspecified: Secondary | ICD-10-CM | POA: Diagnosis not present

## 2020-07-09 ENCOUNTER — Emergency Department: Payer: PPO

## 2020-07-09 ENCOUNTER — Other Ambulatory Visit: Payer: Self-pay

## 2020-07-09 ENCOUNTER — Inpatient Hospital Stay
Admission: EM | Admit: 2020-07-09 | Discharge: 2020-07-20 | DRG: 689 | Disposition: A | Discharge status: Skilled Nursing Facility | Payer: PPO | Attending: Internal Medicine | Admitting: Internal Medicine

## 2020-07-09 DIAGNOSIS — R5381 Other malaise: Secondary | ICD-10-CM | POA: Diagnosis not present

## 2020-07-09 DIAGNOSIS — M6281 Muscle weakness (generalized): Secondary | ICD-10-CM | POA: Diagnosis not present

## 2020-07-09 DIAGNOSIS — Z79811 Long term (current) use of aromatase inhibitors: Secondary | ICD-10-CM

## 2020-07-09 DIAGNOSIS — I517 Cardiomegaly: Secondary | ICD-10-CM | POA: Diagnosis not present

## 2020-07-09 DIAGNOSIS — I2699 Other pulmonary embolism without acute cor pulmonale: Secondary | ICD-10-CM | POA: Diagnosis not present

## 2020-07-09 DIAGNOSIS — Z8249 Family history of ischemic heart disease and other diseases of the circulatory system: Secondary | ICD-10-CM

## 2020-07-09 DIAGNOSIS — E785 Hyperlipidemia, unspecified: Secondary | ICD-10-CM | POA: Diagnosis not present

## 2020-07-09 DIAGNOSIS — Z8673 Personal history of transient ischemic attack (TIA), and cerebral infarction without residual deficits: Secondary | ICD-10-CM | POA: Diagnosis not present

## 2020-07-09 DIAGNOSIS — I248 Other forms of acute ischemic heart disease: Secondary | ICD-10-CM | POA: Diagnosis present

## 2020-07-09 DIAGNOSIS — D649 Anemia, unspecified: Secondary | ICD-10-CM | POA: Diagnosis not present

## 2020-07-09 DIAGNOSIS — N179 Acute kidney failure, unspecified: Secondary | ICD-10-CM | POA: Diagnosis not present

## 2020-07-09 DIAGNOSIS — Z853 Personal history of malignant neoplasm of breast: Secondary | ICD-10-CM | POA: Diagnosis not present

## 2020-07-09 DIAGNOSIS — Z66 Do not resuscitate: Secondary | ICD-10-CM | POA: Diagnosis present

## 2020-07-09 DIAGNOSIS — R0902 Hypoxemia: Secondary | ICD-10-CM | POA: Diagnosis not present

## 2020-07-09 DIAGNOSIS — U071 COVID-19: Secondary | ICD-10-CM

## 2020-07-09 DIAGNOSIS — R600 Localized edema: Secondary | ICD-10-CM

## 2020-07-09 DIAGNOSIS — Z043 Encounter for examination and observation following other accident: Secondary | ICD-10-CM | POA: Diagnosis not present

## 2020-07-09 DIAGNOSIS — R748 Abnormal levels of other serum enzymes: Secondary | ICD-10-CM | POA: Diagnosis not present

## 2020-07-09 DIAGNOSIS — B9620 Unspecified Escherichia coli [E. coli] as the cause of diseases classified elsewhere: Secondary | ICD-10-CM | POA: Diagnosis not present

## 2020-07-09 DIAGNOSIS — W19XXXA Unspecified fall, initial encounter: Secondary | ICD-10-CM | POA: Diagnosis not present

## 2020-07-09 DIAGNOSIS — N3000 Acute cystitis without hematuria: Secondary | ICD-10-CM | POA: Diagnosis not present

## 2020-07-09 DIAGNOSIS — R262 Difficulty in walking, not elsewhere classified: Secondary | ICD-10-CM | POA: Diagnosis not present

## 2020-07-09 DIAGNOSIS — M25522 Pain in left elbow: Secondary | ICD-10-CM | POA: Diagnosis not present

## 2020-07-09 DIAGNOSIS — H93299 Other abnormal auditory perceptions, unspecified ear: Secondary | ICD-10-CM | POA: Diagnosis not present

## 2020-07-09 DIAGNOSIS — R778 Other specified abnormalities of plasma proteins: Secondary | ICD-10-CM | POA: Diagnosis not present

## 2020-07-09 DIAGNOSIS — I10 Essential (primary) hypertension: Secondary | ICD-10-CM

## 2020-07-09 DIAGNOSIS — G9389 Other specified disorders of brain: Secondary | ICD-10-CM | POA: Diagnosis not present

## 2020-07-09 DIAGNOSIS — R52 Pain, unspecified: Secondary | ICD-10-CM | POA: Diagnosis not present

## 2020-07-09 DIAGNOSIS — J9811 Atelectasis: Secondary | ICD-10-CM | POA: Diagnosis not present

## 2020-07-09 DIAGNOSIS — R531 Weakness: Secondary | ICD-10-CM | POA: Diagnosis not present

## 2020-07-09 DIAGNOSIS — R41841 Cognitive communication deficit: Secondary | ICD-10-CM | POA: Diagnosis not present

## 2020-07-09 DIAGNOSIS — R296 Repeated falls: Secondary | ICD-10-CM

## 2020-07-09 DIAGNOSIS — Y92009 Unspecified place in unspecified non-institutional (private) residence as the place of occurrence of the external cause: Secondary | ICD-10-CM | POA: Diagnosis not present

## 2020-07-09 DIAGNOSIS — I951 Orthostatic hypotension: Secondary | ICD-10-CM | POA: Diagnosis present

## 2020-07-09 DIAGNOSIS — N390 Urinary tract infection, site not specified: Secondary | ICD-10-CM | POA: Diagnosis not present

## 2020-07-09 DIAGNOSIS — Z8041 Family history of malignant neoplasm of ovary: Secondary | ICD-10-CM | POA: Diagnosis not present

## 2020-07-09 DIAGNOSIS — Z79899 Other long term (current) drug therapy: Secondary | ICD-10-CM

## 2020-07-09 DIAGNOSIS — R279 Unspecified lack of coordination: Secondary | ICD-10-CM | POA: Diagnosis not present

## 2020-07-09 DIAGNOSIS — Z7982 Long term (current) use of aspirin: Secondary | ICD-10-CM | POA: Diagnosis not present

## 2020-07-09 DIAGNOSIS — R2681 Unsteadiness on feet: Secondary | ICD-10-CM | POA: Diagnosis not present

## 2020-07-09 DIAGNOSIS — M25552 Pain in left hip: Secondary | ICD-10-CM | POA: Diagnosis not present

## 2020-07-09 DIAGNOSIS — R7989 Other specified abnormal findings of blood chemistry: Secondary | ICD-10-CM | POA: Diagnosis present

## 2020-07-09 DIAGNOSIS — M7989 Other specified soft tissue disorders: Secondary | ICD-10-CM | POA: Diagnosis not present

## 2020-07-09 DIAGNOSIS — R059 Cough, unspecified: Secondary | ICD-10-CM | POA: Diagnosis not present

## 2020-07-09 HISTORY — DX: COVID-19: U07.1

## 2020-07-09 LAB — CBC WITH DIFFERENTIAL/PLATELET
Abs Immature Granulocytes: 0.05 10*3/uL (ref 0.00–0.07)
Basophils Absolute: 0 10*3/uL (ref 0.0–0.1)
Basophils Relative: 0 %
Eosinophils Absolute: 0 10*3/uL (ref 0.0–0.5)
Eosinophils Relative: 0 %
HCT: 29.7 % — ABNORMAL LOW (ref 36.0–46.0)
Hemoglobin: 10.1 g/dL — ABNORMAL LOW (ref 12.0–15.0)
Immature Granulocytes: 1 %
Lymphocytes Relative: 17 %
Lymphs Abs: 1.2 10*3/uL (ref 0.7–4.0)
MCH: 34.4 pg — ABNORMAL HIGH (ref 26.0–34.0)
MCHC: 34 g/dL (ref 30.0–36.0)
MCV: 101 fL — ABNORMAL HIGH (ref 80.0–100.0)
Monocytes Absolute: 0.5 10*3/uL (ref 0.1–1.0)
Monocytes Relative: 7 %
Neutro Abs: 5.2 10*3/uL (ref 1.7–7.7)
Neutrophils Relative %: 75 %
Platelets: 184 10*3/uL (ref 150–400)
RBC: 2.94 MIL/uL — ABNORMAL LOW (ref 3.87–5.11)
RDW: 11.8 % (ref 11.5–15.5)
WBC: 6.9 10*3/uL (ref 4.0–10.5)
nRBC: 0 % (ref 0.0–0.2)

## 2020-07-09 LAB — COMPREHENSIVE METABOLIC PANEL
ALT: 14 U/L (ref 0–44)
AST: 34 U/L (ref 15–41)
Albumin: 3.5 g/dL (ref 3.5–5.0)
Alkaline Phosphatase: 47 U/L (ref 38–126)
Anion gap: 9 (ref 5–15)
BUN: 34 mg/dL — ABNORMAL HIGH (ref 8–23)
CO2: 22 mmol/L (ref 22–32)
Calcium: 8.4 mg/dL — ABNORMAL LOW (ref 8.9–10.3)
Chloride: 103 mmol/L (ref 98–111)
Creatinine, Ser: 1.07 mg/dL — ABNORMAL HIGH (ref 0.44–1.00)
GFR, Estimated: 48 mL/min — ABNORMAL LOW (ref >60)
Glucose, Bld: 112 mg/dL — ABNORMAL HIGH (ref 70–99)
Potassium: 4.1 mmol/L (ref 3.5–5.1)
Sodium: 134 mmol/L — ABNORMAL LOW (ref 135–145)
Total Bilirubin: 0.8 mg/dL (ref 0.3–1.2)
Total Protein: 6.9 g/dL (ref 6.5–8.1)

## 2020-07-09 LAB — URINALYSIS, COMPLETE (UACMP) WITH MICROSCOPIC
Bilirubin Urine: NEGATIVE
Glucose, UA: NEGATIVE mg/dL
Ketones, ur: NEGATIVE mg/dL
Nitrite: POSITIVE — AB
Protein, ur: NEGATIVE mg/dL
Specific Gravity, Urine: 1.015 (ref 1.005–1.030)
pH: 5 (ref 5.0–8.0)

## 2020-07-09 LAB — SAMPLE TO BLOOD BANK

## 2020-07-09 LAB — RESP PANEL BY RT-PCR (FLU A&B, COVID) ARPGX2
Influenza A by PCR: NEGATIVE
Influenza B by PCR: NEGATIVE
SARS Coronavirus 2 by RT PCR: POSITIVE — AB

## 2020-07-09 LAB — TROPONIN I (HIGH SENSITIVITY)
Troponin I (High Sensitivity): 31 ng/L — ABNORMAL HIGH (ref <18)
Troponin I (High Sensitivity): 50 ng/L — ABNORMAL HIGH (ref <18)

## 2020-07-09 LAB — LACTATE DEHYDROGENASE: LDH: 195 U/L — ABNORMAL HIGH (ref 98–192)

## 2020-07-09 LAB — BRAIN NATRIURETIC PEPTIDE: B Natriuretic Peptide: 74.1 pg/mL (ref 0.0–100.0)

## 2020-07-09 LAB — TSH: TSH: 1.882 u[IU]/mL (ref 0.350–4.500)

## 2020-07-09 LAB — FERRITIN: Ferritin: 140 ng/mL (ref 11–307)

## 2020-07-09 LAB — MAGNESIUM: Magnesium: 1.7 mg/dL (ref 1.7–2.4)

## 2020-07-09 LAB — D-DIMER, QUANTITATIVE: D-Dimer, Quant: 2.35 ug/mL-FEU — ABNORMAL HIGH (ref 0.00–0.50)

## 2020-07-09 LAB — CK: Total CK: 205 U/L (ref 38–234)

## 2020-07-09 LAB — FIBRINOGEN: Fibrinogen: 379 mg/dL (ref 210–475)

## 2020-07-09 MED ORDER — ACETAMINOPHEN 650 MG RE SUPP: 650.0000 mg | Freq: Four times a day (QID) | RECTAL | Status: DC | PRN

## 2020-07-09 MED ORDER — SODIUM CHLORIDE 0.9 % IV SOLN
200.0000 mg | Freq: Once | INTRAVENOUS | Status: AC
  Administered 2020-07-09: 200 mg via INTRAVENOUS
  Filled 2020-07-09: qty 40

## 2020-07-09 MED ORDER — NYSTATIN 100000 UNIT/GM EX POWD
Freq: Two times a day (BID) | CUTANEOUS | Status: DC
  Filled 2020-07-09 (×2): qty 15

## 2020-07-09 MED ORDER — ACETAMINOPHEN 325 MG PO TABS
650.0000 mg | ORAL_TABLET | Freq: Once | ORAL | Status: AC
  Administered 2020-07-09: 650 mg via ORAL
  Filled 2020-07-09: qty 2

## 2020-07-09 MED ORDER — SODIUM CHLORIDE 0.9 % IV BOLUS
500.0000 mL | Freq: Once | INTRAVENOUS | Status: AC
  Administered 2020-07-09: 500 mL via INTRAVENOUS

## 2020-07-09 MED ORDER — METOPROLOL TARTRATE 5 MG/5ML IV SOLN: 2.5000 mg | INTRAVENOUS | Status: DC | PRN

## 2020-07-09 MED ORDER — ACETAMINOPHEN 325 MG PO TABS
650.0000 mg | ORAL_TABLET | Freq: Four times a day (QID) | ORAL | Status: DC | PRN
  Administered 2020-07-09 – 2020-07-19 (×4): 650 mg via ORAL
  Filled 2020-07-09 (×5): qty 2

## 2020-07-09 MED ORDER — PAROXETINE HCL 30 MG PO TABS
30.0000 mg | ORAL_TABLET | Freq: Every day | ORAL | Status: DC
  Administered 2020-07-10 – 2020-07-20 (×11): 30 mg via ORAL
  Filled 2020-07-09 (×12): qty 1

## 2020-07-09 MED ORDER — ASPIRIN EC 81 MG PO TBEC
81.0000 mg | DELAYED_RELEASE_TABLET | Freq: Every day | ORAL | Status: DC
  Administered 2020-07-10 – 2020-07-20 (×11): 81 mg via ORAL
  Filled 2020-07-09 (×11): qty 1

## 2020-07-09 MED ORDER — ALBUTEROL SULFATE HFA 108 (90 BASE) MCG/ACT IN AERS
1.0000 | INHALATION_SPRAY | RESPIRATORY_TRACT | Status: DC | PRN
  Filled 2020-07-09: qty 6.7

## 2020-07-09 MED ORDER — SODIUM CHLORIDE 0.9 % IV SOLN
1.0000 g | INTRAVENOUS | Status: DC
  Administered 2020-07-10 – 2020-07-11 (×2): 1 g via INTRAVENOUS
  Filled 2020-07-09: qty 1
  Filled 2020-07-09 (×2): qty 10
  Filled 2020-07-09: qty 1

## 2020-07-09 MED ORDER — MAGNESIUM SULFATE 2 GM/50ML IV SOLN
2.0000 g | Freq: Once | INTRAVENOUS | Status: AC
  Administered 2020-07-09: 2 g via INTRAVENOUS
  Filled 2020-07-09: qty 50

## 2020-07-09 MED ORDER — ANASTROZOLE 1 MG PO TABS
1.0000 mg | ORAL_TABLET | Freq: Every day | ORAL | Status: DC
  Administered 2020-07-10 – 2020-07-20 (×11): 1 mg via ORAL
  Filled 2020-07-09 (×13): qty 1

## 2020-07-09 MED ORDER — SODIUM CHLORIDE 0.9 % IV SOLN
1.0000 g | Freq: Once | INTRAVENOUS | Status: AC
  Administered 2020-07-09: 1 g via INTRAVENOUS
  Filled 2020-07-09: qty 10

## 2020-07-09 MED ORDER — METOPROLOL TARTRATE 25 MG PO TABS
25.0000 mg | ORAL_TABLET | Freq: Every day | ORAL | Status: DC
  Administered 2020-07-09 – 2020-07-12 (×4): 25 mg via ORAL
  Filled 2020-07-09 (×5): qty 1

## 2020-07-09 MED ORDER — SODIUM CHLORIDE 0.9 % IV SOLN
100.0000 mg | Freq: Every day | INTRAVENOUS | Status: AC
  Administered 2020-07-10 – 2020-07-11 (×2): 100 mg via INTRAVENOUS
  Filled 2020-07-09: qty 100
  Filled 2020-07-09: qty 20
  Filled 2020-07-09: qty 100

## 2020-07-09 NOTE — Progress Notes (Signed)
Brief note regarding plan, with full H&P to follow:  87 year old female who is admitted for suspected acute cystitis at presenting with 1 to 2 weeks of generalized weakness resulting in multiple ground-level mechanical falls in the absence of associated hemoptysis.  She notes associated dysuria and urinary urgency, which are new for her over the course of the last week.  Work-up reveals no evidence of acute fracture.  UA suggestive of UTI.  Started on Rocephin.  Fall precautions.  Gentle IV fluids. Will consult PT for the morning.    Babs Bertin, DO Hospitalist

## 2020-07-09 NOTE — ED Notes (Signed)
Patient is resting comfortably. Family at bedside.  

## 2020-07-09 NOTE — ED Notes (Signed)
ED NT contacted for transport to inpatient unit.

## 2020-07-09 NOTE — ED Notes (Signed)
Family at bedside, updated as to patient's status.

## 2020-07-09 NOTE — ED Notes (Signed)
Patient family at bedside and Dr. Quentin Cornwall made aware of COVID+ result.

## 2020-07-09 NOTE — ED Notes (Signed)
Patient repositioned for comfort. Patient reports improvement to pain with repositioning, and declined pain medication offered. Patient daughter at bedside expressing concern over patient left elbow, Dr. Quentin Cornwall notified. Xray ordered.

## 2020-07-09 NOTE — ED Notes (Signed)
ED Provider at bedside. 

## 2020-07-09 NOTE — ED Notes (Signed)
Dr. Quentin Cornwall made aware of new tachycardia. See new orders.

## 2020-07-09 NOTE — Consult Note (Signed)
Remdesivir - Pharmacy Brief Note   O:  ALT: 14 CXR: no acute process SpO2: 97% on RA   A/P:  Incidental finding. No respiratory symptoms.  Remdesivir 200 mg IVPB once followed by 100 mg IVPB daily x 2 days.  Can extend if respiratory symptoms progress  Dorothe Pea, PharmD, BCPS Clinical Pharmacist   07/09/2020 9:35 PM

## 2020-07-09 NOTE — ED Notes (Signed)
Patient verbalized consent to [facility] waiver.

## 2020-07-09 NOTE — ED Notes (Signed)
Patient transported to inpatient unit by ED NT.

## 2020-07-09 NOTE — ED Notes (Addendum)
Patient presents to the ED with complaints of L hip pain. [facility] reports the patient fell sometime last night after dark, and laid on the floor until this afternoon, when family came by to check on the patient. The patient reports she normally walks with a walker, but did not have it with her when she fell. The patient reports she was on the way to bed when she tripped and fell onto the hardwood floor. Patient reports she was unable to reach a phone. The patient denies dizziness, chest pain or headache before or after fall. Patient denies LOC. Patient is noted to have ecchymosis to posterior L hip. Patient noted to have ecchymosis to L elbow as well.

## 2020-07-09 NOTE — ED Notes (Signed)
Patient transported to X-ray 

## 2020-07-09 NOTE — ED Notes (Signed)
Patient is resting comfortably. Ultrasound at bedside.

## 2020-07-09 NOTE — ED Notes (Signed)
Admitting provider at bedside. Dr. Velia Meyer notified of COVID result and vital signs (HR). See new orders.

## 2020-07-09 NOTE — ED Provider Notes (Signed)
[facility] Emergency Department Provider Note    Event Date/Time   First MD Initiated Contact with Patient 07/09/20 1515     (approximate)  I have reviewed the triage vital signs and the nursing notes.   HISTORY  Chief Complaint Fall    HPI [name] is a 87 y.o. female presents to the ER for evaluation of fall.  Patient lives at home alone.  States that she remembers it was dark when she was last walking and then fell.  Son called EMS to come check on her as she had not heard from her in 24 hours.  She is complaining of some mild left hip pain.  She is uncertain as to how long she was down.  Denies any chest pain or pressure.  Does not recall the circumstances around her fall.  Past Medical History:  Diagnosis Date   Arthritis 04/13/09   Right knee   Carotid artery occlusion July 2005   Depression    Hyperlipidemia    Hypertension    Stroke [facility]) July 2005   Mini   Family History  Problem Relation Age of Onset   Heart disease Brother    Coronary artery disease Brother    Heart disease Brother    Heart disease Brother    Ovarian cancer Daughter 6       Died at 42   Breast cancer Neg Hx    Past Surgical History:  Procedure Laterality Date   CATARACT EXTRACTION W/ INTRAOCULAR LENS  IMPLANT, BILATERAL     Patient Active Problem List   Diagnosis Date Noted   Stage 3 chronic kidney disease ([place]) 04/22/2020   Prediabetes 04/22/2020   Personal history of transient ischemic attack (TIA), and cerebral infarction without residual deficits 04/22/2020   Osteoarthritis 04/22/2020   Mixed hyperlipidemia 04/22/2020   Malnutrition ([place]) 04/22/2020   Major depression, single episode 04/22/2020   Edema 04/22/2020   Disorder of arteries and arterioles, unspecified ([place]) 04/22/2020   Dementia ([place]) 04/22/2020   Genetic testing 11/13/2018   Malignant neoplasm of central portion of left breast in female, estrogen receptor positive ([place])  10/26/2018   Osteopenia of lower leg 10/26/2018   Neck pain 05/05/2015   Occlusion and stenosis of carotid artery without mention of cerebral infarction 05/27/2012      Prior to Admission medications   Medication Sig Start Date End Date Taking? Authorizing Provider  amLODipine (NORVASC) 5 MG tablet Take 5 mg by mouth daily.     [provider]  anastrozole (ARIMIDEX) 1 MG tablet TAKE 1 TABLET(1 MG) BY MOUTH DAILY 01/05/20   Magrinat, Virgie Dad, MD  aspirin 81 MG tablet Take 81 mg by mouth daily.    [provider]  atorvastatin (LIPITOR) 20 MG tablet Take 20 mg by mouth daily.     [provider]  Cholecalciferol (VITAMIN D) 50 MCG (2000 UT) CAPS Take 1 capsule by mouth once a week.    [provider]  clonazePAM (KLONOPIN) 0.5 MG tablet Take 0.5 mg by mouth 2 (two) times daily.     [provider]  metoprolol tartrate (LOPRESSOR) 25 MG tablet Take 25 mg by mouth daily.  06/12/17   [provider]  PARoxetine (PAXIL) 30 MG tablet Take 30 mg by mouth daily.     [provider]  Probiotic Product (PROBIOTIC-10 PO) Take 1 tablet by mouth daily.    [provider]  saccharomyces boulardii (FLORASTOR) 250 MG capsule Take  250 mg by mouth daily.    [provider]  tiZANidine (ZANAFLEX) 2 MG tablet Take 2-4 mg by mouth every 8 (eight) hours as needed. 04/11/20   [provider]  traMADol (ULTRAM) 50 MG tablet Take 50 mg by mouth every 6 (six) hours as needed for moderate pain.    [provider]    Allergies Penicillins, Sulfa antibiotics, Codeine, Penicillin g, Prednisone, and Sulfamethoxazole-trimethoprim    Social History Social History   Tobacco Use   Smoking status: Never   Smokeless tobacco: Former    Types: Snuff  Vaping Use   Vaping Use: Never used  Substance Use Topics   Alcohol use: No    Alcohol/week: 0.0 standard drinks   Drug use: No    Review of Systems Patient denies  headaches, rhinorrhea, blurry vision, numbness, shortness of breath, chest pain, edema, cough, abdominal pain, nausea, vomiting, diarrhea, dysuria, fevers, rashes or hallucinations unless otherwise stated above in HPI. ____________________________________________   PHYSICAL EXAM:  VITAL SIGNS: Vitals:   07/09/20 1530 07/09/20 1713  BP: (!) 169/75 (!) 166/79  Pulse: 80 (!) 116  Resp: (!) 21 19  Temp:    SpO2: 93% 96%    Constitutional: Alert and oriented.  Eyes: Conjunctivae are normal.  Head: Atraumatic. Nose: No congestion/rhinnorhea. Mouth/Throat: Mucous membranes are moist.   Neck: No stridor. Painless ROM.  Cardiovascular: Normal rate, regular rhythm. Grossly normal heart sounds.  Good peripheral circulation. Respiratory: Normal respiratory effort.  No retractions. Lungs CTAB. Gastrointestinal: Soft and nontender. No distention. No abdominal bruits. No CVA tenderness. Genitourinary:  Musculoskeletal: Contusion ecchymosis in the left gluteal region.  No shortening or rotational deformity to lower extremities.  Well-perfused distally.  No lower extremity tenderness nor edema.  No joint effusions. Neurologic:  Normal speech and language. No gross focal neurologic deficits are appreciated. No facial droop Skin:  Skin is warm, dry and intact. No rash noted. Psychiatric: Mood and affect are normal. Speech and behavior are normal.  ____________________________________________   LABS (all labs ordered are listed, but only abnormal results are displayed)  Results for orders placed or performed during the hospital encounter of 07/09/20 (from the past 24 hour(s))  CBC with Differential/Platelet     Status: Abnormal   Collection Time: 07/09/20  3:05 PM  Result Value Ref Range   WBC 6.9 4.0 - 10.5 K/uL   RBC 2.94 (L) 3.87 - 5.11 MIL/uL   Hemoglobin 10.1 (L) 12.0 - 15.0 g/dL   HCT 29.7 (L) 36.0 - 46.0 %   MCV 101.0 (H) 80.0 - 100.0 fL   MCH 34.4 (H) 26.0 - 34.0 pg   MCHC 34.0 30.0  - 36.0 g/dL   RDW 11.8 11.5 - 15.5 %   Platelets 184 150 - 400 K/uL   nRBC 0.0 0.0 - 0.2 %   Neutrophils Relative % 75 %   Neutro Abs 5.2 1.7 - 7.7 K/uL   Lymphocytes Relative 17 %   Lymphs Abs 1.2 0.7 - 4.0 K/uL   Monocytes Relative 7 %   Monocytes Absolute 0.5 0.1 - 1.0 K/uL   Eosinophils Relative 0 %   Eosinophils Absolute 0.0 0.0 - 0.5 K/uL   Basophils Relative 0 %   Basophils Absolute 0.0 0.0 - 0.1 K/uL   Immature Granulocytes 1 %   Abs Immature Granulocytes 0.05 0.00 - 0.07 K/uL  Comprehensive metabolic panel     Status: Abnormal   Collection Time: 07/09/20  3:05 PM  Result Value Ref Range  Sodium 134 (L) 135 - 145 mmol/L   Potassium 4.1 3.5 - 5.1 mmol/L   Chloride 103 98 - 111 mmol/L   CO2 22 22 - 32 mmol/L   Glucose, Bld 112 (H) 70 - 99 mg/dL   BUN 34 (H) 8 - 23 mg/dL   Creatinine, Ser 1.07 (H) 0.44 - 1.00 mg/dL   Calcium 8.4 (L) 8.9 - 10.3 mg/dL   Total Protein 6.9 6.5 - 8.1 g/dL   Albumin 3.5 3.5 - 5.0 g/dL   AST 34 15 - 41 U/L   ALT 14 0 - 44 U/L   Alkaline Phosphatase 47 38 - 126 U/L   Total Bilirubin 0.8 0.3 - 1.2 mg/dL   GFR, Estimated 48 (L) >60 mL/min   Anion gap 9 5 - 15  CK     Status: None   Collection Time: 07/09/20  3:05 PM  Result Value Ref Range   Total CK 205 38 - 234 U/L  Sample to Blood Bank     Status: None   Collection Time: 07/09/20  3:05 PM  Result Value Ref Range   Blood Bank Specimen SAMPLE AVAILABLE FOR TESTING    Sample Expiration      07/12/2020,2359 Performed at [facility], [address] [place],  [address]   Urinalysis, Complete w Microscopic Urine, Clean Catch     Status: Abnormal   Collection Time: 07/09/20  5:11 PM  Result Value Ref Range   Color, Urine YELLOW (A) YELLOW   APPearance CLOUDY (A) CLEAR   Specific Gravity, Urine 1.015 1.005 - 1.030   pH 5.0 5.0 - 8.0   Glucose, UA NEGATIVE NEGATIVE mg/dL   Hgb urine dipstick SMALL (A) NEGATIVE   Bilirubin Urine NEGATIVE NEGATIVE   Ketones, ur NEGATIVE  NEGATIVE mg/dL   Protein, ur NEGATIVE NEGATIVE mg/dL   Nitrite POSITIVE (A) NEGATIVE   Leukocytes,Ua LARGE (A) NEGATIVE   RBC / HPF 0-5 0 - 5 RBC/hpf   WBC, UA 21-50 0 - 5 WBC/hpf   Bacteria, UA RARE (A) NONE SEEN   Squamous Epithelial / LPF 0-5 0 - 5   ____________________________________________  EKG My review and personal interpretation at Time: 15:08   Indication: fall  Rate: 90  Rhythm: sinus Axis: normal Other: normal intervals, occasional pvc ____________________________________________  RADIOLOGY  I personally reviewed all radiographic images ordered to evaluate for the above acute complaints and reviewed radiology reports and findings.  These findings were personally discussed with the patient.  Please see medical record for radiology report.  ____________________________________________   PROCEDURES  Procedure(s) performed:  Procedures    Critical Care performed: no ____________________________________________   INITIAL IMPRESSION / ASSESSMENT AND PLAN / ED COURSE  Pertinent labs & imaging results that were available during my care of the patient were reviewed by me and considered in my medical decision making (see chart for details).   DDX: Electrolyte abn, dysrhythmia, fracture, contusion, SAH, IPH, SDH, sepsis, rhabdo  Aradia Estey Shankles is a 87 y.o. who presents to the ED with above presenting complaints with increasing falls generalized weakness.  She is afebrile well perfused does have some lower extremity mild swelling.  Abdominal exam soft and benign.  Has large hematoma to the left hip.  Compartment is soft.  Neurovascular intact distally.  CT imaging radiographs ordered for the above differential.  Will check blood work.  Clinical Course as of 07/09/20 1757  Nancy Fetter Jul 09, 2020  1753 Urinalysis does appear consistent with acute cystitis.  Daughter now at bedside states that she has been having increasing weakness frequent falls increased polyuria and  reporting some discomfort.  No flank pain.  No hematuria.  Doubt stone.  Given age and presentation will order IV fluids IV Rocephin as well as discuss case with hospitalist for admission. [PR]    Clinical Course User Index [PR] Merlyn Lot, MD    The patient was evaluated in Emergency Department today for the symptoms described in the history of present illness. He/she was evaluated in the context of the global COVID-19 pandemic, which necessitated consideration that the patient might be at risk for infection with the SARS-CoV-2 virus that causes COVID-19. Institutional protocols and algorithms that pertain to the evaluation of patients at risk for COVID-19 are in a state of rapid change based on information released by regulatory bodies including the CDC and federal and state organizations. These policies and algorithms were followed during the patient's care in the ED.  As part of my medical decision making, I reviewed the following data within the [facility] notes reviewed and incorporated, Labs reviewed, notes from prior ED visits and [place] Controlled Substance Database   ____________________________________________   FINAL CLINICAL IMPRESSION(S) / ED DIAGNOSES  Final diagnoses:  Acute cystitis without hematuria  Weakness  Frequent falls      NEW MEDICATIONS STARTED DURING THIS VISIT:  New Prescriptions   No medications on file     Note:  This document was prepared using Dragon voice recognition software and may include unintentional dictation errors.    Merlyn Lot, MD 07/09/20 [phone]

## 2020-07-09 NOTE — ED Triage Notes (Signed)
Patient arrives with [facility]. Patient reports L hip pain after a fall last night. EMS report family reported patient has hx of dementia, but patient alert, oriented x4 on arrival. Patient received 55mcg Fentanyl en route by EMS.

## 2020-07-09 NOTE — ED Notes (Signed)
Patient is resting comfortably. Family at bedside updated on admission status and pending transition to inpatient unit.

## 2020-07-09 NOTE — H&P (Signed)
History and Physical    PLEASE NOTE THAT DRAGON DICTATION SOFTWARE WAS USED IN THE CONSTRUCTION OF THIS NOTE.   [name] LKT:[id number] DOB: 1924-12-18 DOA: 07/09/2020  PCP: Kelton Pillar, MD Patient coming from: home   I have personally briefly reviewed patient's old medical records in [place]  Chief Complaint: Generalized weakness  HPI: [name] is a 87 y.o. female with medical history significant for essential hypertension who is admitted to [facility] on 07/09/2020 with acute cystitis after presenting from home to [facility] ED complaining of generalized weakness.   The following history is provided by my discussions with the patient as well as my discussions with the patient's daughter (POA) who was present at bedside, in addition to discussions with the emergency department physician and via chart review.  The patient reports 2 weeks of progressive generalized weakness in the absence of any associated acute focal weakness, acute focal numbness, paresthesias, dysarthria, facial droop, acute change in vision, dysphagia, or vertigo.  However, she does note associated dysuria and increase in urinary urgency over the last 7 to 10 days, which represent new symptoms for her.  Denies any associated abdominal pain, diarrhea, melena, or hematochezia.  She also denies any recent nausea/vomiting.  Additionally, she reports new onset rhinitis/rhinorrhea associated with new onset nonproductive cough over the last 5 to 7 days.  Denies any associated shortness of breath, orthopnea, PND.  She also denies any associated chest pain, diaphoresis, palpitations, presyncope or syncope.  Not associate any wheezing, hemoptysis, new lower extremity erythema, or calf tenderness. She denies any associated subjective fever, chills, rigors, or generalized myalgias.  No recent headache, neck stiffness, sore throat, or rash.  No recent traveling or known COVID-19 exposures.  She  does however report over the last few days new onset edema in the bilateral lower extremities, right slightly worse than left.   Denies any chronic underlying pulmonary pathology, and reports that she is a lifelong non-smoker.  Lives at home by herself, but has multiple children in the area that routinely if not daily check on her.  In the setting of the above generalized weakness, and the patient reports that she has experienced at least 2 ground-level mechanical falls as a result of tripping over the course of the last 10 days, with the most recent such episode occurring yesterday.  She reports that she did not hit her head as a component of this fall, and denies any associated loss of consciousness.  In the setting of this generalized weakness in the absence of any acute focal weakness, she reports that she remained on the floor for 2 to 3 hours at the conclusion of yesterday's fall until she had sufficient energy to extricate herself from the floor.  She denies any ensuing generalized myalgias, but reports mild left hip discomfort that started as a result of yesterday's fall.  Reports that this discomfort worsens with ambulation, but does not prevent her from being able to bear weight or ambulate.  Denies any numbness or paresthesias associated with the left lower extremity.     ED Course:  Vital signs in the ED were notable for the following: Tetramex 98.0; heart rate 80-1 07, blood pressure 145/60 -166/79; respiratory rate 16-21; oxygen saturation 95 to 97% on room air.  Labs were notable for the following: CMP was notable for the following: Sodium 134, bicarbonate 22, anion gap 9, BUN 34, creatinine 1.07, glucose 112, and liver enzymes were found to be within  normal limits.  High-sensitivity troponin I noted to be 31, without any prior troponin values available for point comparison.  BNP 74.  CPK 205.  CBC notable for white blood cell count of 7000.  Urinalysis showed 21-50 white blood cells,  large leukocyte Estrace, and was nitrate positive.  COVID-19 PCR performed in the ED today was found to be positive.   EKG shows sinus tachycardia with heart rate 109, normal intervals, nonspecific T wave inversion in aVL, no evidence of ST changes, including no evidence of ST elevation.  Noncontrast CT head showed no evidence of acute intracranial process, including no evidence of intracranial hemorrhage or acute ischemic infarct.  Chest x-ray shows no evidence of acute cardiopulmonary process, including no evidence of infiltrate, edema, effusion, or pneumothorax.  Plain films of the left hip/pelvis as well as the left elbow show no evidence of acute fracture.  Venous ultrasound of the bilateral lower extremity shows no evidence of DVT.  While in the ED, the following were administered: Rocephin 1 g IV x1, normal saline x500 cc bolus.    Review of Systems: As per HPI otherwise 10 point review of systems negative.   Past Medical History:  Diagnosis Date   Arthritis 04/13/09   Right knee   Carotid artery occlusion July 2005   Depression    Hyperlipidemia    Hypertension    Stroke [facility]) July 2005   Mini    Past Surgical History:  Procedure Laterality Date   CATARACT EXTRACTION W/ INTRAOCULAR LENS  IMPLANT, BILATERAL      Social History:  reports that she has never smoked. She has quit using smokeless tobacco.  Her smokeless tobacco use included snuff. She reports that she does not drink alcohol and does not use drugs.   Allergies  Allergen Reactions   Fluogen [Influenza Virus Vaccine]     Other reaction(s): didn't tolerate the high dose shot   Penicillins    Sulfa Antibiotics    Codeine Other (See Comments)   Penicillin G Rash   Prednisone Palpitations   Sulfamethoxazole-Trimethoprim Rash    Family History  Problem Relation Age of Onset   Heart disease Brother    Coronary artery disease Brother    Heart disease Brother    Heart disease Brother    Ovarian cancer Daughter  5       Died at 30   Breast cancer Neg Hx     Family history reviewed and not pertinent    Prior to Admission medications   Medication Sig Start Date End Date Taking? Authorizing Provider  anastrozole (ARIMIDEX) 1 MG tablet TAKE 1 TABLET(1 MG) BY MOUTH DAILY 01/05/20  Yes Magrinat, Virgie Dad, MD  aspirin 81 MG tablet Take 81 mg by mouth daily.   Yes [provider]  hydrochlorothiazide (HYDRODIURIL) 12.5 MG tablet Take 12.5 mg by mouth daily. 06/29/20  Yes [provider]  metoprolol tartrate (LOPRESSOR) 25 MG tablet Take 25 mg by mouth daily.  06/12/17  Yes [provider]  PARoxetine (PAXIL) 30 MG tablet Take 30 mg by mouth daily.    Yes [provider]  Probiotic Product (PROBIOTIC-10 PO) Take 1 tablet by mouth daily.   Yes [provider]  amLODipine (NORVASC) 5 MG tablet Take 5 mg by mouth daily.  Patient not taking: Reported on 07/09/2020    [provider]  atorvastatin (LIPITOR) 20 MG tablet Take 20 mg by mouth daily.  Patient not taking: Reported on 07/09/2020    [provider]  Cholecalciferol (VITAMIN D) 50 MCG (2000 UT) CAPS Take 1 capsule by mouth once a week. Patient not taking: Reported on 07/09/2020    [provider]  clonazePAM (KLONOPIN) 0.5 MG tablet Take 0.5 mg by mouth 2 (two) times daily.     [provider]  saccharomyces boulardii (FLORASTOR) 250 MG capsule Take 250 mg by mouth daily.    [provider]  tiZANidine (ZANAFLEX) 2 MG tablet Take 2-4 mg by mouth every 8 (eight) hours as needed. Patient not taking: Reported on 07/09/2020 04/11/20   [provider]  traMADol (ULTRAM) 50 MG tablet Take 50 mg by mouth every 6 (six) hours as needed for moderate pain. Patient not taking: Reported on 07/09/2020    [provider]     Objective    Physical Exam: Vitals:   07/09/20 1513 07/09/20 1530 07/09/20 1713  BP: (!) 161/72 (!) 169/75 (!) 166/79  Pulse: 86 80  (!) 116  Resp: 16 (!) 21 19  Temp: 97.9 F (36.6 C)    TempSrc: Oral    SpO2: 97% 93% 96%  Weight: 72.5 kg    Height: 5' 5"  (1.651 m)      General: appears to be stated age; alert, oriented Skin: warm, dry Head:  AT/[place] Mouth:  Oral mucosa membranes appear moist, normal dentition Neck: supple; trachea midline Heart:  RRR; did not appreciate any M/R/G Lungs: CTAB, did not appreciate any wheezes, rales, or rhonchi Abdomen: + BS; soft, ND, NT Vascular: 2+ pedal pulses b/l; 2+ radial pulses b/l Extremities: trace to 1+ edema in b/l LE's, R > L; no muscle wasting Neuro: strength and sensation intact in upper and lower extremities b/l    Labs on Admission: I have personally reviewed following labs and imaging studies  CBC: Recent Labs  Lab 07/09/20 1505  WBC 6.9  NEUTROABS 5.2  HGB 10.1*  HCT 29.7*  MCV 101.0*  PLT 932   Basic Metabolic Panel: Recent Labs  Lab 07/09/20 1505  NA 134*  K 4.1  CL 103  CO2 22  GLUCOSE 112*  BUN 34*  CREATININE 1.07*  CALCIUM 8.4*   GFR: Estimated Creatinine Clearance: 32.8 mL/min (A) (by C-G formula based on SCr of 1.07 mg/dL (H)). Liver Function Tests: Recent Labs  Lab 07/09/20 1505  AST 34  ALT 14  ALKPHOS 47  BILITOT 0.8  PROT 6.9  ALBUMIN 3.5   No results for input(s): LIPASE, AMYLASE in the last 168 hours. No results for input(s): AMMONIA in the last 168 hours. Coagulation Profile: No results for input(s): INR, PROTIME in the last 168 hours. Cardiac Enzymes: Recent Labs  Lab 07/09/20 1505  CKTOTAL 205   BNP (last 3 results) No results for input(s): PROBNP in the last 8760 hours. HbA1C: No results for input(s): HGBA1C in the last 72 hours. CBG: No results for input(s): GLUCAP in the last 168 hours. Lipid Profile: No results for input(s): CHOL, HDL, LDLCALC, TRIG, CHOLHDL, LDLDIRECT in the last 72 hours. Thyroid Function Tests: No results for input(s): TSH, T4TOTAL, FREET4, T3FREE, THYROIDAB in the last 72  hours. Anemia Panel: No results for input(s): VITAMINB12, FOLATE, FERRITIN, TIBC, IRON, RETICCTPCT in the last 72 hours. Urine analysis:    Component Value Date/Time   COLORURINE YELLOW (A) 07/09/2020 1711   APPEARANCEUR CLOUDY (A) 07/09/2020 1711   LABSPEC 1.015 07/09/2020 1711   PHURINE 5.0 07/09/2020 1711   GLUCOSEU NEGATIVE 07/09/2020 1711   HGBUR SMALL (A) 07/09/2020 1711  BILIRUBINUR NEGATIVE 07/09/2020 1711   [place] 07/09/2020 1711   PROTEINUR NEGATIVE 07/09/2020 1711   NITRITE POSITIVE (A) 07/09/2020 1711   LEUKOCYTESUR LARGE (A) 07/09/2020 1711    Radiological Exams on Admission: DG Elbow Complete Left  Result Date: 07/09/2020 CLINICAL DATA:  Fall.  Left elbow pain. EXAM: LEFT ELBOW - COMPLETE 3+ VIEW COMPARISON:  None. FINDINGS: There is no evidence of fracture, dislocation, or joint effusion. There is no evidence of arthropathy or other focal bone abnormality. Soft tissues are unremarkable. IMPRESSION: Negative. Electronically Signed   By: Dorise Bullion III M.D   On: 07/09/2020 17:44   CT Head Wo Contrast  Result Date: 07/09/2020 CLINICAL DATA:  [name] sometime last night. Late on the floor till this afternoon. EXAM: CT HEAD WITHOUT CONTRAST TECHNIQUE: Contiguous axial images were obtained from the base of the skull through the vertex without intravenous contrast. COMPARISON:  Head CT, 04/10/2020. FINDINGS: Brain: Choose 1 There is age appropriate ventricular and sulcal enlargement. Small area of encephalomalacia noted along the lateral right frontal lobe consistent with an old infarct, stable from the prior head CT. Bilateral white matter hypoattenuation is noted, also stable consistent with moderate chronic microvascular ischemic change. Vascular: No hyperdense vessel or unexpected calcification. Skull: Normal. Negative for fracture or focal lesion. Sinuses/Orbits: Visualized globes and orbits are unremarkable. There is dependent fluid in the right maxillary sinus,  mild-to-moderate mucosal thickening lining the ethmoid air cells, mild right anterior sphenoid and inferior frontal sinus mucosal thickening. Mastoid air cells are clear. Other: None. IMPRESSION: 1. No acute intracranial abnormalities. 2. 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 3. Sinus disease as described including a right maxillary sinus air-fluid level. Electronically Signed   By: Lajean Manes M.D.   On: 07/09/2020 15:56   DG Chest Portable 1 View  Result Date: 07/09/2020 CLINICAL DATA:  Fall.  Left hip pain. EXAM: PORTABLE CHEST 1 VIEW COMPARISON:  March 13, 2019 FINDINGS: Cardiomediastinal silhouette is stable. Stable mild cardiomegaly. Stable tortuous thoracic aorta. No pneumothorax. No nodules or masses. No focal infiltrates. No acute abnormalities. IMPRESSION: No active disease. Electronically Signed   By: Dorise Bullion III M.D   On: 07/09/2020 16:21   DG Hip Unilat W or Wo Pelvis 2-3 Views Left  Result Date: 07/09/2020 CLINICAL DATA:  Pain after fall EXAM: DG HIP (WITH OR WITHOUT PELVIS) 2-3V LEFT COMPARISON:  None. FINDINGS: Mild irregularity along the lateral aspect of the left femoral head is likely a small osteophyte. No fracture or dislocation. Severe degenerative changes in the right hip with near complete loss of joint space and bony sclerosis. No other acute abnormalities. IMPRESSION: No acute fractures. Electronically Signed   By: Dorise Bullion III M.D   On: 07/09/2020 16:20     EKG: Independently reviewed, with result as described above.    Assessment/Plan   [name] is a 87 y.o. female with medical history significant for essential hypertension who is admitted to [facility] on 07/09/2020 with acute cystitis after presenting from home to [facility] ED complaining of generalized weakness.    Principal Problem:   Acute cystitis Active Problems:   Generalized weakness   Fall  at home, initial encounter   COVID-19 virus infection   Lower extremity edema   Elevated troponin   Hypertension      #) Acute cystitis: Diagnosis on the basis of presenting 7 days of new onset dysuria with urinary urgency,  and presenting urinalysis associated with significant pyuria, large leukocyte esterase, nitrate positive, no evidence of contamination.  Distribution of infection appears to be limited to acute cystitis, as the patient denies any associated flank tenderness, and physical exam reveals negative Earnie Larsson sign, thereby reducing chances of associated pyelonephritis.  While she has mildly tachycardic, no additional SIRS criteria met at this time for the patient to be considered septic, and no evidence of hypotension thus far.  Started on Rocephin in the ED.  Suspect contribution to the patient's primary complaint of generalized weakness as a consequence of associated physiologic stress relating to this apparent uncomplicated UTI.  Plan: Add on urine culture and check blood cultures x2 followed by continuation of Rocephin.  Repeat CBC with differential in the morning.      #) Generalized weakness: New onset generalized weakness in the absence of any acute focal weakness.  Appears to be contributory to the patient's multiple ground-level mechanical falls over the last several days.  In the absence of any acute focal weakness, and in the absence of any additional acute focal neurologic deficits, presentation appears to be less suggestive of acute stroke, with presenting CT head showing no evidence of acute intracranial process.  Suspect multifactorial contributions from physiologic stress relating to not only acute cystitis, but likely also from COVID-19 infection with positive finding in the ED today in the context of patient's report of new onset rhinitis, rhinorrhea, in nonproductive cough over the last 5 to 7 days, as further detailed below.  No evidence of additional underlying  infectious process at this time, and chest x-ray shows no evidence of acute cardiopulmonary process.  Not elevated CPK.   Plan: Further evaluation management of acute cystitis, including IV antibiotics, as above, in addition to further evaluation management COVID-19 infection, as further detailed below.  Physical therapy consult has been placed for the morning check TSH.  Fall precautions.  Repeat CMP and CBC in the morning.       #) Ground-level mechanical falls: At least 2 ground-level mechanical falls over the course the last week is an apparent consequence of generalized weakness over that time, as further described above.  Not associate with any loss of consciousness or hitting of head.  Of note, CT head shows no evidence of acute intracranial process, including no evidence of intracranial hemorrhage.  Additionally, no evidence of a consequential acute fracture, including plain films of the left hip/pelvis as well as left elbow showing no evidence of acute fracture.  Well engage in further evaluation management of contributory generalized weakness as a consequence of multiple underlying infectious processes, as further detailed above.  Of note, CPK checked today and found to be nonelevated at 200.  Plan: Further evaluation management of acute cystitis as well as COVID-19 infection.  Fall precautions.  Physical therapy consult.  Check TSH.      #) COVID-19 infection: diagnosis on the basis of 5-6 days of new onset rhinitis, rhinorrhea, new onset cough, with nasopharyngeal COVID-19 PCR performed in the ED this evening found to be positive. Of note, presentation does not appear to be associated with acute hypoxic respiratory distress/failure, with patient maintaining O2 sats greater than 94% on room air. Overall, it does not appear that criteria are met at the present time for patient's COVID-19 infection to be considered severe in nature. Consequently, there does not appear to be an indication at  this time for initiation of dexamethasone per treatment guidance recommendations from [facility]'s Covid Treatment Guidelines. It is  important to note that COVID-19 infection does not represent the admission diagnosis for this hospitalization, and that the patient is not being hospitalized specifically for additional evaluation and management of COVID-19. However, given that there is evidence that initiation of remdesivir early in the course of infection can decrease the likelihood of progression of the severity of COVID-19 in those that are considered to be at high risk for a more complicated course of the infection, it is important to evaluate if this patient meets criteria for a brief course of remdesivir on this basis.   In the setting of her age, this patient meets criteria to be considered high risk for a more complicated clinical course of COVID-19 infection, including increased probability for progression of the severity associated with their infectious course. Therefore, even though the patient is not being admitted specifically for further evaluation and management of COVID-19 infection, given that the duration since onset of patient's respiratory symptoms is less than 7 days in the context of the presence of high risk criteria, indications are met for initiation of a 3-day course of remdesivir due to associated benefit of decreased risk for progression of severity of their COVID-19 infection with early initiation of remdesivir in this setting, per treatment guidance recommendations from [place]'s Covid Treatment Guidelines.  No known chronic underlying pulmonary pathology. Denies any known or suspected COVID-19 exposures.  No known history of underlying diabetes.  Chest x-ray shows no evidence of acute cardiopulmonary process.    Plan: Airborne and contact precautions. Monitor continuous pulse oximetry and monitor on telemetry. prn supplemental O2 to maintain O2 sats greater than or equal to  94%. Proning protocol initiated. PRN albuterol inhaler. Start remdesivir, as above. Refraining from initiation of dexamethasone, as above, although the patient is conveyed that she would be amenable to initiation of dexamethasone if indications for such are subsequently met. Check inflammatory markers (fibrinogen, d dimer or fibrin derivatives, crp, ferritin, LDH) in the morning. Check serum magnesium and phosphorus levels. Check CMP and CBC in the morning. Flutter valve and incentive spirometry.         #) Bilateral lower extremity edema: The patient reports new onset edema in the bilateral lower extremities, right greater than left, over the last several days, giving that this is a new finding for her.  No evidence of associated tenderness, increased warmth, erythema to suggest underlying cellulitis.  Additionally, particular in the context of new diagnosis of COVID-19 with its associated prothrombotic state, venous ultrasound of the bilateral lower extremities were obtained in the ED today and found to show no evidence of DVT.  Unclear etiology at this time.  Differential includes relative increase in pulmonary artery pressure in the setting of acute COVID-19 infection, potentially resulting in right-sided congestion.  No documented history of CHF, and overall, clinically and radiographically, presentation appears less suggestive of acutely decompensated CHF, particularly in the setting of a clear chest x-ray as well as nonelevated BNP.  No history of liver disease or recent traveling.  Renal function appears at baseline, in the context of no known history of nephrotic syndrome.  Plan: Monitor strict I's and O's and daily weights.  I placed a nursing communication order requesting that the patient's bilateral lower extremities be elevated to assist with gravity induced drainage.  Echocardiogram is been ordered for the morning.  Repeat BMP in the morning.  Further evaluation and management of acute  COVID-19 infection, as above.  Trend troponin.       #) Elevated troponin:  mildly elevated initial troponin of 31. No prior high sensitivity troponin I value available for point of comparison.  Suspect that this mildly elevated troponin is on the basis of supply demand mismatch in the setting of COVID-19 infection and associated new onset cough, as well as relative decline in diastolic/coronary artery filling time as a consequence of relative tachycardia on the basis of multiple presenting infections, as opposed to representing a type I process due to acute plaque rupture.  EKG shows nonspecific T wave inversion in aVL, but otherwise shows no evidence of acute ischemic changes, including no evidence of ST changes, and chest x-ray shows no evidence of acute cardiopulmonary process, including no evidence of pneumothorax or additionally on presentation is not associate with any chest pain.  Overall, ACS felt to be less likely relative to type II supply demand mismatch, as above, but will closely monitor on telemetry overnight while treating suspected underlying COVID-19/UTI, as further detailed above.  Additionally, given new onset of edema in the bilateral lower extremities, will check echocardiogram in the morning.  Presentation less suggestive of acute pulmonary embolism.   Plan: Continue to trend troponin Monitor on telemetry. PRN EKG for development of chest pain. PRN sublingual nitroglycerin for CP. Check serum Mg level and repeat BMP in the morning, with prn supplementation to maintain Mg and potassium levels greater than or equal to 2.0 and 4.0, respectively, to further reduce risk of ventricular arrhythmia. Repeat CBC in the AM. Additional evaluation and management of presenting acute cystitis/COVID-19 infection as suspected driving force behind mildly elevated troponin, as above.  Resume home aspirin.  Echocardiogram ordered for the morning, as above.     #) Essential hypertension: On HCTZ and  Lopressor at home.  Presenting systolic blood pressures in the 140s mmhg.   Plan: In the setting of multiple presenting infectious processes, will cautiously/gradually resume home antihypertensive regimen.  Specifically will resume home Lopressor, but continue to hold home HCTZ for now.  Close monitoring of ensuing blood pressure via routine vital signs.      DVT prophylaxis: scd's  Code Status: Per my discussions with the patient as well as her daughter (poa), the patient wishes to be DNR/DNI Family Communication: Case was discussed with the patient's daughter, who was present at bedside Disposition Plan: Per Rounding Team Consults called: none  Admission status: Inpatient; med telemetry     Of note, this patient was added by me to the following Admit List/Treatment Team: armcadmits.      PLEASE NOTE THAT DRAGON DICTATION SOFTWARE WAS USED IN THE CONSTRUCTION OF THIS NOTE.   [place] [facility] Pager [phone] From 6PM - 6AM  Otherwise, please contact night-coverage  [email or web address] Password [email or web address]   07/09/2020, 6:43 PM

## 2020-07-10 ENCOUNTER — Inpatient Hospital Stay: Payer: PPO

## 2020-07-10 ENCOUNTER — Inpatient Hospital Stay (HOSPITAL_COMMUNITY)
Admit: 2020-07-10 | Discharge: 2020-07-10 | Disposition: A | Discharge status: Home / Self Care | Payer: PPO | Attending: Internal Medicine | Admitting: Internal Medicine

## 2020-07-10 ENCOUNTER — Encounter: Payer: Self-pay | Admitting: Internal Medicine

## 2020-07-10 DIAGNOSIS — R600 Localized edema: Secondary | ICD-10-CM | POA: Diagnosis present

## 2020-07-10 DIAGNOSIS — D649 Anemia, unspecified: Secondary | ICD-10-CM

## 2020-07-10 DIAGNOSIS — R7989 Other specified abnormal findings of blood chemistry: Secondary | ICD-10-CM | POA: Diagnosis present

## 2020-07-10 DIAGNOSIS — R778 Other specified abnormalities of plasma proteins: Secondary | ICD-10-CM

## 2020-07-10 DIAGNOSIS — R748 Abnormal levels of other serum enzymes: Secondary | ICD-10-CM

## 2020-07-10 DIAGNOSIS — W19XXXA Unspecified fall, initial encounter: Secondary | ICD-10-CM

## 2020-07-10 DIAGNOSIS — R531 Weakness: Secondary | ICD-10-CM

## 2020-07-10 DIAGNOSIS — I10 Essential (primary) hypertension: Secondary | ICD-10-CM

## 2020-07-10 DIAGNOSIS — Y92009 Unspecified place in unspecified non-institutional (private) residence as the place of occurrence of the external cause: Secondary | ICD-10-CM

## 2020-07-10 DIAGNOSIS — U071 COVID-19: Secondary | ICD-10-CM | POA: Diagnosis present

## 2020-07-10 HISTORY — PX: TRANSTHORACIC ECHOCARDIOGRAM: SHX275

## 2020-07-10 LAB — ECHOCARDIOGRAM COMPLETE
AR max vel: 1.63 cm2
AV Area VTI: 1.87 cm2
AV Area mean vel: 1.68 cm2
AV Mean grad: 8 mmHg
AV Peak grad: 14.6 mmHg
Ao pk vel: 1.91 m/s
Area-P 1/2: 3.36 cm2
Height: 65 in
S' Lateral: 2.71 cm
Weight: 2557.34 oz

## 2020-07-10 LAB — COMPREHENSIVE METABOLIC PANEL
ALT: 14 U/L (ref 0–44)
AST: 37 U/L (ref 15–41)
Albumin: 2.7 g/dL — ABNORMAL LOW (ref 3.5–5.0)
Alkaline Phosphatase: 37 U/L — ABNORMAL LOW (ref 38–126)
Anion gap: 7 (ref 5–15)
BUN: 27 mg/dL — ABNORMAL HIGH (ref 8–23)
CO2: 23 mmol/L (ref 22–32)
Calcium: 8 mg/dL — ABNORMAL LOW (ref 8.9–10.3)
Chloride: 105 mmol/L (ref 98–111)
Creatinine, Ser: 1.02 mg/dL — ABNORMAL HIGH (ref 0.44–1.00)
GFR, Estimated: 51 mL/min — ABNORMAL LOW (ref >60)
Glucose, Bld: 104 mg/dL — ABNORMAL HIGH (ref 70–99)
Potassium: 3.8 mmol/L (ref 3.5–5.1)
Sodium: 135 mmol/L (ref 135–145)
Total Bilirubin: 0.5 mg/dL (ref 0.3–1.2)
Total Protein: 5.7 g/dL — ABNORMAL LOW (ref 6.5–8.1)

## 2020-07-10 LAB — PHOSPHORUS: Phosphorus: 3 mg/dL (ref 2.5–4.6)

## 2020-07-10 LAB — C-REACTIVE PROTEIN
CRP: 3.9 mg/dL — ABNORMAL HIGH (ref <1.0)
CRP: 5.6 mg/dL — ABNORMAL HIGH (ref <1.0)

## 2020-07-10 LAB — CBC
HCT: 24.3 % — ABNORMAL LOW (ref 36.0–46.0)
Hemoglobin: 8.1 g/dL — ABNORMAL LOW (ref 12.0–15.0)
MCH: 33.6 pg (ref 26.0–34.0)
MCHC: 33.3 g/dL (ref 30.0–36.0)
MCV: 100.8 fL — ABNORMAL HIGH (ref 80.0–100.0)
Platelets: 137 10*3/uL — ABNORMAL LOW (ref 150–400)
RBC: 2.41 MIL/uL — ABNORMAL LOW (ref 3.87–5.11)
RDW: 11.9 % (ref 11.5–15.5)
WBC: 4.5 10*3/uL (ref 4.0–10.5)
nRBC: 0 % (ref 0.0–0.2)

## 2020-07-10 LAB — FERRITIN: Ferritin: 137 ng/mL (ref 11–307)

## 2020-07-10 LAB — PROCALCITONIN: Procalcitonin: <0.1 ng/mL

## 2020-07-10 LAB — D-DIMER, QUANTITATIVE: D-Dimer, Quant: 1.51 ug/mL-FEU — ABNORMAL HIGH (ref 0.00–0.50)

## 2020-07-10 LAB — TROPONIN I (HIGH SENSITIVITY): Troponin I (High Sensitivity): 87 ng/L — ABNORMAL HIGH (ref <18)

## 2020-07-10 LAB — LACTATE DEHYDROGENASE: LDH: 151 U/L (ref 98–192)

## 2020-07-10 LAB — MAGNESIUM: Magnesium: 2.2 mg/dL (ref 1.7–2.4)

## 2020-07-10 LAB — FIBRINOGEN: Fibrinogen: 286 mg/dL (ref 210–475)

## 2020-07-10 MED ORDER — IPRATROPIUM-ALBUTEROL 20-100 MCG/ACT IN AERS
2.0000 | INHALATION_SPRAY | Freq: Four times a day (QID) | RESPIRATORY_TRACT | Status: DC
  Administered 2020-07-10 – 2020-07-20 (×31): 2 via RESPIRATORY_TRACT
  Filled 2020-07-10: qty 4

## 2020-07-10 MED ORDER — GUAIFENESIN-DM 100-10 MG/5ML PO SYRP: 10.0000 mL | ORAL_SOLUTION | ORAL | Status: DC | PRN

## 2020-07-10 MED ORDER — ASCORBIC ACID 500 MG PO TABS
500.0000 mg | ORAL_TABLET | Freq: Every day | ORAL | Status: DC
  Administered 2020-07-10 – 2020-07-20 (×11): 500 mg via ORAL
  Filled 2020-07-10 (×11): qty 1

## 2020-07-10 MED ORDER — IOHEXOL 350 MG/ML SOLN
60.0000 mL | Freq: Once | INTRAVENOUS | Status: AC | PRN
  Administered 2020-07-10: 60 mL via INTRAVENOUS

## 2020-07-10 MED ORDER — PANTOPRAZOLE SODIUM 40 MG PO TBEC
40.0000 mg | DELAYED_RELEASE_TABLET | Freq: Every day | ORAL | Status: DC
  Administered 2020-07-10 – 2020-07-20 (×11): 40 mg via ORAL
  Filled 2020-07-10 (×11): qty 1

## 2020-07-10 MED ORDER — LORATADINE 10 MG PO TABS
10.0000 mg | ORAL_TABLET | Freq: Every day | ORAL | Status: DC
  Administered 2020-07-10 – 2020-07-20 (×11): 10 mg via ORAL
  Filled 2020-07-10 (×11): qty 1

## 2020-07-10 MED ORDER — ZINC SULFATE 220 (50 ZN) MG PO CAPS
220.0000 mg | ORAL_CAPSULE | Freq: Every day | ORAL | Status: DC
  Administered 2020-07-10 – 2020-07-20 (×11): 220 mg via ORAL
  Filled 2020-07-10 (×11): qty 1

## 2020-07-10 NOTE — Progress Notes (Signed)
*  PRELIMINARY RESULTS* Echocardiogram 2D Echocardiogram has been performed.  Sherrie Sport 07/10/2020, 10:02 AM

## 2020-07-10 NOTE — Progress Notes (Addendum)
PROGRESS NOTE    [name]  VOJ:[id number] DOB: 1924-12-19 DOA: 07/09/2020 PCP: Kelton Pillar, MD    Chief Complaint  Patient presents with   Fall    Brief Narrative:  Patient 87 year old female history of hypertension presented to ED with acute cystitis, generalized weakness which has been progressive over the past 2 weeks with no focal deficits.  Patient does endorse urinary symptoms.  Patient also endorses rhinitis, rhinorrhea, new onset nonproductive cough over the last 5 to 7 days without any associated shortness of breath, orthopnea or PND.  Patient also with complaints of mechanical falls as a result of tripping over the course of the past 10 days with recent episode of occurring the day prior to admission.  Patient denied any syncopal episodes.  Patient seen in the ED high-sensitivity troponin elevated however patient asymptomatic.  COVID-19 PCR was positive.  Urinalysis consistent with UTI.  Chest x-ray unremarkable.  Patient placed empirically on IV antibiotics, given the fluid bolus, placed on 3-day course of IV remdesivir as patient met criteria for high risk for complicated clinical course of COVID-19 infection and increased probability for progression for severe disease.   Assessment & Plan:   Principal Problem:   Acute cystitis Active Problems:   Generalized weakness   Fall at home, initial encounter   COVID-19 virus infection   Lower extremity edema   Elevated troponin   Hypertension   #1 acute cystitis -Patient presented with a 7-day history of new onset dysuria, urinary urgency, generalized weakness. -Urinalysis consistent with UTI. -Patient with no flank tenderness. -Blood cultures pending with no growth to date. -Urine cultures pending. -IV Rocephin. -Follow.  2.  Generalized weakness/falls -Likely multifactorial secondary to UTI, positive COVID-19 infection. -Patient pancultured results pending -Continue IV Rocephin. -Continue IV  remdesivir. -PT/OT.  3.  COVID-19 infection -COVID-19 PCR positive. -Patient noted to have a 5 to 6-day history of new onset rhinitis, rhinorrhea, nonproductive cough. -Patient with sats noted greater 94% on room air. -Patient currently on 1.5 L nasal cannula with sats of 98%. -Hold off on steroids. -Due to patient's age and high risk for progression to severe COVID-19 infection patient placed on IV remdesivir x3 days as evidence noted and initiation of remdesivir early in the course of infection can decrease likelihood of progression of severity of illness. -Monitor respiratory status closely and if worsening may consider starting patient empirically on IV Solu-Medrol. -D-dimer noted to be elevated and as CT angiogram chest was negative for PE however did show bibasilar atelectasis. -Follow CRP, LDH, D-dimer. -Place on zinc, vitamin C, Combivent. -Pulmonary toilet. -Supportive care.  4.  Anemia -Patient with no overt bleeding. -Check an anemia panel. -Follow H&H. -Transfusion threshold hemoglobin < 7.  5.  Lower extremity edema -Patient with new onset lower extremity edema R > L. -Lower extremity Dopplers negative for DVT. -2D echo pending. -Patient with no history of liver disease or recent travel. -No history of nephrotic syndrome -Strict I's and O's -Daily weights.  6.  Elevated troponin -Patient mildly elevated troponin likely secondary to demand ischemia. -Patient denies any chest pain. -EKG with nonspecific T wave inversion in aVL otherwise no changes of ischemia noted. -ACS less likely. -2D echo pending.  7.  Hypertension -Hold HCTZ -Continue home regimen Lopressor.   DVT prophylaxis: SCDs Code Status: DNR Family Communication: Updated patient. Updated daughter Philomena Doheny via telephone.  Disposition:   Status is: Inpatient  Remains inpatient appropriate because:Inpatient level of care appropriate due to severity of illness  Dispo: The patient is from:  Home              Anticipated d/c is to:  TBD              Patient currently is not medically stable to d/c.   Difficult to place patient No       Consultants:  None  Procedures:  CT head 07/09/2020 CT angiogram chest 07/10/2020 Plain films of the elbow 07/09/2020 Chest x-ray 07/09/2020 Plain films of the left hip and pelvis 07/09/2020 Lower extremity Doppler 07/09/2020 2D echo 07/10/2020  Antimicrobials:  IV Rocephin 07/09/2020>>>>   Subjective: Sates she feels ok. No CP. No abd pain. Dysuria improving. No significant SOB.  Objective: Vitals:   07/10/20 0136 07/10/20 0401 07/10/20 0806 07/10/20 0836  BP: (!) 101/52 (!) 102/54 (!) 112/56 (!) 107/58  Pulse: 74 86 71 70  Resp: (!) 24 (!) 24 (!) 22 20  Temp: (!) 97.5 F (36.4 C) 98.1 F (36.7 C) 99.1 F (37.3 C) 98.3 F (36.8 C)  TempSrc: Oral Oral Oral Oral  SpO2: 99% 97% 97% 98%  Weight:      Height:        Intake/Output Summary (Last 24 hours) at 07/10/2020 1131 Last data filed at 07/10/2020 4650 Gross per 24 hour  Intake 293.04 ml  Output --  Net 293.04 ml   Filed Weights   07/09/20 1513  Weight: 72.5 kg    Examination:  General exam: Appears calm and comfortable  Respiratory system: Clear to auscultation.  No wheezes, no crackles, no rhonchi.  Respiratory effort normal. Cardiovascular system: S1 & S2 heard, RRR. No JVD, murmurs, rubs, gallops or clicks. No pedal edema. Gastrointestinal system: Abdomen is nondistended, soft and nontender. No organomegaly or masses felt. Normal bowel sounds heard. Central nervous system: Alert and oriented. No focal neurological deficits. Extremities: Symmetric 5 x 5 power. Skin: No rashes, lesions or ulcers Psychiatry: Judgement and insight appear normal. Mood & affect appropriate.     Data Reviewed: I have personally reviewed following labs and imaging studies  CBC: Recent Labs  Lab 07/09/20 1505 07/10/20 0725  WBC 6.9 4.5  NEUTROABS 5.2  --   HGB 10.1* 8.1*   HCT 29.7* 24.3*  MCV 101.0* 100.8*  PLT 184 137*    Basic Metabolic Panel: Recent Labs  Lab 07/09/20 1505 07/09/20 1711 07/10/20 0725  NA 134*  --  135  K 4.1  --  3.8  CL 103  --  105  CO2 22  --  23  GLUCOSE 112*  --  104*  BUN 34*  --  27*  CREATININE 1.07*  --  1.02*  CALCIUM 8.4*  --  8.0*  MG  --  1.7 2.2  PHOS  --   --  3.0    GFR: Estimated Creatinine Clearance: 34.4 mL/min (A) (by C-G formula based on SCr of 1.02 mg/dL (H)).  Liver Function Tests: Recent Labs  Lab 07/09/20 1505 07/10/20 0725  AST 34 37  ALT 14 14  ALKPHOS 47 37*  BILITOT 0.8 0.5  PROT 6.9 5.7*  ALBUMIN 3.5 2.7*    CBG: No results for input(s): GLUCAP in the last 168 hours.   Recent Results (from the past 240 hour(s))  Resp Panel by RT-PCR (Flu A&B, Covid) Nasopharyngeal Swab     Status: Abnormal   Collection Time: 07/09/20  5:11 PM   Specimen: Nasopharyngeal Swab; Nasopharyngeal(NP) swabs in vial transport medium  Result Value Ref Range Status  SARS Coronavirus 2 by RT PCR POSITIVE (A) NEGATIVE Final    Comment: RESULT CALLED TO, READ BACK BY AND VERIFIED WITH: KATYE KESLER AT 1857 ON 07/09/20 BY SS (NOTE) SARS-CoV-2 target nucleic acids are DETECTED.  The SARS-CoV-2 RNA is generally detectable in upper respiratory specimens during the acute phase of infection. Positive results are indicative of the presence of the identified virus, but do not rule out bacterial infection or co-infection with other pathogens not detected by the test. Clinical correlation with patient history and other diagnostic information is necessary to determine patient infection status. The expected result is Negative.  Fact Sheet for Patients: EntrepreneurPulse.com.au  Fact Sheet for Healthcare Providers: IncredibleEmployment.be  This test is not yet approved or cleared by the [place] FDA and  has been authorized for detection and/or diagnosis of SARS-CoV-2  by FDA under an Emergency Use Authorization (EUA).  This EUA will remain in effect (meaning this test ca n be used) for the duration of  the COVID-19 declaration under Section 564(b)(1) of the Act, 21 U.S.C. section 360bbb-3(b)(1), unless the authorization is terminated or revoked sooner.     Influenza A by PCR NEGATIVE NEGATIVE Final   Influenza B by PCR NEGATIVE NEGATIVE Final    Comment: (NOTE) The Xpert Xpress SARS-CoV-2/FLU/RSV plus assay is intended as an aid in the diagnosis of influenza from Nasopharyngeal swab specimens and should not be used as a sole basis for treatment. Nasal washings and aspirates are unacceptable for Xpert Xpress SARS-CoV-2/FLU/RSV testing.  Fact Sheet for Patients: EntrepreneurPulse.com.au  Fact Sheet for Healthcare Providers: IncredibleEmployment.be  This test is not yet approved or cleared by the [place] FDA and has been authorized for detection and/or diagnosis of SARS-CoV-2 by FDA under an Emergency Use Authorization (EUA). This EUA will remain in effect (meaning this test can be used) for the duration of the COVID-19 declaration under Section 564(b)(1) of the Act, 21 U.S.C. section 360bbb-3(b)(1), unless the authorization is terminated or revoked.  Performed at [facility], [address] [place], [place] [address]   Culture, blood (Routine X 2) w Reflex to ID Panel     Status: None (Preliminary result)   Collection Time: 07/09/20  9:01 PM   Specimen: BLOOD  Result Value Ref Range Status   Specimen Description BLOOD RIGHT ANTECUBITAL  Final   Special Requests NONE  Final   Culture   Final    NO GROWTH < 12 HOURS Performed at [facility], [address], [place],  [address]    Report Status PENDING  Incomplete  Culture, blood (Routine X 2) w Reflex to ID Panel     Status: None (Preliminary result)   Collection Time: 07/09/20  9:27 PM   Specimen: BLOOD  Result  Value Ref Range Status   Specimen Description BLOOD BLOOD RIGHT HAND  Final   Special Requests   Final    BOTTLES DRAWN AEROBIC AND ANAEROBIC Blood Culture adequate volume   Culture   Final    NO GROWTH < 12 HOURS Performed at [facility], [address] [place],  [address]    Report Status PENDING  Incomplete         Radiology Studies: DG Elbow Complete Left  Result Date: 07/09/2020 CLINICAL DATA:  Fall.  Left elbow pain. EXAM: LEFT ELBOW - COMPLETE 3+ VIEW COMPARISON:  None. FINDINGS: There is no evidence of fracture, dislocation, or joint effusion. There is no evidence of arthropathy or other focal bone abnormality. Soft  tissues are unremarkable. IMPRESSION: Negative. Electronically Signed   By: Dorise Bullion III M.D   On: 07/09/2020 17:44   CT Head Wo Contrast  Result Date: 07/09/2020 CLINICAL DATA:  [name] sometime last night. Late on the floor till this afternoon. EXAM: CT HEAD WITHOUT CONTRAST TECHNIQUE: Contiguous axial images were obtained from the base of the skull through the vertex without intravenous contrast. COMPARISON:  Head CT, 04/10/2020. FINDINGS: Brain: Choose 1 There is age appropriate ventricular and sulcal enlargement. Small area of encephalomalacia noted along the lateral right frontal lobe consistent with an old infarct, stable from the prior head CT. Bilateral white matter hypoattenuation is noted, also stable consistent with moderate chronic microvascular ischemic change. Vascular: No hyperdense vessel or unexpected calcification. Skull: Normal. Negative for fracture or focal lesion. Sinuses/Orbits: Visualized globes and orbits are unremarkable. There is dependent fluid in the right maxillary sinus, mild-to-moderate mucosal thickening lining the ethmoid air cells, mild right anterior sphenoid and inferior frontal sinus mucosal thickening. Mastoid air cells are clear. Other: None. IMPRESSION: 1. No acute intracranial abnormalities. 2. 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 3. Sinus disease as described including a right maxillary sinus air-fluid level. Electronically Signed   By: Lajean Manes M.D.   On: 07/09/2020 15:56   US Venous Img Lower Bilateral  Result Date: 07/09/2020 CLINICAL DATA:  Leg swelling, evaluate for DVT EXAM: BILATERAL LOWER EXTREMITY VENOUS DOPPLER ULTRASOUND TECHNIQUE: Gray-scale sonography with compression, as well as color and duplex ultrasound, were performed to evaluate the deep venous system(s) from the level of the common femoral vein through the popliteal and proximal calf veins. COMPARISON:  None. FINDINGS: VENOUS Normal compressibility of the common femoral, superficial femoral, and popliteal veins, as well as the visualized calf veins. Visualized portions of profunda femoral vein and great saphenous vein unremarkable. No filling defects to suggest DVT on grayscale or color Doppler imaging. Doppler waveforms show normal direction of venous flow, normal respiratory plasticity and response to augmentation. Limited views of the contralateral common femoral vein are unremarkable. OTHER None. Limitations: none IMPRESSION: Negative examination for deep venous thrombosis in the bilateral lower extremities. Electronically Signed   By: Eddie Candle M.D.   On: 07/09/2020 18:41   DG Chest Portable 1 View  Result Date: 07/09/2020 CLINICAL DATA:  Fall.  Left hip pain. EXAM: PORTABLE CHEST 1 VIEW COMPARISON:  March 13, 2019 FINDINGS: Cardiomediastinal silhouette is stable. Stable mild cardiomegaly. Stable tortuous thoracic aorta. No pneumothorax. No nodules or masses. No focal infiltrates. No acute abnormalities. IMPRESSION: No active disease. Electronically Signed   By: Dorise Bullion III M.D   On: 07/09/2020 16:21   ECHOCARDIOGRAM COMPLETE  Result Date: 07/10/2020    ECHOCARDIOGRAM REPORT   Patient Name:   JASREET DICKIE Date of Exam: 07/10/2020  Medical Rec #:  [id number]        Height:       65.0 in Accession #:    [id number]       Weight:       159.8 lb Date of Birth:  11-07-1924       BSA:          1.798 m Patient Age:    60 years         BP:           107/58 mmHg Patient Gender: F                HR:  70 bpm. Exam Location:  [facility] Procedure: 2D Echo, Cardiac Doppler and Color Doppler Indications:     Elevtated troponin  History:         Patient has no prior history of Echocardiogram examinations.                  Stroke; Risk Factors:Hypertension.  Sonographer:     Sherrie Sport RDCS (AE) Referring Phys:  [id number] Rhetta Mura Diagnosing Phys: Kathlyn Sacramento MD IMPRESSIONS  1. Left ventricular ejection fraction, by estimation, is 60 to 65%. The left ventricle has normal function. The left ventricle has no regional wall motion abnormalities. There is mild left ventricular hypertrophy. Left ventricular diastolic parameters are consistent with Grade II diastolic dysfunction (pseudonormalization).  2. Right ventricular systolic function is normal. The right ventricular size is normal. There is moderately elevated pulmonary artery systolic pressure. The estimated right ventricular systolic pressure is 22.4 mmHg.  3. Left atrial size was moderately dilated.  4. The mitral valve is normal in structure. Mild to moderate mitral valve regurgitation. No evidence of mitral stenosis. Moderate mitral annular calcification.  5. Tricuspid valve regurgitation is moderate.  6. The aortic valve is normal in structure. Aortic valve regurgitation is mild. Mild aortic valve stenosis. Aortic valve area, by VTI measures 1.87 cm. Aortic valve mean gradient measures 8.0 mmHg. FINDINGS  Left Ventricle: Left ventricular ejection fraction, by estimation, is 60 to 65%. The left ventricle has normal function. The left ventricle has no regional wall motion abnormalities. The left ventricular internal cavity size was normal in size. There is  mild left ventricular hypertrophy.  Left ventricular diastolic parameters are consistent with Grade II diastolic dysfunction (pseudonormalization). Right Ventricle: The right ventricular size is normal. No increase in right ventricular wall thickness. Right ventricular systolic function is normal. There is moderately elevated pulmonary artery systolic pressure. The tricuspid regurgitant velocity is 3.52 m/s, and with an assumed right atrial pressure of 10 mmHg, the estimated right ventricular systolic pressure is 82.5 mmHg. Left Atrium: Left atrial size was moderately dilated. Right Atrium: Right atrial size was normal in size. Pericardium: There is no evidence of pericardial effusion. Mitral Valve: The mitral valve is normal in structure. Moderate mitral annular calcification. Mild to moderate mitral valve regurgitation. No evidence of mitral valve stenosis. Tricuspid Valve: The tricuspid valve is normal in structure. Tricuspid valve regurgitation is moderate . No evidence of tricuspid stenosis. Aortic Valve: The aortic valve is normal in structure. Aortic valve regurgitation is mild. Mild aortic stenosis is present. Aortic valve mean gradient measures 8.0 mmHg. Aortic valve peak gradient measures 14.6 mmHg. Aortic valve area, by VTI measures 1.87 cm. Pulmonic Valve: The pulmonic valve was normal in structure. Pulmonic valve regurgitation is mild. No evidence of pulmonic stenosis. Aorta: The aortic root is normal in size and structure. Venous: The inferior vena cava was not well visualized. IAS/Shunts: No atrial level shunt detected by color flow Doppler.  LEFT VENTRICLE PLAX 2D LVIDd:         4.14 cm  Diastology LVIDs:         2.71 cm  LV e' medial:    5.11 cm/s LV PW:         1.18 cm  LV E/e' medial:  20.9 LV IVS:        1.23 cm  LV e' lateral:   4.79 cm/s LVOT diam:     2.00 cm  LV E/e' lateral: 22.3 LV SV:  63 LV SV Index:   35 LVOT Area:     3.14 cm  RIGHT VENTRICLE RV S prime:     13.70 cm/s TAPSE (M-mode): 3.5 cm LEFT ATRIUM             Index       RIGHT ATRIUM           Index LA diam:      3.90 cm  2.17 cm/m  RA Area:     17.30 cm LA Vol (A2C): 164.0 ml 91.19 ml/m RA Volume:   53.40 ml  29.69 ml/m LA Vol (A4C): 60.1 ml  33.42 ml/m  AORTIC VALVE                    PULMONIC VALVE AV Area (Vmax):    1.63 cm     PV Vmax:        0.74 m/s AV Area (Vmean):   1.68 cm     PV Peak grad:   2.2 mmHg AV Area (VTI):     1.87 cm     RVOT Peak grad: 3 mmHg AV Vmax:           191.33 cm/s AV Vmean:          129.000 cm/s AV VTI:            0.339 m AV Peak Grad:      14.6 mmHg AV Mean Grad:      8.0 mmHg LVOT Vmax:         99.20 cm/s LVOT Vmean:        69.100 cm/s LVOT VTI:          0.202 m LVOT/AV VTI ratio: 0.60  AORTA Ao Root diam: 3.07 cm MITRAL VALVE                TRICUSPID VALVE MV Area (PHT): 3.36 cm     TR Peak grad:   49.6 mmHg MV Decel Time: 226 msec     TR Vmax:        352.00 cm/s MV E velocity: 107.00 cm/s MV A velocity: 126.00 cm/s  SHUNTS MV E/A ratio:  0.85         Systemic VTI:  0.20 m                             Systemic Diam: 2.00 cm Kathlyn Sacramento MD Electronically signed by Kathlyn Sacramento MD Signature Date/Time: 07/10/2020/11:21:50 AM    Final    DG Hip Unilat W or Wo Pelvis 2-3 Views Left  Result Date: 07/09/2020 CLINICAL DATA:  Pain after fall EXAM: DG HIP (WITH OR WITHOUT PELVIS) 2-3V LEFT COMPARISON:  None. FINDINGS: Mild irregularity along the lateral aspect of the left femoral head is likely a small osteophyte. No fracture or dislocation. Severe degenerative changes in the right hip with near complete loss of joint space and bony sclerosis. No other acute abnormalities. IMPRESSION: No acute fractures. Electronically Signed   By: Dorise Bullion III M.D   On: 07/09/2020 16:20        Scheduled Meds:  anastrozole  1 mg Oral Daily   vitamin C  500 mg Oral Daily   aspirin EC  81 mg Oral Daily   loratadine  10 mg Oral Daily   metoprolol tartrate  25 mg Oral Daily   nystatin   Topical BID   pantoprazole  40 mg Oral Q0600  PARoxetine  30 mg Oral Daily   zinc sulfate  220 mg Oral Daily   Continuous Infusions:  cefTRIAXone (ROCEPHIN)  IV     remdesivir 100 mg in NS 100 mL 100 mg (07/10/20 1003)     LOS: 1 day    Time spent: 40 mins    Irine Seal, MD [facility]   To contact the attending provider between 7A-7P or the covering provider during after hours 7P-7A, please log into the web site [email or web address] and access using universal [place] password for that web site. If you do not have the password, please call the hospital operator.  07/10/2020, 11:31 AM

## 2020-07-10 NOTE — Evaluation (Signed)
Occupational Therapy Evaluation Patient Details Name: [name] MRN: [id number] DOB: 10/06/1924 Today's Date: 07/10/2020    History of Present Illness Pt is a 87 y/o F admitted on 07/09/20 with c/c of generalized weakness. Pt being treated for acute cystitis. Pt found to be COVID (+). Pt endorses 2 falls 2/2 tripping over the last 10 days. PMH: HTN, R knee arthritis, carotid artery occlusion, depression, HLD, mini stroke (July 2005)   Clinical Impression   [name] was seen for OT evaluation this date. Prior to hospital admission, pt was MOD I for mobility and ADLs, meals from family. Pt lives alone with family available PRN. Pt presents to acute OT demonstrating impaired ADL performance and functional mobility 2/2 decreased activity tolerance, functional strength/ROM/balance deficits, and poor safety awareness. Pt currently requires MAX A for ADL t/f. SETUP bed level grooming, anticipate CGA + single UE support seated grooming tasks. MAX A for LBD at bed level. Pt would benefit from skilled OT to address noted impairments and functional limitations (see below for any additional details) in order to maximize safety and independence while minimizing falls risk and caregiver burden. Upon hospital discharge, recommend STR to maximize pt safety and return to PLOF.     Follow Up Recommendations  SNF    Equipment Recommendations  3 in 1 bedside commode    Recommendations for Other Services       Precautions / Restrictions Precautions Precautions: Fall Restrictions Weight Bearing Restrictions: No      Mobility Bed Mobility Overal bed mobility: Needs Assistance Bed Mobility: Sit to Supine       Sit to supine: Mod assist        Transfers Overall transfer level: Needs assistance Equipment used: None Transfers: Sit to/from [employer] Transfers Sit to Stand: Mod assist Stand pivot transfers: Max assist       General transfer comment: MOD A from recliner with B arm  rests    Balance Overall balance assessment: Needs assistance Sitting-balance support: Feet supported;Single extremity supported Sitting balance-Leahy Scale: Fair     Standing balance support: No upper extremity supported Standing balance-Leahy Scale: Zero                             ADL either performed or assessed with clinical judgement   ADL Overall ADL's : Needs assistance/impaired                                       General ADL Comments: MAX A for ADL t/f. SETUP bed level grooming, anticipate CGA + single UE support seated grooming tasks. MAX A for LBD at bed level      Pertinent Vitals/Pain Pain Assessment: No/denies pain     Hand Dominance     Extremity/Trunk Assessment Upper Extremity Assessment Upper Extremity Assessment: Generalized weakness   Lower Extremity Assessment Lower Extremity Assessment: Generalized weakness       Communication Communication Communication: No difficulties   Cognition Arousal/Alertness: Awake/alert Behavior During Therapy: WFL for tasks assessed/performed Overall Cognitive Status: No family/caregiver present to determine baseline cognitive functioning                                 General Comments: pt asked to call family member, upon handed phone pt states "I thought you called me"  General Comments       Exercises Exercises: Other exercises Other Exercises Other Exercises: Pt educated re: OT role, DME recs, d/c recs, falls prevention, ECS Other Exercises: LBD, sit>sup, sitting/standing balance/tolerance, SPT   Shoulder Instructions      Home Living Family/patient expects to be discharged to:: Private residence Living Arrangements: Alone Available Help at Discharge: Family;Available PRN/intermittently Type of Home: House Home Access: Stairs to enter [employer] of Steps: 2-4 Entrance Stairs-Rails: Right Home Layout: One level               Home  Equipment: Walker - 4 wheels;Wheelchair - manual          Prior Functioning/Environment Level of Independence: Independent with assistive device(s);Needs assistance        Comments: mod I with rollator in the home, bathes & dresses without assistance, son brings groceries to her, daughter takes her to appoinments (pt uses w/c for appointments). Pt does endorse several falls in the past 6 months.        OT Problem List: Decreased strength;Decreased range of motion;Decreased activity tolerance;Impaired balance (sitting and/or standing);Decreased cognition;Decreased safety awareness      OT Treatment/Interventions: Self-care/ADL training;Therapeutic exercise;Energy conservation;DME and/or AE instruction;Therapeutic activities;Patient/family education;Balance training    OT Goals(Current goals can be found in the care plan section) Acute Rehab OT Goals Patient Stated Goal: feel better OT Goal Formulation: With patient Time For Goal Achievement: 07/24/20 Potential to Achieve Goals: Good ADL Goals Pt Will Perform Grooming: with modified independence;sitting Pt Will Perform Lower Body Dressing: sitting/lateral leans;with set-up;with supervision Pt Will Transfer to Toilet: with min assist;stand pivot transfer;bedside commode (c LRAD PRN)  OT Frequency: Min 1X/week   Barriers to D/C: Decreased caregiver support          Co-evaluation              AM-PAC OT "6 Clicks" Daily Activity     Outcome Measure Help from another person eating meals?: None Help from another person taking care of personal grooming?: A Little Help from another person toileting, which includes using toliet, bedpan, or urinal?: A Lot Help from another person bathing (including washing, rinsing, drying)?: A Lot Help from another person to put on and taking off regular upper body clothing?: A Little Help from another person to put on and taking off regular lower body clothing?: A Lot 6 Click Score: 16    End of Session    Activity Tolerance: Patient tolerated treatment well Patient left: in bed;with call bell/phone within reach;with bed alarm set  OT Visit Diagnosis: Unsteadiness on feet (R26.81);Muscle weakness (generalized) (M62.81)                Time: 5110-2111 OT Time Calculation (min): 17 min Charges:  OT General Charges $OT Visit: 1 Visit OT Evaluation $OT Eval Low Complexity: 1 Low OT Treatments $Self Care/Home Management : 8-22 mins  Dessie Coma, M.S. OTR/L  07/10/20, 5:18 PM  ascom [phone]

## 2020-07-10 NOTE — Evaluation (Signed)
Physical Therapy Evaluation Patient Details Name: [name] MRN: [id number] DOB: [birth year] Today's Date: 07/10/2020   History of Present Illness  Pt is a 87 y/o F admitted on 07/09/20 with c/c of generalized weakness. Pt being treated for acute cystitis. Pt found to be COVID (+). Pt endorses 2 falls 2/2 tripping over the last 10 days. PMH: HTN, R knee arthritis, carotid artery occlusion, depression, HLD, mini stroke (July 2005)  Clinical Impression  Pt seen for PT evaluation with pt reporting she is ambulatory with rollator in her home with children assisting her with transportation & providing her with groceries; pt does endorse frequent falls prior to admission. Pt requires max assist for bed mobility on this date, as well as max assist for sit>stand & taking a few steps to pivot to the recliner with a RW. Pt demonstrates impaired sitting & standing balance with posterior lean. Pt performs BLE LAQ but then rests. Pt endorses not feeling well & appears as though she feels bad. Pt set up with lunch tray & appreciative of PT visit. Pt would benefit from STR upon d/c to maximize independence with functional mobility & reduce fall risk prior to return home.     Follow Up Recommendations SNF    Equipment Recommendations   (TBD in next venue)    Recommendations for Other Services       Precautions / Restrictions Precautions Precautions: Fall Restrictions Weight Bearing Restrictions: No      Mobility  Bed Mobility Overal bed mobility: Needs Assistance Bed Mobility: Supine to Sit     Supine to sit: Max assist;HOB elevated     General bed mobility comments: max assist to upright trunk to come to sitting EOB    Transfers Overall transfer level: Needs assistance Equipment used: Rolling walker (2 wheeled) Transfers: Sit to/from [employer] Sit to Stand: Max assist;From elevated surface Stand pivot transfers: From elevated surface;Max assist       General  transfer comment: poor demo of safe hand placement as pt initially attempts to pull to standing with BUE on RW, PT provides cuing to push to standing  Ambulation/Gait Ambulation/Gait assistance: Max Web designer (Feet): 1 Feet Assistive device: Rolling walker (2 wheeled) Gait Pattern/deviations: Decreased step length - right;Decreased step length - left;Staggering left;Staggering right;Decreased stride length Gait velocity: decreased   General Gait Details: stand pivot stepping bed>recliner  Stairs            Wheelchair Mobility    Modified Rankin (Stroke Patients Only)       Balance Overall balance assessment: Needs assistance Sitting-balance support: Feet supported;Bilateral upper extremity supported Sitting balance-Leahy Scale: Fair       Standing balance-Leahy Scale: Zero Standing balance comment: BUE on RW, max assist for stand pivot transfer                             Pertinent Vitals/Pain Pain Assessment:  (pt denies pain but has general c/o of not feeling well & looks as though she feels bad)    Home Living Family/patient expects to be discharged to:: Private residence Living Arrangements: Alone Available Help at Discharge: Family;Available PRN/intermittently Type of Home: House Home Access: Stairs to enter Entrance Stairs-Rails: Right Entrance Stairs-Number of Steps: 2-4 Home Layout: One level Home Equipment: Walker - 4 wheels;Wheelchair - manual      Prior Function Level of Independence: Independent with assistive device(s);Needs assistance  Comments: mod I with rollator in the home, bathes & dresses without assistance, son brings groceries to her, daughter takes her to appoinments (pt uses w/c for appointments). Pt does endorse several falls in the past 6 months.     Hand Dominance        Extremity/Trunk Assessment   Upper Extremity Assessment Upper Extremity Assessment: Generalized weakness    Lower  Extremity Assessment Lower Extremity Assessment: Generalized weakness       Communication   Communication: No difficulties  Cognition Arousal/Alertness: Awake/alert Behavior During Therapy: WFL for tasks assessed/performed Overall Cognitive Status: Within Functional Limits for tasks assessed                                        General Comments General comments (skin integrity, edema, etc.): Pt on 0.5L via nasal cannula, SpO2 >90%, HR 86 bpm    Exercises General Exercises - Lower Extremity Long Arc Quad: AROM;Strengthening;Both;10 reps;Seated   Assessment/Plan    PT Assessment Patient needs continued PT services  PT Problem List Decreased strength;Decreased mobility;Decreased activity tolerance;Decreased balance;Decreased knowledge of use of DME;Cardiopulmonary status limiting activity       PT Treatment Interventions DME instruction;Therapeutic exercise;Gait training;Balance training;Therapeutic activities;Patient/family education;Functional mobility training;Stair training;Neuromuscular re-education;Modalities    PT Goals (Current goals can be found in the Care Plan section)  Acute Rehab PT Goals Patient Stated Goal: feel better PT Goal Formulation: With patient Time For Goal Achievement: 07/24/20 Potential to Achieve Goals: Good    Frequency Min 2X/week   Barriers to discharge Decreased caregiver support;Inaccessible home environment lives alone, 2-4 STE home    Co-evaluation               AM-PAC PT "6 Clicks" Mobility  Outcome Measure Help needed turning from your back to your side while in a flat bed without using bedrails?: A Lot Help needed moving from lying on your back to sitting on the side of a flat bed without using bedrails?: Total Help needed moving to and from a bed to a chair (including a wheelchair)?: Total Help needed standing up from a chair using your arms (e.g., wheelchair or bedside chair)?: Total Help needed to walk in  hospital room?: Total Help needed climbing 3-5 steps with a railing? : Total 6 Click Score: 7    End of Session Equipment Utilized During Treatment: Gait belt Activity Tolerance: Patient limited by fatigue Patient left: in chair;with chair alarm set;with call bell/phone within reach (set up with meal tray) Nurse Communication: Mobility status PT Visit Diagnosis: Unsteadiness on feet (R26.81);Difficulty in walking, not elsewhere classified (R26.2);Muscle weakness (generalized) (M62.81)    Time: 1140-1200 PT Time Calculation (min) (ACUTE ONLY): 20 min   Charges:   PT Evaluation $PT Eval Moderate Complexity: [place], PT, DPT 07/10/20, 1:01 PM   Waunita Schooner 07/10/2020, 12:59 PM

## 2020-07-11 DIAGNOSIS — B9620 Unspecified Escherichia coli [E. coli] as the cause of diseases classified elsewhere: Secondary | ICD-10-CM

## 2020-07-11 DIAGNOSIS — N390 Urinary tract infection, site not specified: Secondary | ICD-10-CM

## 2020-07-11 LAB — CBC WITH DIFFERENTIAL/PLATELET
Abs Immature Granulocytes: 0.02 10*3/uL (ref 0.00–0.07)
Basophils Absolute: 0 10*3/uL (ref 0.0–0.1)
Basophils Relative: 0 %
Eosinophils Absolute: 0.2 10*3/uL (ref 0.0–0.5)
Eosinophils Relative: 3 %
HCT: 23.7 % — ABNORMAL LOW (ref 36.0–46.0)
Hemoglobin: 8.1 g/dL — ABNORMAL LOW (ref 12.0–15.0)
Immature Granulocytes: 0 %
Lymphocytes Relative: 42 %
Lymphs Abs: 2.4 10*3/uL (ref 0.7–4.0)
MCH: 34.5 pg — ABNORMAL HIGH (ref 26.0–34.0)
MCHC: 34.2 g/dL (ref 30.0–36.0)
MCV: 100.9 fL — ABNORMAL HIGH (ref 80.0–100.0)
Monocytes Absolute: 0.4 10*3/uL (ref 0.1–1.0)
Monocytes Relative: 8 %
Neutro Abs: 2.6 10*3/uL (ref 1.7–7.7)
Neutrophils Relative %: 47 %
Platelets: 137 10*3/uL — ABNORMAL LOW (ref 150–400)
RBC: 2.35 MIL/uL — ABNORMAL LOW (ref 3.87–5.11)
RDW: 11.9 % (ref 11.5–15.5)
WBC: 5.6 10*3/uL (ref 4.0–10.5)
nRBC: 0 % (ref 0.0–0.2)

## 2020-07-11 LAB — COMPREHENSIVE METABOLIC PANEL
ALT: 16 U/L (ref 0–44)
AST: 35 U/L (ref 15–41)
Albumin: 2.7 g/dL — ABNORMAL LOW (ref 3.5–5.0)
Alkaline Phosphatase: 44 U/L (ref 38–126)
Anion gap: 7 (ref 5–15)
BUN: 25 mg/dL — ABNORMAL HIGH (ref 8–23)
CO2: 24 mmol/L (ref 22–32)
Calcium: 8.1 mg/dL — ABNORMAL LOW (ref 8.9–10.3)
Chloride: 105 mmol/L (ref 98–111)
Creatinine, Ser: 0.82 mg/dL (ref 0.44–1.00)
GFR, Estimated: >60 mL/min (ref >60)
Glucose, Bld: 103 mg/dL — ABNORMAL HIGH (ref 70–99)
Potassium: 3.8 mmol/L (ref 3.5–5.1)
Sodium: 136 mmol/L (ref 135–145)
Total Bilirubin: 0.5 mg/dL (ref 0.3–1.2)
Total Protein: 5.7 g/dL — ABNORMAL LOW (ref 6.5–8.1)

## 2020-07-11 LAB — FOLATE: Folate: 12.6 ng/mL (ref >5.9)

## 2020-07-11 LAB — PHOSPHORUS: Phosphorus: 2.7 mg/dL (ref 2.5–4.6)

## 2020-07-11 LAB — VITAMIN B12: Vitamin B-12: 1149 pg/mL — ABNORMAL HIGH (ref 180–914)

## 2020-07-11 LAB — IRON AND TIBC
Iron: 64 ug/dL (ref 28–170)
Saturation Ratios: 25 % (ref 10.4–31.8)
TIBC: 252 ug/dL (ref 250–450)
UIBC: 188 ug/dL

## 2020-07-11 LAB — D-DIMER, QUANTITATIVE: D-Dimer, Quant: 1.77 ug/mL-FEU — ABNORMAL HIGH (ref 0.00–0.50)

## 2020-07-11 LAB — MAGNESIUM: Magnesium: 1.8 mg/dL (ref 1.7–2.4)

## 2020-07-11 LAB — C-REACTIVE PROTEIN: CRP: 6.6 mg/dL — ABNORMAL HIGH (ref <1.0)

## 2020-07-11 LAB — FERRITIN: Ferritin: 119 ng/mL (ref 11–307)

## 2020-07-11 MED ORDER — HYDRALAZINE HCL 20 MG/ML IJ SOLN
5.0000 mg | Freq: Four times a day (QID) | INTRAMUSCULAR | Status: DC | PRN
  Administered 2020-07-14: 5 mg via INTRAVENOUS
  Filled 2020-07-11: qty 1

## 2020-07-11 MED ORDER — SODIUM CHLORIDE 0.9 % IV SOLN: INTRAVENOUS | Status: DC

## 2020-07-11 MED ORDER — ENOXAPARIN SODIUM 40 MG/0.4ML IJ SOSY
40.0000 mg | PREFILLED_SYRINGE | INTRAMUSCULAR | Status: DC
  Administered 2020-07-11 – 2020-07-16 (×6): 40 mg via SUBCUTANEOUS
  Filled 2020-07-11 (×7): qty 0.4

## 2020-07-11 NOTE — TOC Progression Note (Signed)
Transition of Care [facility]) - Progression Note    Patient Details  Name: [name] MRN: [id number] Date of Birth: Jul 18, 1924  Transition of Care [facility]) CM/SW [place], RN Phone Number: 07/11/2020, 1:59 PM  Clinical Narrative: Spoke with Dr. Grandville Silos about discharge arrangements for patient, he says that the family prefers [facility], and if they are not able to take her due to +COVID 07/09/20 until 10 days we will not discharge home due to patient living alone, we will keep her in the hospital until she's able to be admitted. I agree to call and inquire about COVID admissions policy. I did call Ritta Slot, no answer left voice message to return call.      Expected Discharge Plan: [place] Barriers to Discharge: Continued Medical Work up  Expected Discharge Plan and Services Expected Discharge Plan: [place]   Discharge Planning Services: CM Consult   Living arrangements for the past 2 months: Single Family Home                                       Social Determinants of Health (SDOH) Interventions    Readmission Risk Interventions No flowsheet data found.

## 2020-07-11 NOTE — TOC Initial Note (Signed)
Transition of Care [facility]) - Initial/Assessment Note    Patient Details  Name: [name] MRN: [id number] Date of Birth: Jul 29, 1924  Transition of Care [facility]) CM/SW Contact:    Beverly Sessions, RN Phone Number: 07/11/2020, 9:06 AM  Clinical Narrative:                 Patient admitted from home with cystitis and fall Due to covid isolation spoke with patient via phone Patient states that she lives at home alone  PCP [place]. - Daughter lives locally and takes her to all appointments.  Denies issues obtaining medications  Patient has rollator, cane, and transport chair in the home.   PT has recommended SNF.  Patient requested that I speak to her daughter about SNF placement  Daughter states that she is agreeable to SNF.  But "only wants the best"  Her preference is [facility] or [facility] in [place].   Due to patient testing covid positive on 6/26 I informed daughter that it would be a barrier getting her mother to a SNF prior to a 10 day covid isolation.    Daughter agreeable to bed search to see if there are any facilities that can accept patient prior to 10 day covid isolation.   I called [facility] and [facility] in [place] and neither can consider patient prior to 10day isolation. They are in network with patients insurance   Expected Discharge Plan: [place] Barriers to Discharge: Continued Medical Work up   Patient Goals and CMS Choice        Expected Discharge Plan and Services Expected Discharge Plan: [place]   Discharge Planning Services: CM Consult   Living arrangements for the past 2 months: Single Family Home                                      Prior Living Arrangements/Services Living arrangements for the past 2 months: Single Family Home Lives with:: Self Patient language and need for interpreter reviewed:: Yes Do you feel safe going back to the place where you live?: Yes       Need for Family Participation in Patient Care: Yes (Comment) Care giver support system in place?: Yes (comment) Current home services: DME Criminal Activity/Legal Involvement Pertinent to Current Situation/Hospitalization: No - Comment as needed  Activities of Daily Living Home Assistive Devices/Equipment: Shower chair with back, Environmental consultant (specify type) ADL Screening (condition at time of admission) Patient's cognitive ability adequate to safely complete daily activities?: Yes Is the patient deaf or have difficulty hearing?: Yes Does the patient have difficulty seeing, even when wearing glasses/contacts?: Yes Does the patient have difficulty concentrating, remembering, or making decisions?: No Patient able to express need for assistance with ADLs?: Yes Does the patient have difficulty dressing or bathing?: No Independently performs ADLs?: Yes (appropriate for developmental age) Does the patient have difficulty walking or climbing stairs?: Yes Weakness of Legs: Both Weakness of Arms/Hands: None  Permission Sought/Granted                  Emotional Assessment       Orientation: : Oriented to Self, Oriented to Place, Oriented to  Time, Oriented to Situation Alcohol / Substance Use: Not Applicable Psych Involvement: No (comment)  Admission diagnosis:  Acute cystitis [N30.00] Weakness [R53.1] Acute cystitis without hematuria [N30.00] Frequent falls [R29.6] Patient Active Problem List   Diagnosis  Date Noted   Generalized weakness 07/10/2020   Fall at home, initial encounter 07/10/2020   COVID-19 virus infection 07/10/2020   Lower extremity edema 07/10/2020   Elevated troponin 07/10/2020   Hypertension    Anemia    Acute cystitis 07/09/2020   Stage 3 chronic kidney disease ([place]) 04/22/2020   Prediabetes 04/22/2020   Personal history of transient ischemic attack (TIA), and cerebral infarction without residual deficits 04/22/2020   Osteoarthritis 04/22/2020   Mixed  hyperlipidemia 04/22/2020   Malnutrition ([place]) 04/22/2020   Major depression, single episode 04/22/2020   Edema 04/22/2020   Disorder of arteries and arterioles, unspecified ([place]) 04/22/2020   Dementia ([place]) 04/22/2020   Genetic testing 11/13/2018   Malignant neoplasm of central portion of left breast in female, estrogen receptor positive ([place]) 10/26/2018   Osteopenia of lower leg 10/26/2018   Neck pain 05/05/2015   Occlusion and stenosis of carotid artery without mention of cerebral infarction 05/27/2012   PCP:  Kelton Pillar, MD Pharmacy:   [place], [place] - [address] [place] [address] Phone: [phone] Fax: [place], [place] - [address] [place] [address] Phone: [phone] Fax: [phone]     Social Determinants of Health ([place]) Interventions    Readmission Risk Interventions No flowsheet data found.

## 2020-07-11 NOTE — Progress Notes (Addendum)
PROGRESS NOTE    [name]  NOM:[id number] DOB: 1924/02/04 DOA: 07/09/2020 PCP: Kelton Pillar, MD    Chief Complaint  Patient presents with   Fall    Brief Narrative:  Patient 87 year old female history of hypertension presented to ED with acute cystitis, generalized weakness which has been progressive over the past 2 weeks with no focal deficits.  Patient does endorse urinary symptoms.  Patient also endorses rhinitis, rhinorrhea, new onset nonproductive cough over the last 5 to 7 days without any associated shortness of breath, orthopnea or PND.  Patient also with complaints of mechanical falls as a result of tripping over the course of the past 10 days with recent episode of occurring the day prior to admission.  Patient denied any syncopal episodes.  Patient seen in the ED high-sensitivity troponin elevated however patient asymptomatic.  COVID-19 PCR was positive.  Urinalysis consistent with UTI.  Chest x-ray unremarkable.  Patient placed empirically on IV antibiotics, given the fluid bolus, placed on 3-day course of IV remdesivir as patient met criteria for high risk for complicated clinical course of COVID-19 infection and increased probability for progression for severe disease.   Assessment & Plan:   Principal Problem:   Acute cystitis Active Problems:   Generalized weakness   Fall at home, initial encounter   COVID-19 virus infection   Lower extremity edema   Elevated troponin   Hypertension   Anemia   #1 acute cystitis/E. coli UTI -Patient presented with a 7-day history of new onset dysuria, urinary urgency, generalized weakness. -Urinalysis consistent with UTI. -Urine cultures with > 100,000 E Coli with sensitivities pending. -Patient with no flank tenderness. -Blood cultures pending with no growth to date. -Continue IV Rocephin. -Follow.  2.  Generalized weakness/falls -Likely multifactorial secondary to UTI, positive COVID-19 infection. -Slowly improving  clinically. -Patient pancultured results pending -Urine cultures with > 100,000 colonies of E. coli with sensitivities pending. -Continue IV Rocephin. -Continue IV remdesivir. -PT/OT.  3.  COVID-19 infection -COVID-19 PCR positive. -Patient noted to have a 5 to 6-day history of new onset rhinitis, rhinorrhea, nonproductive cough. -Patient with sats noted greater 94% on room air. -Patient currently on 1.5 L nasal cannula with sats of 96%. -Hold off on steroids. -Due to patient's age and high risk for progression to severe COVID-19 infection patient placed on IV remdesivir x3 days as evidence noted and initiation of remdesivir early in the course of infection can decrease likelihood of progression of severity of illness. -Monitor respiratory status closely and if worsening may consider starting patient empirically on IV Solu-Medrol. -D-dimer noted to be elevated and as CT angiogram chest was negative for PE however did show bibasilar atelectasis. -Follow CRP, LDH, D-dimer. -Continue vitamin C, zinc, Combivent.   -Pulmonary toilet.   -Supportive care.   -Continue IV remdesivir.   -Pulmonary toilet. -Supportive care.  4.  Anemia -Patient with no overt bleeding. -Anemia panel with iron level of 64, TIBC of 252, folate of 12.6, vitamin B12 of 1149.   -Hemoglobin seems to stabilize at 8.1.   -Follow H&H.  -Transfusion threshold hemoglobin < 7.  5.  Lower extremity edema -Patient with new onset lower extremity edema R > L. -Edema may be secondary to albuminemia. -Lower extremity Dopplers negative for DVT. -2D echo with EF of 60 to 65%, NWMA, mild LVH, grade 2 diastolic dysfunction, mild to moderate MVR, moderate TVR.   -Patient with no history of liver disease or recent travel. -No history of nephrotic syndrome -May benefit  from a dose of IV albumin we will follow for now. -Strict I's and O's -Daily weights.  6.  Elevated troponin -Patient mildly elevated troponin likely secondary  to demand ischemia. -Patient asymptomatic, denies any chest pain or significant shortness of breath.   -EKG with nonspecific T wave inversion in aVL otherwise no changes of ischemia noted. -ACS less likely. -2D echo with EF of 60 to 65%, NWMA, mild LVH, grade 2 diastolic dysfunction, mild to moderate MVR, moderate TVR.   -No further cardiac work-up needed at this time.  7.  Hypertension -Continue to hold HCTZ.   -Lopressor.  8.  History of breast cancer in remission -Continue Arimidex.   DVT prophylaxis: Lovenox Code Status: DNR Family Communication: Updated patient. Updated daughter Philomena Doheny via telephone.  Disposition:   Status is: Inpatient  Remains inpatient appropriate because:Inpatient level of care appropriate due to severity of illness  Dispo: The patient is from: Home              Anticipated d/c is to: SNF.  Patient lives alone and unsafe to be discharged home.  Will likely go to SNF once 10-day quarantine completed and okay with SNF.              Patient currently is not medically stable to d/c.   Difficult to place patient No       Consultants:  None  Procedures:  CT head 07/09/2020 CT angiogram chest 07/10/2020 Plain films of the elbow 07/09/2020 Chest x-ray 07/09/2020 Plain films of the left hip and pelvis 07/09/2020 Lower extremity Doppler 07/09/2020 2D echo 07/10/2020  Antimicrobials:  IV Rocephin 07/09/2020>>>>   Subjective: Patient more alert.  States overall feeling better.  Feels weakness is improving.  Denies any dysuria.  No chest pain.  No shortness of breath.  No abdominal pain.  Asking how she may have gotten COVID-19 infection.    Objective: Vitals:   07/11/20 0022 07/11/20 0406 07/11/20 0600 07/11/20 0808  BP: (!) 153/67 129/66  114/64  Pulse: 70 65  67  Resp: (!) 24 20    Temp: 98.7 F (37.1 C) 98.3 F (36.8 C)  98.5 F (36.9 C)  TempSrc: Oral Oral  Oral  SpO2: 94% 96%  96%  Weight:   71.5 kg   Height:        Intake/Output  Summary (Last 24 hours) at 07/11/2020 1109 Last data filed at 07/11/2020 0545 Gross per 24 hour  Intake 200 ml  Output 500 ml  Net -300 ml    Filed Weights   07/09/20 1513 07/11/20 0600  Weight: 72.5 kg 71.5 kg    Examination:  General exam: NAD. Respiratory system: CTA B.  No wheezes, no crackles, no rhonchi.  Normal respiratory effort.  Cardiovascular system: Regular rate and rhythm no murmurs rubs or gallops.  No JVD.  Trace to 1+ bilateral lower extremity edema. Gastrointestinal system: Abdomen soft, nontender, nondistended, positive bowel sounds.  No rebound.  No guarding.  Central nervous system: Alert and oriented. No focal neurological deficits. Extremities: Symmetric 5 x 5 power.  Trace to 1+ bilateral lower extremity edema. Skin: No rashes, lesions or ulcers Psychiatry: Judgement and insight appear normal. Mood & affect appropriate.     Data Reviewed: I have personally reviewed following labs and imaging studies  CBC: Recent Labs  Lab 07/09/20 1505 07/10/20 0725 07/11/20 0502  WBC 6.9 4.5 5.6  NEUTROABS 5.2  --  2.6  HGB 10.1* 8.1* 8.1*  HCT 29.7* 24.3* 23.7*  MCV 101.0* 100.8* 100.9*  PLT 184 137* 137*     Basic Metabolic Panel: Recent Labs  Lab 07/09/20 1505 07/09/20 1711 07/10/20 0725 07/11/20 0502  NA 134*  --  135 136  K 4.1  --  3.8 3.8  CL 103  --  105 105  CO2 22  --  23 24  GLUCOSE 112*  --  104* 103*  BUN 34*  --  27* 25*  CREATININE 1.07*  --  1.02* 0.82  CALCIUM 8.4*  --  8.0* 8.1*  MG  --  1.7 2.2 1.8  PHOS  --   --  3.0 2.7     GFR: Estimated Creatinine Clearance: 42.5 mL/min (by C-G formula based on SCr of 0.82 mg/dL).  Liver Function Tests: Recent Labs  Lab 07/09/20 1505 07/10/20 0725 07/11/20 0502  AST 34 37 35  ALT _0 ALKPHOS 47 37* 44  BILITOT 0.8 0.5 0.5  PROT 6.9 5.7* 5.7*  ALBUMIN 3.5 2.7* 2.7*     CBG: No results for input(s): GLUCAP in the last 168 hours.   Recent Results (from the past 240  hour(s))  Resp Panel by RT-PCR (Flu A&B, Covid) Nasopharyngeal Swab     Status: Abnormal   Collection Time: 07/09/20  5:11 PM   Specimen: Nasopharyngeal Swab; Nasopharyngeal(NP) swabs in vial transport medium  Result Value Ref Range Status   SARS Coronavirus 2 by RT PCR POSITIVE (A) NEGATIVE Final    Comment: RESULT CALLED TO, READ BACK BY AND VERIFIED WITH: KATYE KESLER AT 1857 ON 07/09/20 BY SS (NOTE) SARS-CoV-2 target nucleic acids are DETECTED.  The SARS-CoV-2 RNA is generally detectable in upper respiratory specimens during the acute phase of infection. Positive results are indicative of the presence of the identified virus, but do not rule out bacterial infection or co-infection with other pathogens not detected by the test. Clinical correlation with patient history and other diagnostic information is necessary to determine patient infection status. The expected result is Negative.  Fact Sheet for Patients: EntrepreneurPulse.com.au  Fact Sheet for Healthcare Providers: IncredibleEmployment.be  This test is not yet approved or cleared by the [place] FDA and  has been authorized for detection and/or diagnosis of SARS-CoV-2 by FDA under an Emergency Use Authorization (EUA).  This EUA will remain in effect (meaning this test ca n be used) for the duration of  the COVID-19 declaration under Section 564(b)(1) of the Act, 21 U.S.C. section 360bbb-3(b)(1), unless the authorization is terminated or revoked sooner.     Influenza A by PCR NEGATIVE NEGATIVE Final   Influenza B by PCR NEGATIVE NEGATIVE Final    Comment: (NOTE) The Xpert Xpress SARS-CoV-2/FLU/RSV plus assay is intended as an aid in the diagnosis of influenza from Nasopharyngeal swab specimens and should not be used as a sole basis for treatment. Nasal washings and aspirates are unacceptable for Xpert Xpress SARS-CoV-2/FLU/RSV testing.  Fact Sheet for  Patients: EntrepreneurPulse.com.au  Fact Sheet for Healthcare Providers: IncredibleEmployment.be  This test is not yet approved or cleared by the [place] FDA and has been authorized for detection and/or diagnosis of SARS-CoV-2 by FDA under an Emergency Use Authorization (EUA). This EUA will remain in effect (meaning this test can be used) for the duration of the COVID-19 declaration under Section 564(b)(1) of the Act, 21 U.S.C. section 360bbb-3(b)(1), unless the authorization is terminated or revoked.  Performed at [facility], [address] [place], [place] [address]   Urine Culture  Status: Abnormal (Preliminary result)   Collection Time: 07/09/20  5:11 PM   Specimen: Urine, Random  Result Value Ref Range Status   Specimen Description   Final    URINE, RANDOM Performed at [facility], [address], [place], [place] [address]    Special Requests   Final    NONE Performed at [facility], [address] [place], [place] [address]    Culture (A)  Final    >=100,000 COLONIES/mL ESCHERICHIA COLI SUSCEPTIBILITIES TO FOLLOW Performed at [facility], [place] [address] [place], [place] [address]    Report Status PENDING  Incomplete  Culture, blood (Routine X 2) w Reflex to ID Panel     Status: None (Preliminary result)   Collection Time: 07/09/20  9:01 PM   Specimen: BLOOD  Result Value Ref Range Status   Specimen Description BLOOD RIGHT ANTECUBITAL  Final   Special Requests NONE  Final   Culture   Final    NO GROWTH 2 DAYS Performed at [facility], [address], [place], [place] [address]    Report Status PENDING  Incomplete  Culture, blood (Routine X 2) w Reflex to ID Panel     Status: None (Preliminary result)   Collection Time: 07/09/20  9:27 PM   Specimen: BLOOD  Result Value Ref Range Status   Specimen Description BLOOD BLOOD RIGHT HAND  Final   Special Requests   Final     BOTTLES DRAWN AEROBIC AND ANAEROBIC Blood Culture adequate volume   Culture   Final    NO GROWTH 2 DAYS Performed at [facility], [address] [place], [place] [address]    Report Status PENDING  Incomplete          Radiology Studies: DG Elbow Complete Left  Result Date: 07/09/2020 CLINICAL DATA:  Fall.  Left elbow pain. EXAM: LEFT ELBOW - COMPLETE 3+ VIEW COMPARISON:  None. FINDINGS: There is no evidence of fracture, dislocation, or joint effusion. There is no evidence of arthropathy or other focal bone abnormality. Soft tissues are unremarkable. IMPRESSION: Negative. Electronically Signed   By: Dorise Bullion III M.D   On: 07/09/2020 17:44   CT Head Wo Contrast  Result Date: 07/09/2020 CLINICAL DATA:  [name] sometime last night. Late on the floor till this afternoon. EXAM: CT HEAD WITHOUT CONTRAST TECHNIQUE: Contiguous axial images were obtained from the base of the skull through the vertex without intravenous contrast. COMPARISON:  Head CT, 04/10/2020. FINDINGS: Brain: Choose 1 There is age appropriate ventricular and sulcal enlargement. Small area of encephalomalacia noted along the lateral right frontal lobe consistent with an old infarct, stable from the prior head CT. Bilateral white matter hypoattenuation is noted, also stable consistent with moderate chronic microvascular ischemic change. Vascular: No hyperdense vessel or unexpected calcification. Skull: Normal. Negative for fracture or focal lesion. Sinuses/Orbits: Visualized globes and orbits are unremarkable. There is dependent fluid in the right maxillary sinus, mild-to-moderate mucosal thickening lining the ethmoid air cells, mild right anterior sphenoid and inferior frontal sinus mucosal thickening. Mastoid air cells are clear. Other: None. IMPRESSION: 1. No acute intracranial abnormalities. 2. 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 3. Sinus  disease as described including a right maxillary sinus air-fluid level. Electronically Signed   By: Lajean Manes M.D.   On: 07/09/2020 15:56   CT Angio Chest Pulmonary Embolism (PE) W or WO Contrast  Result Date: 07/10/2020 CLINICAL DATA:  Elevated D-dimer. Nonproductive cough for 1 week. Clinical suspicion for pulmonary embolism. EXAM: CT ANGIOGRAPHY CHEST WITH CONTRAST TECHNIQUE: Multidetector CT imaging of the chest was performed using the standard protocol during bolus administration of intravenous contrast. Multiplanar CT image reconstructions and MIPs were obtained to evaluate the vascular anatomy. CONTRAST:  54m OMNIPAQUE IOHEXOL 350 MG/ML SOLN COMPARISON:  None. FINDINGS: Cardiovascular: Satisfactory opacification of pulmonary arteries noted, however evaluation of peripheral pulmonary arteries in the lung bases is limited by bibasilar atelectasis. No pulmonary emboli are identified. No evidence of thoracic aortic dissection or aneurysm. Aortic and coronary atherosclerotic calcification noted. Mediastinum/Nodes: No masses or pathologically enlarged lymph nodes identified. Lungs/Pleura: Atelectasis is noted in both lung bases. Mild atelectasis or scarring is also seen in the anterior right upper lobe. No pulmonary mass, infiltrate, or effusion. Upper abdomen: No acute findings. Musculoskeletal: No suspicious bone lesions identified. An old wedge compression fracture of the T6 vertebral body is noted. Review of the MIP images confirms the above findings. IMPRESSION: No evidence of pulmonary embolism. Bilateral subsegmental atelectasis. Aortic Atherosclerosis (ICD10-I70.0). Electronically Signed   By: JMarlaine HindM.D.   On: 07/10/2020 11:35   UK[place]Venous Img Lower Bilateral  Result Date: 07/09/2020 CLINICAL DATA:  Leg swelling, evaluate for DVT EXAM: BILATERAL LOWER EXTREMITY VENOUS DOPPLER ULTRASOUND TECHNIQUE: Gray-scale sonography with compression, as well as color and duplex ultrasound, were  performed to evaluate the deep venous system(s) from the level of the common femoral vein through the popliteal and proximal calf veins. COMPARISON:  None. FINDINGS: VENOUS Normal compressibility of the common femoral, superficial femoral, and popliteal veins, as well as the visualized calf veins. Visualized portions of profunda femoral vein and great saphenous vein unremarkable. No filling defects to suggest DVT on grayscale or color Doppler imaging. Doppler waveforms show normal direction of venous flow, normal respiratory plasticity and response to augmentation. Limited views of the contralateral common femoral vein are unremarkable. OTHER None. Limitations: none IMPRESSION: Negative examination for deep venous thrombosis in the bilateral lower extremities. Electronically Signed   By: AEddie CandleM.D.   On: 07/09/2020 18:41   DG Chest Portable 1 View  Result Date: 07/09/2020 CLINICAL DATA:  Fall.  Left hip pain. EXAM: PORTABLE CHEST 1 VIEW COMPARISON:  March 13, 2019 FINDINGS: Cardiomediastinal silhouette is stable. Stable mild cardiomegaly. Stable tortuous thoracic aorta. No pneumothorax. No nodules or masses. No focal infiltrates. No acute abnormalities. IMPRESSION: No active disease. Electronically Signed   By: DDorise BullionIII M.D   On: 07/09/2020 16:21   ECHOCARDIOGRAM COMPLETE  Result Date: 07/10/2020    ECHOCARDIOGRAM REPORT   Patient Name:   LLAKAYLA BARRINGTONDate of Exam: 07/10/2020 Medical Rec #:  [id number]       Height:       65.0 in Accession #:    [id number]      Weight:       159.8 lb Date of Birth:  109-17-1926      BSA:          1.798 m Patient Age:    89years         BP:           107/58 mmHg Patient Gender: F                HR:           70 bpm. Exam Location:  [facility] Procedure: 2D Echo, Cardiac Doppler and Color Doppler Indications:     Elevtated  troponin  History:         Patient has no prior history of Echocardiogram examinations.                  Stroke; Risk  Factors:Hypertension.  Sonographer:     Sherrie Sport RDCS (AE) Referring Phys:  [id number] Rhetta Mura Diagnosing Phys: Kathlyn Sacramento MD IMPRESSIONS  1. Left ventricular ejection fraction, by estimation, is 60 to 65%. The left ventricle has normal function. The left ventricle has no regional wall motion abnormalities. There is mild left ventricular hypertrophy. Left ventricular diastolic parameters are consistent with Grade II diastolic dysfunction (pseudonormalization).  2. Right ventricular systolic function is normal. The right ventricular size is normal. There is moderately elevated pulmonary artery systolic pressure. The estimated right ventricular systolic pressure is 24.0 mmHg.  3. Left atrial size was moderately dilated.  4. The mitral valve is normal in structure. Mild to moderate mitral valve regurgitation. No evidence of mitral stenosis. Moderate mitral annular calcification.  5. Tricuspid valve regurgitation is moderate.  6. The aortic valve is normal in structure. Aortic valve regurgitation is mild. Mild aortic valve stenosis. Aortic valve area, by VTI measures 1.87 cm. Aortic valve mean gradient measures 8.0 mmHg. FINDINGS  Left Ventricle: Left ventricular ejection fraction, by estimation, is 60 to 65%. The left ventricle has normal function. The left ventricle has no regional wall motion abnormalities. The left ventricular internal cavity size was normal in size. There is  mild left ventricular hypertrophy. Left ventricular diastolic parameters are consistent with Grade II diastolic dysfunction (pseudonormalization). Right Ventricle: The right ventricular size is normal. No increase in right ventricular wall thickness. Right ventricular systolic function is normal. There is moderately elevated pulmonary artery systolic pressure. The tricuspid regurgitant velocity is 3.52 m/s, and with an assumed right atrial pressure of 10 mmHg, the estimated right ventricular systolic pressure is 97.3 mmHg. Left  Atrium: Left atrial size was moderately dilated. Right Atrium: Right atrial size was normal in size. Pericardium: There is no evidence of pericardial effusion. Mitral Valve: The mitral valve is normal in structure. Moderate mitral annular calcification. Mild to moderate mitral valve regurgitation. No evidence of mitral valve stenosis. Tricuspid Valve: The tricuspid valve is normal in structure. Tricuspid valve regurgitation is moderate . No evidence of tricuspid stenosis. Aortic Valve: The aortic valve is normal in structure. Aortic valve regurgitation is mild. Mild aortic stenosis is present. Aortic valve mean gradient measures 8.0 mmHg. Aortic valve peak gradient measures 14.6 mmHg. Aortic valve area, by VTI measures 1.87 cm. Pulmonic Valve: The pulmonic valve was normal in structure. Pulmonic valve regurgitation is mild. No evidence of pulmonic stenosis. Aorta: The aortic root is normal in size and structure. Venous: The inferior vena cava was not well visualized. IAS/Shunts: No atrial level shunt detected by color flow Doppler.  LEFT VENTRICLE PLAX 2D LVIDd:         4.14 cm  Diastology LVIDs:         2.71 cm  LV e' medial:    5.11 cm/s LV PW:         1.18 cm  LV E/e' medial:  20.9 LV IVS:        1.23 cm  LV e' lateral:   4.79 cm/s LVOT diam:     2.00 cm  LV E/e' lateral: 22.3 LV SV:         63 LV SV Index:   35 LVOT Area:     3.14 cm  RIGHT VENTRICLE  RV S prime:     13.70 cm/s TAPSE (M-mode): 3.5 cm LEFT ATRIUM            Index       RIGHT ATRIUM           Index LA diam:      3.90 cm  2.17 cm/m  RA Area:     17.30 cm LA Vol (A2C): 164.0 ml 91.19 ml/m RA Volume:   53.40 ml  29.69 ml/m LA Vol (A4C): 60.1 ml  33.42 ml/m  AORTIC VALVE                    PULMONIC VALVE AV Area (Vmax):    1.63 cm     PV Vmax:        0.74 m/s AV Area (Vmean):   1.68 cm     PV Peak grad:   2.2 mmHg AV Area (VTI):     1.87 cm     RVOT Peak grad: 3 mmHg AV Vmax:           191.33 cm/s AV Vmean:          129.000 cm/s AV VTI:             0.339 m AV Peak Grad:      14.6 mmHg AV Mean Grad:      8.0 mmHg LVOT Vmax:         99.20 cm/s LVOT Vmean:        69.100 cm/s LVOT VTI:          0.202 m LVOT/AV VTI ratio: 0.60  AORTA Ao Root diam: 3.07 cm MITRAL VALVE                TRICUSPID VALVE MV Area (PHT): 3.36 cm     TR Peak grad:   49.6 mmHg MV Decel Time: 226 msec     TR Vmax:        352.00 cm/s MV E velocity: 107.00 cm/s MV A velocity: 126.00 cm/s  SHUNTS MV E/A ratio:  0.85         Systemic VTI:  0.20 m                             Systemic Diam: 2.00 cm Kathlyn Sacramento MD Electronically signed by Kathlyn Sacramento MD Signature Date/Time: 07/10/2020/11:21:50 AM    Final    DG Hip Unilat W or Wo Pelvis 2-3 Views Left  Result Date: 07/09/2020 CLINICAL DATA:  Pain after fall EXAM: DG HIP (WITH OR WITHOUT PELVIS) 2-3V LEFT COMPARISON:  None. FINDINGS: Mild irregularity along the lateral aspect of the left femoral head is likely a small osteophyte. No fracture or dislocation. Severe degenerative changes in the right hip with near complete loss of joint space and bony sclerosis. No other acute abnormalities. IMPRESSION: No acute fractures. Electronically Signed   By: Dorise Bullion III M.D   On: 07/09/2020 16:20        Scheduled Meds:  anastrozole  1 mg Oral Daily   vitamin C  500 mg Oral Daily   aspirin EC  81 mg Oral Daily   enoxaparin (LOVENOX) injection  40 mg Subcutaneous Q24H   Ipratropium-Albuterol  2 puff Inhalation Q6H   loratadine  10 mg Oral Daily   metoprolol tartrate  25 mg Oral Daily   nystatin   Topical BID   pantoprazole  40 mg Oral Q0600  PARoxetine  30 mg Oral Daily   zinc sulfate  220 mg Oral Daily   Continuous Infusions:  cefTRIAXone (ROCEPHIN)  IV 1 g (07/10/20 1448)   remdesivir 100 mg in NS 100 mL 100 mg (07/11/20 1059)     LOS: 2 days    Time spent: 40 mins    Irine Seal, MD [facility]   To contact the attending provider between 7A-7P or the covering provider during after hours  7P-7A, please log into the web site [email or web address] and access using universal [place] password for that web site. If you do not have the password, please call the hospital operator.  07/11/2020, 11:09 AM

## 2020-07-11 NOTE — Progress Notes (Signed)
Living will on the chart

## 2020-07-11 NOTE — NC FL2 (Signed)
[place] LEVEL OF CARE SCREENING TOOL     IDENTIFICATION  Patient Name: [name] Birthdate: [birth year] Sex: female Admission Date (Current Location): 07/09/2020  [place] and [place] Number:  Engineering geologist and Address:  [facility], [address] [place], [place] [address]      Provider Number: [id number]  Attending Physician Name and Address:  Eugenie Filler, MD  Relative Name and Phone Number:  Philomena Doheny [phone]    Current Level of Care: Hospital Recommended Level of Care: [place] Prior Approval Number:    Date Approved/Denied:   PASRR Number: [id number]  Discharge Plan: SNF    Current Diagnoses: Patient Active Problem List   Diagnosis Date Noted   Generalized weakness 07/10/2020   Fall at home, initial encounter 07/10/2020   COVID-19 virus infection 07/10/2020   Lower extremity edema 07/10/2020   Elevated troponin 07/10/2020   Hypertension    Anemia    Acute cystitis 07/09/2020   Stage 3 chronic kidney disease ([place]) 04/22/2020   Prediabetes 04/22/2020   Personal history of transient ischemic attack (TIA), and cerebral infarction without residual deficits 04/22/2020   Osteoarthritis 04/22/2020   Mixed hyperlipidemia 04/22/2020   Malnutrition ([place]) 04/22/2020   Major depression, single episode 04/22/2020   Edema 04/22/2020   Disorder of arteries and arterioles, unspecified ([place]) 04/22/2020   Dementia ([place]) 04/22/2020   Genetic testing 11/13/2018   Malignant neoplasm of central portion of left breast in female, estrogen receptor positive ([place]) 10/26/2018   Osteopenia of lower leg 10/26/2018   Neck pain 05/05/2015   Occlusion and stenosis of carotid artery without mention of cerebral infarction 05/27/2012    Orientation RESPIRATION BLADDER Height & Weight     Self, Time, Situation, Place  Normal External catheter Weight: 71.5 kg Height:  5\' 5"  (165.1 cm)  BEHAVIORAL  SYMPTOMS/MOOD NEUROLOGICAL BOWEL NUTRITION STATUS      Continent    AMBULATORY STATUS COMMUNICATION OF NEEDS Skin   Limited Assist Verbally                         Personal Care Assistance Level of Assistance  Bathing, Feeding, Dressing Bathing Assistance: Limited assistance Feeding assistance: Limited assistance Dressing Assistance: Limited assistance     Functional Limitations Info  Sight, Hearing, Speech Sight Info: Adequate Hearing Info: Adequate Speech Info: Adequate    SPECIAL CARE FACTORS FREQUENCY  PT (By licensed PT), OT (By licensed OT)     PT Frequency: 5X WEEK OT Frequency: 5X WEEK            Contractures Contractures Info: Not present    Additional Factors Info  Code Status, Allergies Code Status Info: Do not Resuscitate Allergies Info: Fluogen, Penicillins, SulfaCodeine, Prednisone, Sulfamethoxazole           Current Medications (07/11/2020):  This is the current hospital active medication list Current Facility-Administered Medications  Medication Dose Route Frequency Provider Last Rate Last Admin   acetaminophen (TYLENOL) tablet 650 mg  650 mg Oral Q6H PRN Howerter, Justin B, DO   650 mg at 07/09/20 2228   Or   acetaminophen (TYLENOL) suppository 650 mg  650 mg Rectal Q6H PRN Howerter, Justin B, DO       albuterol (VENTOLIN HFA) 108 (90 Base) MCG/ACT inhaler 1-2 puff  1-2 puff Inhalation Q4H PRN Howerter, Justin B, DO       anastrozole (ARIMIDEX) tablet 1 mg  1 mg Oral Daily  Howerter, Justin B, DO   1 mg at 07/11/20 1053   ascorbic acid (VITAMIN C) tablet 500 mg  500 mg Oral Daily Eugenie Filler, MD   500 mg at 07/11/20 1052   aspirin EC tablet 81 mg  81 mg Oral Daily Howerter, Justin B, DO   81 mg at 07/11/20 1052   cefTRIAXone (ROCEPHIN) 1 g in sodium chloride 0.9 % 100 mL IVPB  1 g Intravenous Q24H Howerter, Justin B, DO 200 mL/hr at 07/10/20 1448 1 g at 07/10/20 1448   enoxaparin (LOVENOX) injection 40 mg  40 mg Subcutaneous Q24H  Eugenie Filler, MD   40 mg at 07/11/20 1053   guaiFENesin-dextromethorphan (ROBITUSSIN DM) 100-10 MG/5ML syrup 10 mL  10 mL Oral Q4H PRN Eugenie Filler, MD       Ipratropium-Albuterol (COMBIVENT) respimat 2 puff  2 puff Inhalation Q6H Eugenie Filler, MD   2 puff at 07/11/20 0910   loratadine (CLARITIN) tablet 10 mg  10 mg Oral Daily Eugenie Filler, MD   10 mg at 07/11/20 1052   metoprolol tartrate (LOPRESSOR) injection 2.5 mg  2.5 mg Intravenous Q4H PRN Howerter, Justin B, DO       metoprolol tartrate (LOPRESSOR) tablet 25 mg  25 mg Oral Daily Howerter, Justin B, DO   25 mg at 07/11/20 1052   nystatin (MYCOSTATIN/NYSTOP) topical powder   Topical BID Howerter, Justin B, DO   Given at 07/11/20 1052   pantoprazole (PROTONIX) EC tablet 40 mg  40 mg Oral Q0600 Eugenie Filler, MD   40 mg at 07/11/20 1059   PARoxetine (PAXIL) tablet 30 mg  30 mg Oral Daily Howerter, Justin B, DO   30 mg at 07/11/20 1053   zinc sulfate capsule 220 mg  220 mg Oral Daily Eugenie Filler, MD   220 mg at 07/11/20 1052     Discharge Medications: Please see discharge summary for a list of discharge medications.  Relevant Imaging Results:  Relevant Lab Results:   Additional Information Positive for COVID 07/09/20. SS$ [id number]  Kerin Salen, RN

## 2020-07-12 LAB — CBC WITH DIFFERENTIAL/PLATELET
Abs Immature Granulocytes: 0.03 10*3/uL (ref 0.00–0.07)
Basophils Absolute: 0 10*3/uL (ref 0.0–0.1)
Basophils Relative: 0 %
Eosinophils Absolute: 0.3 10*3/uL (ref 0.0–0.5)
Eosinophils Relative: 5 %
HCT: 24.8 % — ABNORMAL LOW (ref 36.0–46.0)
Hemoglobin: 8.3 g/dL — ABNORMAL LOW (ref 12.0–15.0)
Immature Granulocytes: 1 %
Lymphocytes Relative: 43 %
Lymphs Abs: 2.5 10*3/uL (ref 0.7–4.0)
MCH: 33.6 pg (ref 26.0–34.0)
MCHC: 33.5 g/dL (ref 30.0–36.0)
MCV: 100.4 fL — ABNORMAL HIGH (ref 80.0–100.0)
Monocytes Absolute: 0.4 10*3/uL (ref 0.1–1.0)
Monocytes Relative: 8 %
Neutro Abs: 2.6 10*3/uL (ref 1.7–7.7)
Neutrophils Relative %: 43 %
Platelets: 155 10*3/uL (ref 150–400)
RBC: 2.47 MIL/uL — ABNORMAL LOW (ref 3.87–5.11)
RDW: 11.9 % (ref 11.5–15.5)
WBC: 5.8 10*3/uL (ref 4.0–10.5)
nRBC: 0 % (ref 0.0–0.2)

## 2020-07-12 LAB — COMPREHENSIVE METABOLIC PANEL
ALT: 15 U/L (ref 0–44)
AST: 29 U/L (ref 15–41)
Albumin: 2.7 g/dL — ABNORMAL LOW (ref 3.5–5.0)
Alkaline Phosphatase: 46 U/L (ref 38–126)
Anion gap: 6 (ref 5–15)
BUN: 21 mg/dL (ref 8–23)
CO2: 25 mmol/L (ref 22–32)
Calcium: 8.4 mg/dL — ABNORMAL LOW (ref 8.9–10.3)
Chloride: 107 mmol/L (ref 98–111)
Creatinine, Ser: 0.83 mg/dL (ref 0.44–1.00)
GFR, Estimated: >60 mL/min (ref >60)
Glucose, Bld: 103 mg/dL — ABNORMAL HIGH (ref 70–99)
Potassium: 3.7 mmol/L (ref 3.5–5.1)
Sodium: 138 mmol/L (ref 135–145)
Total Bilirubin: 0.7 mg/dL (ref 0.3–1.2)
Total Protein: 5.8 g/dL — ABNORMAL LOW (ref 6.5–8.1)

## 2020-07-12 LAB — D-DIMER, QUANTITATIVE: D-Dimer, Quant: 1.95 ug/mL-FEU — ABNORMAL HIGH (ref 0.00–0.50)

## 2020-07-12 LAB — FERRITIN: Ferritin: 106 ng/mL (ref 11–307)

## 2020-07-12 LAB — URINE CULTURE: Culture: >=100000 — AB

## 2020-07-12 LAB — MAGNESIUM: Magnesium: 1.8 mg/dL (ref 1.7–2.4)

## 2020-07-12 LAB — PHOSPHORUS: Phosphorus: 2.9 mg/dL (ref 2.5–4.6)

## 2020-07-12 LAB — C-REACTIVE PROTEIN: CRP: 6 mg/dL — ABNORMAL HIGH (ref <1.0)

## 2020-07-12 MED ORDER — CEPHALEXIN 500 MG PO CAPS
500.0000 mg | ORAL_CAPSULE | Freq: Three times a day (TID) | ORAL | Status: DC
  Administered 2020-07-12 – 2020-07-16 (×12): 500 mg via ORAL
  Filled 2020-07-12 (×12): qty 1

## 2020-07-12 NOTE — Progress Notes (Signed)
   07/09/20 2054 07/09/20 2103  Treat  MEWS Interventions Other (Comment) (PRN med ordered by provider)  --   Take Vital Signs  Increase Vital Sign Frequency  Red: Q 1hr X 4 then Q 4hr X 4, if remains red, continue Q 4hrs  --   Escalate  MEWS: Escalate Red: discuss with charge nurse/RN and provider, consider discussing with RRT  --   Notify: Charge Nurse/RN  Name of Charge Nurse/RN Notified Mammie Russian  --   Date Charge Nurse/RN Notified 07/09/20  --   Time Charge Nurse/RN Notified 2058  --   Notify: Provider  Provider Name/Title Paul Dykes  --   Date Provider Notified 07/09/20  --   Time Provider Notified 2103  --   Notification Type Page  --   Notification Reason Change in status  --   Provider response See new orders  --   Notify: Rapid Response  Name of Rapid Response RN Notified  --  Danae Chen  RN, ICU CN  Date Rapid Response Notified  --  07/09/20  Time Rapid Response Notified  --  2103

## 2020-07-12 NOTE — Progress Notes (Signed)
Physical Therapy Treatment Patient Details Name: [name] MRN: [id number] DOB: 02/04/1924 Today's Date: 07/12/2020    History of Present Illness Pt is a 87 y/o F admitted on 07/09/20 with c/c of generalized weakness. Pt being treated for acute cystitis. Pt found to be COVID (+). Pt endorses 2 falls 2/2 tripping over the last 10 days. PMH: HTN, R knee arthritis, carotid artery occlusion, depression, HLD, mini stroke (July 2005)    PT Comments    Pt was long sitting in bed upon arriving. She is alert and oriented x 2 but able to follow commands consistently throughout. Pt eager for OOB. She required max assist to exit R side of bed. Stood to [employer] with mod assist. Pt has severe posterior lean requiring constant assistance. Pt proceeded to ambulate to recliner. Sao2 dropped to 84% but quickly recovers to >94% once sitting. Overall, pt is extremely high fall risk due to posterior lean with all static/dynamic standing activity. Chief Strategy Officer assisted with ordering lunch and calling pt's daughter per RN request. At conclusion of session, pt was sitting in recliner with chair alarm in place, call bell in reach, and RN aware of abilities. Pt will require SNF at DC to address deficits while improving safety with ADLs.   Follow Up Recommendations  SNF     Equipment Recommendations  Other (comment) (defer to next level of care)       Precautions / Restrictions Precautions Precautions: Fall Restrictions Weight Bearing Restrictions: No    Mobility  Bed Mobility Overal bed mobility: Needs Assistance Bed Mobility: Supine to Sit;Sit to Supine     Supine to sit: Mod assist;HOB elevated;Max assist     General bed mobility comments: Mod-max of one to achieve EOB short sit. Increased time however pt puts forth good effort throughout. C/O dizziness upon sitting up. BP 151/74. Symptoms resolved with prolonged sitting.    Transfers Overall transfer level: Needs assistance Equipment used: Rolling  walker (2 wheeled) Transfers: Sit to/from Stand Sit to Stand: Mod assist Stand pivot transfers: Max assist       General transfer comment: pt was able to stand from EOB to RW with mod assist. does have posterior push/lean.  Ambulation/Gait Ambulation/Gait assistance: Mod assist;Max assist Gait Distance (Feet): 5 Feet Assistive device: Rolling walker (2 wheeled) Gait Pattern/deviations: Step-to pattern Gait velocity: decreased   General Gait Details: Pt has severe posterior lean with dynamic activity. BUE support on RW throughout. constant assistance to prevent fall.        Balance Overall balance assessment: Needs assistance Sitting-balance support: Feet supported;Bilateral upper extremity supported Sitting balance-Leahy Scale: Fair Sitting balance - Comments: Did sit EOB x ~ 5 minutes without LOB. poor posture throughout   Standing balance support: During functional activity;Bilateral upper extremity supported Standing balance-Leahy Scale: Poor Standing balance comment: severe posterior lean throughout all standing activity        Cognition Arousal/Alertness: Awake/alert Behavior During Therapy: WFL for tasks assessed/performed Overall Cognitive Status: No family/caregiver present to determine baseline cognitive functioning      General Comments: Pt was A and O x 2. Cooperative and pleasant throughout.             Pertinent Vitals/Pain Pain Assessment: No/denies pain    PT Goals (current goals can now be found in the care plan section) Acute Rehab PT Goals Patient Stated Goal: feel better Progress towards PT goals: Progressing toward goals    Frequency    Min 2X/week      PT Plan  Current plan remains appropriate       AM-PAC PT "6 Clicks" Mobility   Outcome Measure  Help needed turning from your back to your side while in a flat bed without using bedrails?: A Lot Help needed moving from lying on your back to sitting on the side of a flat bed  without using bedrails?: A Lot Help needed moving to and from a bed to a chair (including a wheelchair)?: A Lot Help needed standing up from a chair using your arms (e.g., wheelchair or bedside chair)?: A Lot Help needed to walk in hospital room?: A Lot Help needed climbing 3-5 steps with a railing? : Total 6 Click Score: 11    End of Session Equipment Utilized During Treatment: Gait belt Activity Tolerance: Patient limited by fatigue Patient left: in chair;with chair alarm set;with call bell/phone within reach Nurse Communication: Mobility status PT Visit Diagnosis: Unsteadiness on feet (R26.81);Difficulty in walking, not elsewhere classified (R26.2);Muscle weakness (generalized) (M62.81)     Time: 5670-1410 PT Time Calculation (min) (ACUTE ONLY): 20 min  Charges:  $Therapeutic Activity: 8-22 mins                     Julaine Fusi PTA 07/12/20, 12:43 PM

## 2020-07-12 NOTE — TOC Progression Note (Signed)
Transition of Care [facility]) - Progression Note    Patient Details  Name: [name] MRN: [id number] Date of Birth: [birth year]  Transition of Care [facility]) CM/SW [place], RN Phone Number: 07/12/2020, 10:09 AM  Clinical Narrative: Did receive a call after hous, 6pm from [facility] admission coordinator who said that they do not admit COVID+ patients until day eleven; however at this time they do not have a bed.     Expected Discharge Plan: [place] Barriers to Discharge: Continued Medical Work up  Expected Discharge Plan and Services Expected Discharge Plan: [place]   Discharge Planning Services: CM Consult   Living arrangements for the past 2 months: Single Family Home                                       Social Determinants of Health (SDOH) Interventions    Readmission Risk Interventions No flowsheet data found.

## 2020-07-12 NOTE — TOC Progression Note (Signed)
Transition of Care [facility]) - Progression Note    Patient Details  Name: [name] MRN: [id number] Date of Birth: [birth year]  Transition of Care [facility]) CM/SW Contact  Beverly Sessions, RN Phone Number: 07/12/2020, 10:24 AM  Clinical Narrative:     Bed search extended.  Currently no bed offers that can accept patient for before 10 day isolation period  Expected Discharge Plan: [place] Barriers to Discharge: Continued Medical Work up  Expected Discharge Plan and Services Expected Discharge Plan: [place]   Discharge Planning Services: CM Consult   Living arrangements for the past 2 months: Single Family Home                                       Social Determinants of Health (SDOH) Interventions    Readmission Risk Interventions No flowsheet data found.

## 2020-07-12 NOTE — TOC Progression Note (Signed)
Transition of Care [facility]) - Progression Note    Patient Details  Name: [name] MRN: [id number] Date of Birth: [birth year]  Transition of Care [facility]) CM/SW Contact  Beverly Sessions, RN Phone Number: 07/12/2020, 10:27 AM  Clinical Narrative:     Bed search extended.  Currently no bed offers that can accept patient for before 10 day isolation period  Referral faxed to Elyse Hsu at [facility] per family request.  They would be unable to accept prior to 10 day isolation period   Expected Discharge Plan: Skilled Nursing Facility Barriers to Discharge: Continued Medical Work up  Expected Discharge Plan and Services Expected Discharge Plan: [place]   Discharge Planning Services: CM Consult   Living arrangements for the past 2 months: Single Family Home                                       Social Determinants of Health (SDOH) Interventions    Readmission Risk Interventions No flowsheet data found.

## 2020-07-12 NOTE — Progress Notes (Signed)
PROGRESS NOTE  [name]  DOB: [birth year]  PCP: Kelton Pillar, MD WYO:[id number]  DOA: 07/09/2020  LOS: 3 days  Hospital Day: 4   Chief Complaint  Patient presents with   Fall    Brief narrative: [name] is a 87 y.o. female with PMH significant for hypertension.  Patient presented to the ED on 6/26 with acute cystitis, generalized weakness, fall progressive for last 2 weeks with no focal neurological deficits. In the ED, patient was noted to have COVID 19 PCR test positive Urinalysis consistent with UTI.  Troponin elevated without cardiac symptoms.  Admitted to hospitalist service for further evaluation management.   Subjective: Patient was seen and examined this morning.  Pleasant elderly Caucasian female.  Lying in bed.  Not in distress.  Oriented to place and person.  Assessment/Plan: Acute cystitis/E. coli UTI -Patient presented with a 7-day history of new onset dysuria, urinary urgency, generalized weakness. -Urinalysis consistent with UTI. -Urine cultures with > 100,000 E Coli with sensitivities pending. -Patient with no flank tenderness. -Blood cultures pending with no growth to date. -Currently on IV Rocephin.  Based on culture sensitivity, okay to switch to oral Keflex.  COVID-19 PCR positive -No evidence of pneumonia in the chest x-ray. -Not requiring supplemental oxygen. -On IV remdesivir.  Hold off steroids. -Continue bronchodilators, pulm toileting Recent Labs  Lab 07/09/20 1505 07/09/20 1711 07/09/20 2127 07/10/20 0725 07/11/20 0502 07/12/20 0517  SARSCOV2NAA  --  POSITIVE*  --   --   --   --   WBC 6.9  --   --  4.5 5.6 5.8  PROCALCITON <0.10  --   --   --   --   --   DDIMER 2.35*  --   --  1.51* 1.77* 1.95*  FERRITIN 140  --   --  137 119 106  LDH 195*  --   --  151  --   --   CRP  --   --  3.9* 5.6* 6.6* 6.0*  ALT 14  --   --  14 16 15    Generalized weakness/falls -Likely multifactorial secondary to UTI, positive COVID-19  infection. -Slowly improving clinically. -PT eval obtained.  Chronic anemia -Hemoglobin stable between 8-9 Recent Labs    05/01/20 1029 07/09/20 1505 07/10/20 0725 07/11/20 0502 07/12/20 0517  HGB 11.5* 10.1* 8.1* 8.1* 8.3*  MCV 103.8* 101.0* 100.8* 100.9* 100.4*  VITAMINB12  --   --   --  1,149*  --   FOLATE  --   --   --  12.6  --   FERRITIN  --  140 137 119 106  TIBC  --   --   --  252  --   IRON  --   --   --  64  --    Lower extremity edema -Patient with new onset lower extremity edema R > L. -Edema may be secondary to albuminemia. -Lower extremity Dopplers negative for DVT. -2D echo with EF of 60 to 65%, NWMA, mild LVH, grade 2 diastolic dysfunction, mild to moderate MVR, moderate TVR.   -Patient with no history of liver disease or recent travel.  Elevated troponin -Patient mildly elevated troponin likely secondary to demand ischemia. -Patient asymptomatic, denies any chest pain or significant shortness of breath.   -EKG with nonspecific T wave inversion in aVL otherwise no changes of ischemia noted. -ACS less likely. -2D echo with EF of 60 to 65%, NWMA, mild LVH, grade 2 diastolic dysfunction, mild to  moderate MVR, moderate TVR.   -No further cardiac work-up needed at this time.  Hypertension -Continue to hold HCTZ.   -Lopressor.  History of breast cancer in remission -Continue Arimidex.   Mobility: Encourage ambulation Code Status:   Code Status: DNR  Nutritional status: Body mass index is 26.23 kg/m.     Diet Order             Diet regular Room service appropriate? Yes; Fluid consistency: Thin  Diet effective now                   DVT prophylaxis:  enoxaparin (LOVENOX) injection 40 mg Start: 07/11/20 1000 Place and maintain sequential compression device Start: 07/10/20 1726 SCDs Start: 07/09/20 1839   Antimicrobials: IV Rocephin Fluid: None Consultants: None Family Communication: None at bedside  Status is: Inpatient  Remains inpatient  appropriate because: Pending placement  Dispo: The patient is from: Home              Anticipated d/c is to: Rehab              Patient currently is medically stable to d/c.   Difficult to place patient No     Infusions:   sodium chloride 75 mL/hr at 07/12/20 5053   cefTRIAXone (ROCEPHIN)  IV 1 g (07/11/20 1608)    Scheduled Meds:  anastrozole  1 mg Oral Daily   vitamin C  500 mg Oral Daily   aspirin EC  81 mg Oral Daily   enoxaparin (LOVENOX) injection  40 mg Subcutaneous Q24H   Ipratropium-Albuterol  2 puff Inhalation Q6H   loratadine  10 mg Oral Daily   metoprolol tartrate  25 mg Oral Daily   nystatin   Topical BID   pantoprazole  40 mg Oral Q0600   PARoxetine  30 mg Oral Daily   zinc sulfate  220 mg Oral Daily    Antimicrobials: Anti-infectives (From admission, onward)    Start     Dose/Rate Route Frequency Ordered Stop   07/10/20 1500  cefTRIAXone (ROCEPHIN) 1 g in sodium chloride 0.9 % 100 mL IVPB        1 g 200 mL/hr over 30 Minutes Intravenous Every 24 hours 07/09/20 1841     07/10/20 1000  remdesivir 100 mg in sodium chloride 0.9 % 100 mL IVPB       See Hyperspace for full Linked Orders Report.   100 mg 200 mL/hr over 30 Minutes Intravenous Daily 07/09/20 1956 07/11/20 1129   07/09/20 2200  remdesivir 200 mg in sodium chloride 0.9% 250 mL IVPB       See Hyperspace for full Linked Orders Report.   200 mg 580 mL/hr over 30 Minutes Intravenous  Once 07/09/20 1956 07/09/20 2207   07/09/20 1800  cefTRIAXone (ROCEPHIN) 1 g in sodium chloride 0.9 % 100 mL IVPB        1 g 200 mL/hr over 30 Minutes Intravenous  Once 07/09/20 1749 07/09/20 1823       PRN meds: acetaminophen **OR** acetaminophen, albuterol, guaiFENesin-dextromethorphan, hydrALAZINE, metoprolol tartrate   Objective: Vitals:   07/12/20 0446 07/12/20 0825  BP: (!) 138/57 (!) 155/68  Pulse: 68 78  Resp: 18 16  Temp: 98.3 F (36.8 C) 98.6 F (37 C)  SpO2: 91% 94%    Intake/Output Summary  (Last 24 hours) at 07/12/2020 1215 Last data filed at 07/12/2020 9767 Gross per 24 hour  Intake 1233.66 ml  Output 2900 ml  Net -1666.34 ml  Filed Weights   07/09/20 1513 07/11/20 0600  Weight: 72.5 kg 71.5 kg   Weight change:  Body mass index is 26.23 kg/m.   Physical Exam: General exam: Pleasant, elderly Caucasian female.  Not in distress Skin: No rashes, lesions or ulcers. HEENT: Atraumatic, normocephalic, no obvious bleeding Lungs: Clear to auscultation bilaterally CVS: Regular rate and rhythm, no murmur GI/Abd soft, nontender, nondistended, bowel sound present CNS: Alert, awake, knows she is in the hospital. Psychiatry: Mood appropriate Extremities: Right wrist pedal edema  Data Review: I have personally reviewed the laboratory data and studies available.  Recent Labs  Lab 07/09/20 1505 07/10/20 0725 07/11/20 0502 07/12/20 0517  WBC 6.9 4.5 5.6 5.8  NEUTROABS 5.2  --  2.6 2.6  HGB 10.1* 8.1* 8.1* 8.3*  HCT 29.7* 24.3* 23.7* 24.8*  MCV 101.0* 100.8* 100.9* 100.4*  PLT 184 137* 137* 155   Recent Labs  Lab 07/09/20 1505 07/09/20 1711 07/10/20 0725 07/11/20 0502 07/12/20 0517  NA 134*  --  135 136 138  K 4.1  --  3.8 3.8 3.7  CL 103  --  105 105 107  CO2 22  --  23 24 25   GLUCOSE 112*  --  104* 103* 103*  BUN 34*  --  27* 25* 21  CREATININE 1.07*  --  1.02* 0.82 0.83  CALCIUM 8.4*  --  8.0* 8.1* 8.4*  MG  --  1.7 2.2 1.8 1.8  PHOS  --   --  3.0 2.7 2.9    F/u labs ordered Unresulted Labs (From admission, onward)     Start     Ordered   07/11/20 0500  CBC with Differential/Platelet  Daily,   R     Question:  Specimen collection method  Answer:  Lab=Lab collect   07/10/20 1741   07/11/20 0500  Comprehensive metabolic panel  Daily,   R     Question:  Specimen collection method  Answer:  Lab=Lab collect   07/10/20 1741   07/11/20 0500  C-reactive protein  Daily,   R     Question:  Specimen collection method  Answer:  Lab=Lab collect   07/10/20 1741    07/11/20 0500  D-dimer, quantitative  Daily,   R      07/10/20 1741   07/11/20 0500  Ferritin  Daily,   R     Question:  Specimen collection method  Answer:  Lab=Lab collect   07/10/20 1741   07/11/20 0500  Magnesium  Daily,   R     Question:  Specimen collection method  Answer:  Lab=Lab collect   07/10/20 1741   07/11/20 0500  Phosphorus  Daily,   R     Question:  Specimen collection method  Answer:  Lab=Lab collect   07/10/20 1741            Signed, Terrilee Croak, MD [facility] 07/12/2020

## 2020-07-13 LAB — CBC WITH DIFFERENTIAL/PLATELET
Abs Immature Granulocytes: 0.03 10*3/uL (ref 0.00–0.07)
Basophils Absolute: 0 10*3/uL (ref 0.0–0.1)
Basophils Relative: 0 %
Eosinophils Absolute: 0.4 10*3/uL (ref 0.0–0.5)
Eosinophils Relative: 5 %
HCT: 26.2 % — ABNORMAL LOW (ref 36.0–46.0)
Hemoglobin: 9.1 g/dL — ABNORMAL LOW (ref 12.0–15.0)
Immature Granulocytes: 0 %
Lymphocytes Relative: 36 %
Lymphs Abs: 2.4 10*3/uL (ref 0.7–4.0)
MCH: 34.7 pg — ABNORMAL HIGH (ref 26.0–34.0)
MCHC: 34.7 g/dL (ref 30.0–36.0)
MCV: 100 fL (ref 80.0–100.0)
Monocytes Absolute: 0.5 10*3/uL (ref 0.1–1.0)
Monocytes Relative: 7 %
Neutro Abs: 3.5 10*3/uL (ref 1.7–7.7)
Neutrophils Relative %: 52 %
Platelets: 202 10*3/uL (ref 150–400)
RBC: 2.62 MIL/uL — ABNORMAL LOW (ref 3.87–5.11)
RDW: 11.8 % (ref 11.5–15.5)
WBC: 6.7 10*3/uL (ref 4.0–10.5)
nRBC: 0 % (ref 0.0–0.2)

## 2020-07-13 LAB — COMPREHENSIVE METABOLIC PANEL
ALT: 16 U/L (ref 0–44)
AST: 27 U/L (ref 15–41)
Albumin: 3 g/dL — ABNORMAL LOW (ref 3.5–5.0)
Alkaline Phosphatase: 55 U/L (ref 38–126)
Anion gap: 5 (ref 5–15)
BUN: 19 mg/dL (ref 8–23)
CO2: 26 mmol/L (ref 22–32)
Calcium: 8.7 mg/dL — ABNORMAL LOW (ref 8.9–10.3)
Chloride: 105 mmol/L (ref 98–111)
Creatinine, Ser: 0.84 mg/dL (ref 0.44–1.00)
GFR, Estimated: >60 mL/min (ref >60)
Glucose, Bld: 115 mg/dL — ABNORMAL HIGH (ref 70–99)
Potassium: 4.1 mmol/L (ref 3.5–5.1)
Sodium: 136 mmol/L (ref 135–145)
Total Bilirubin: 0.7 mg/dL (ref 0.3–1.2)
Total Protein: 6.3 g/dL — ABNORMAL LOW (ref 6.5–8.1)

## 2020-07-13 LAB — C-REACTIVE PROTEIN: CRP: 5.2 mg/dL — ABNORMAL HIGH (ref <1.0)

## 2020-07-13 LAB — D-DIMER, QUANTITATIVE: D-Dimer, Quant: 2.15 ug/mL-FEU — ABNORMAL HIGH (ref 0.00–0.50)

## 2020-07-13 LAB — MAGNESIUM: Magnesium: 1.8 mg/dL (ref 1.7–2.4)

## 2020-07-13 LAB — PHOSPHORUS: Phosphorus: 2.9 mg/dL (ref 2.5–4.6)

## 2020-07-13 LAB — FERRITIN: Ferritin: 96 ng/mL (ref 11–307)

## 2020-07-13 MED ORDER — SODIUM CHLORIDE 0.9 % IV SOLN: INTRAVENOUS | Status: DC

## 2020-07-13 NOTE — Progress Notes (Signed)
OT Cancellation Note  Patient Details Name: REJEANA FADNESS MRN: [id number] DOB: 17-Jul-1924   Cancelled Treatment:    Reason Eval/Treat Not Completed: Medical issues which prohibited therapy.  Per chart and PT, pt noted with symptomatic hypotension this date prohibiting therapy. Will hold OT today and return next date as able. Will continue to follow per current POC.   Dessie Coma, M.S. OTR/L  07/13/20, 3:48 PM  ascom [phone]

## 2020-07-13 NOTE — Progress Notes (Signed)
PROGRESS NOTE  [name]  DOB: 1924-07-30  PCP: Kelton Pillar, MD HFW:[id number]  DOA: 07/09/2020  LOS: 4 days  Hospital Day: 5   Chief Complaint  Patient presents with   Fall    Brief narrative: [name] is a 87 y.o. female with PMH significant for hypertension.  Patient presented to the ED on 6/26 with acute cystitis, generalized weakness, fall progressive for last 2 weeks with no focal neurological deficits. In the ED, patient was noted to have COVID 19 PCR test positive Urinalysis consistent with UTI.  Troponin elevated without cardiac symptoms.  Admitted to hospitalist service for further evaluation management.   Subjective: Patient was seen and examined this morning.  Not in distress.  Patient feels lightheaded.  Orthostatic blood pressure drop noted from 150s to 80s per nursing staff.   Assessment/Plan: Acute cystitis/E. coli UTI -Patient presented with a 7-day history of new onset dysuria, urinary urgency, generalized weakness. -Urinalysis consistent with UTI. -Urine cultures with > 100,000 E Coli with sensitivities pending. -Patient with no flank tenderness. -Blood cultures pending with no growth to date. -Currently on oral Keflex.  COVID-19 PCR positive -No evidence of pneumonia in the chest x-ray. -Not requiring supplemental oxygen. -Given 3 days of IV remdesivir.  -Continue bronchodilators, pulm toileting  Orthostatic hypotension -Present orthostatic blood pressure dropped this morning from 150s to 80s.  Patient felt lightheaded. -Start on gentle IV hydration with normal saline at 50 mill per hour. -Stop metoprolol.  Other blood pressure medicines remain on hold as well.  Generalized weakness/falls -Likely multifactorial secondary to UTI, positive COVID-19 infection. -Slowly improving clinically. -PT eval obtained.  Chronic anemia -Hemoglobin stable between 8-9 Recent Labs    07/09/20 1505 07/10/20 0725 07/11/20 0502 07/12/20 0517  07/13/20 0446  HGB 10.1* 8.1* 8.1* 8.3* 9.1*  MCV 101.0* 100.8* 100.9* 100.4* 100.0  VITAMINB12  --   --  1,149*  --   --   FOLATE  --   --  12.6  --   --   FERRITIN 140 137 119 106 96  TIBC  --   --  252  --   --   IRON  --   --  64  --   --    Lower extremity edema -Patient with new onset lower extremity edema R > L. -Edema may be secondary to albuminemia. -Lower extremity Dopplers negative for DVT. -2D echo with EF of 60 to 65%, NWMA, mild LVH, grade 2 diastolic dysfunction, mild to moderate MVR, moderate TVR.   -Patient with no history of liver disease or recent travel.  Elevated troponin -Patient mildly elevated troponin likely secondary to demand ischemia. -Patient asymptomatic, denies any chest pain or significant shortness of breath.   -EKG with nonspecific T wave inversion in aVL otherwise no changes of ischemia noted. -ACS less likely. -2D echo with EF of 60 to 65%, NWMA, mild LVH, grade 2 diastolic dysfunction, mild to moderate MVR, moderate TVR.   -No further cardiac work-up needed at this time.  History of breast cancer in remission -Continue Arimidex.   Mobility: Encourage ambulation Code Status:   Code Status: DNR  Nutritional status: Body mass index is 26.23 kg/m.     Diet Order             Diet regular Room service appropriate? Yes; Fluid consistency: Thin  Diet effective now                   DVT prophylaxis:  enoxaparin (LOVENOX) injection 40 mg Start: 07/11/20 1000 Place and maintain sequential compression device Start: 07/10/20 1726 SCDs Start: 07/09/20 1839   Antimicrobials: IV Rocephin Fluid: None Consultants: None Family Communication: None at bedside  Status is: Inpatient  Remains inpatient appropriate because: Pending placement  Dispo: The patient is from: Home              Anticipated d/c is to: Rehab              Patient currently is medically stable to d/c.   Difficult to place patient No     Infusions:     Scheduled  Meds:  anastrozole  1 mg Oral Daily   vitamin C  500 mg Oral Daily   aspirin EC  81 mg Oral Daily   cephALEXin  500 mg Oral Q8H   enoxaparin (LOVENOX) injection  40 mg Subcutaneous Q24H   Ipratropium-Albuterol  2 puff Inhalation Q6H   loratadine  10 mg Oral Daily   metoprolol tartrate  25 mg Oral Daily   nystatin   Topical BID   pantoprazole  40 mg Oral Q0600   PARoxetine  30 mg Oral Daily   zinc sulfate  220 mg Oral Daily    Antimicrobials: Anti-infectives (From admission, onward)    Start     Dose/Rate Route Frequency Ordered Stop   07/12/20 1400  cephALEXin (KEFLEX) capsule 500 mg        500 mg Oral Every 8 hours 07/12/20 1223     07/10/20 1500  cefTRIAXone (ROCEPHIN) 1 g in sodium chloride 0.9 % 100 mL IVPB  Status:  Discontinued        1 g 200 mL/hr over 30 Minutes Intravenous Every 24 hours 07/09/20 1841 07/12/20 1219   07/10/20 1000  remdesivir 100 mg in sodium chloride 0.9 % 100 mL IVPB       See Hyperspace for full Linked Orders Report.   100 mg 200 mL/hr over 30 Minutes Intravenous Daily 07/09/20 1956 07/11/20 1129   07/09/20 2200  remdesivir 200 mg in sodium chloride 0.9% 250 mL IVPB       See Hyperspace for full Linked Orders Report.   200 mg 580 mL/hr over 30 Minutes Intravenous  Once 07/09/20 1956 07/09/20 2207   07/09/20 1800  cefTRIAXone (ROCEPHIN) 1 g in sodium chloride 0.9 % 100 mL IVPB        1 g 200 mL/hr over 30 Minutes Intravenous  Once 07/09/20 1749 07/09/20 1823       PRN meds: acetaminophen **OR** acetaminophen, albuterol, guaiFENesin-dextromethorphan, hydrALAZINE, metoprolol tartrate   Objective: Vitals:   07/13/20 0841 07/13/20 0844  BP: 136/77 90/68  Pulse: 83 97  Resp:    Temp:    SpO2:      Intake/Output Summary (Last 24 hours) at 07/13/2020 1114 Last data filed at 07/12/2020 2100 Gross per 24 hour  Intake 325.8 ml  Output 1300 ml  Net -974.2 ml    Filed Weights   07/09/20 1513 07/11/20 0600  Weight: 72.5 kg 71.5 kg   Weight  change:  Body mass index is 26.23 kg/m.   Physical Exam: General exam: Pleasant, elderly Caucasian female.  Not in distress Skin: No rashes, lesions or ulcers. HEENT: Atraumatic, normocephalic, no obvious bleeding Lungs: Clear to auscultation bilaterally CVS: Regular rate and rhythm, no murmur GI/Abd soft, nontender, nondistended, bowel sound present CNS: Alert, awake, knows she is in the hospital. Psychiatry: Mood appropriate Extremities: Right trace pedal edema  Data Review:  I have personally reviewed the laboratory data and studies available.  Recent Labs  Lab 07/09/20 1505 07/10/20 0725 07/11/20 0502 07/12/20 0517 07/13/20 0446  WBC 6.9 4.5 5.6 5.8 6.7  NEUTROABS 5.2  --  2.6 2.6 3.5  HGB 10.1* 8.1* 8.1* 8.3* 9.1*  HCT 29.7* 24.3* 23.7* 24.8* 26.2*  MCV 101.0* 100.8* 100.9* 100.4* 100.0  PLT 184 137* 137* 155 202    Recent Labs  Lab 07/09/20 1505 07/09/20 1711 07/10/20 0725 07/11/20 0502 07/12/20 0517 07/13/20 0446  NA 134*  --  135 136 138 136  K 4.1  --  3.8 3.8 3.7 4.1  CL 103  --  105 105 107 105  CO2 22  --  23 24 25 26   GLUCOSE 112*  --  104* 103* 103* 115*  BUN 34*  --  27* 25* 21 19  CREATININE 1.07*  --  1.02* 0.82 0.83 0.84  CALCIUM 8.4*  --  8.0* 8.1* 8.4* 8.7*  MG  --  1.7 2.2 1.8 1.8 1.8  PHOS  --   --  3.0 2.7 2.9 2.9     F/u labs ordered Unresulted Labs (From admission, onward)     Start     Ordered   07/11/20 0500  CBC with Differential/Platelet  Daily,   R     Question:  Specimen collection method  Answer:  Lab=Lab collect   07/10/20 1741   07/11/20 0500  Comprehensive metabolic panel  Daily,   R     Question:  Specimen collection method  Answer:  Lab=Lab collect   07/10/20 1741   07/11/20 0500  C-reactive protein  Daily,   R     Question:  Specimen collection method  Answer:  Lab=Lab collect   07/10/20 1741   07/11/20 0500  D-dimer, quantitative  Daily,   R      07/10/20 1741   07/11/20 0500  Ferritin  Daily,   R     Question:   Specimen collection method  Answer:  Lab=Lab collect   07/10/20 1741   07/11/20 0500  Magnesium  Daily,   R     Question:  Specimen collection method  Answer:  Lab=Lab collect   07/10/20 1741   07/11/20 0500  Phosphorus  Daily,   R     Question:  Specimen collection method  Answer:  Lab=Lab collect   07/10/20 1741            Signed, Terrilee Croak, MD [facility] 07/13/2020

## 2020-07-13 NOTE — TOC Progression Note (Signed)
Transition of Care [facility]) - Progression Note    Patient Details  Name: [name] MRN: [id number] Date of Birth: 1924/08/02  Transition of Care [facility]) CM/SW Contact  Beverly Sessions, RN Phone Number: 07/13/2020, 2:19 PM  Clinical Narrative:     At this time still no bed offer that can accept patient before the 10 day isolation [name] at [facility])   She confirms they offer a bed and can accept patient on 07/20/20.  Daughter [name] has accepted bed.  And I updated in the [place]  Should patient received a bed offer from a facility that can take her before the 10 days, [name] states she intends to file an appeal of the discharge  Expected Discharge Plan: [place] Barriers to Discharge: Continued Medical Work up  Expected Discharge Plan and Services Expected Discharge Plan: [place]   Discharge Planning Services: CM Consult   Living arrangements for the past 2 months: Single Family Home                                       Social Determinants of Health (SDOH) Interventions    Readmission Risk Interventions No flowsheet data found.

## 2020-07-13 NOTE — Progress Notes (Signed)
PT Cancellation Note  Patient Details Name: [name] MRN: [id number] DOB: 01-17-1924   Cancelled Treatment:     Rn informed Pryor Curia that pt has been having hypotension with symptoms. Will hold PT today and return tomorrow. Will continue to follow per current POC.    Willette Pa 07/13/2020, 2:34 PM

## 2020-07-13 NOTE — Progress Notes (Signed)
   07/13/20 0853  Vitals  Patient Position (if appropriate) Orthostatic Vitals  Orthostatic Lying   BP- Lying 157/77  Pulse- Lying 79  Orthostatic Sitting  BP- Sitting 136/77  Pulse- Sitting 83  Orthostatic Standing at 0 minutes  BP- Standing at 0 minutes 90/68  Pulse- Standing at 0 minutes 97  Orthostatic Standing at 3 minutes  BP- Standing at 3 minutes (!) 86/62  Pulse- Standing at 3 minutes 119   Patient c/o dizziness when transferring from the bed to the chair. Orthostatic v/s taken. MD made aware. PO metoprolol held.

## 2020-07-14 LAB — CBC WITH DIFFERENTIAL/PLATELET
Abs Immature Granulocytes: 0.03 10*3/uL (ref 0.00–0.07)
Basophils Absolute: 0 10*3/uL (ref 0.0–0.1)
Basophils Relative: 0 %
Eosinophils Absolute: 0.4 10*3/uL (ref 0.0–0.5)
Eosinophils Relative: 6 %
HCT: 24.7 % — ABNORMAL LOW (ref 36.0–46.0)
Hemoglobin: 8.3 g/dL — ABNORMAL LOW (ref 12.0–15.0)
Immature Granulocytes: 1 %
Lymphocytes Relative: 29 %
Lymphs Abs: 1.9 10*3/uL (ref 0.7–4.0)
MCH: 32.9 pg (ref 26.0–34.0)
MCHC: 33.6 g/dL (ref 30.0–36.0)
MCV: 98 fL (ref 80.0–100.0)
Monocytes Absolute: 0.5 10*3/uL (ref 0.1–1.0)
Monocytes Relative: 8 %
Neutro Abs: 3.6 10*3/uL (ref 1.7–7.7)
Neutrophils Relative %: 56 %
Platelets: 226 10*3/uL (ref 150–400)
RBC: 2.52 MIL/uL — ABNORMAL LOW (ref 3.87–5.11)
RDW: 11.8 % (ref 11.5–15.5)
WBC: 6.5 10*3/uL (ref 4.0–10.5)
nRBC: 0 % (ref 0.0–0.2)

## 2020-07-14 LAB — COMPREHENSIVE METABOLIC PANEL
ALT: 16 U/L (ref 0–44)
AST: 23 U/L (ref 15–41)
Albumin: 2.8 g/dL — ABNORMAL LOW (ref 3.5–5.0)
Alkaline Phosphatase: 50 U/L (ref 38–126)
Anion gap: 5 (ref 5–15)
BUN: 20 mg/dL (ref 8–23)
CO2: 25 mmol/L (ref 22–32)
Calcium: 8.7 mg/dL — ABNORMAL LOW (ref 8.9–10.3)
Chloride: 108 mmol/L (ref 98–111)
Creatinine, Ser: 0.82 mg/dL (ref 0.44–1.00)
GFR, Estimated: >60 mL/min (ref >60)
Glucose, Bld: 110 mg/dL — ABNORMAL HIGH (ref 70–99)
Potassium: 3.9 mmol/L (ref 3.5–5.1)
Sodium: 138 mmol/L (ref 135–145)
Total Bilirubin: 0.8 mg/dL (ref 0.3–1.2)
Total Protein: 6.1 g/dL — ABNORMAL LOW (ref 6.5–8.1)

## 2020-07-14 LAB — CULTURE, BLOOD (ROUTINE X 2)
Culture: NO GROWTH
Culture: NO GROWTH
Special Requests: ADEQUATE

## 2020-07-14 LAB — C-REACTIVE PROTEIN: CRP: 2.5 mg/dL — ABNORMAL HIGH (ref <1.0)

## 2020-07-14 LAB — MAGNESIUM: Magnesium: 1.7 mg/dL (ref 1.7–2.4)

## 2020-07-14 LAB — D-DIMER, QUANTITATIVE: D-Dimer, Quant: 2.11 ug/mL-FEU — ABNORMAL HIGH (ref 0.00–0.50)

## 2020-07-14 LAB — GLUCOSE, CAPILLARY: Glucose-Capillary: 110 mg/dL — ABNORMAL HIGH (ref 70–99)

## 2020-07-14 LAB — PHOSPHORUS: Phosphorus: 3.3 mg/dL (ref 2.5–4.6)

## 2020-07-14 LAB — FERRITIN: Ferritin: 81 ng/mL (ref 11–307)

## 2020-07-14 MED ORDER — CLONAZEPAM 0.5 MG PO TABS: 0.5000 mg | ORAL_TABLET | Freq: Two times a day (BID) | ORAL | Status: DC

## 2020-07-14 MED ORDER — CLONAZEPAM 0.5 MG PO TABS
0.5000 mg | ORAL_TABLET | Freq: Two times a day (BID) | ORAL | Status: DC
  Administered 2020-07-14 – 2020-07-20 (×12): 0.5 mg via ORAL
  Filled 2020-07-14 (×12): qty 1

## 2020-07-14 NOTE — TOC Progression Note (Signed)
Transition of Care [facility]) - Progression Note    Patient Details  Name: [name] MRN: [id number] Date of Birth: 1924-02-08  Transition of Care [facility]) CM/SW [place], LCSW Phone Number: 07/14/2020, 1:23 PM  Clinical Narrative:   Patient still with no new bed offers.    Expected Discharge Plan: [place] Barriers to Discharge: Continued Medical Work up  Expected Discharge Plan and Services Expected Discharge Plan: [place]   Discharge Planning Services: CM Consult   Living arrangements for the past 2 months: Single Family Home                                       Social Determinants of Health (SDOH) Interventions    Readmission Risk Interventions No flowsheet data found.

## 2020-07-14 NOTE — Progress Notes (Signed)
   07/14/20 1941  Assess: MEWS Score  Temp 98.2 F (36.8 C)  BP (!) 152/82  Pulse Rate (!) 117  Resp 20  Level of Consciousness Alert  SpO2 96 %  O2 Device Room Air  Assess: MEWS Score  MEWS Temp 0  MEWS Systolic 0  MEWS Pulse 2  MEWS RR 0  MEWS LOC 0  MEWS Score 2  MEWS Score Color Yellow  Assess: if the MEWS score is Yellow or Red  Were vital signs taken at a resting state? Yes  Focused Assessment No change from prior assessment  Does the patient meet 2 or more of the SIRS criteria? No  Does the patient have a confirmed or suspected source of infection? Yes (pt has covid)  Provider and Rapid Response Notified? No  MEWS guidelines implemented *See Row Information* Yes  Treat  MEWS Interventions Administered scheduled meds/treatments  Pain Scale 0-10  Pain Score 0  Take Vital Signs  Increase Vital Sign Frequency  Yellow: Q 2hr X 2 then Q 4hr X 2, if remains yellow, continue Q 4hrs  Escalate  MEWS: Escalate Yellow: discuss with charge nurse/RN and consider discussing with provider and RRT  Notify: Charge Nurse/RN  Name of Charge Nurse/RN Notified Almyra Free RN  Date Charge Nurse/RN Notified 07/14/20  Time Charge Nurse/RN Notified 1953  Assess: SIRS CRITERIA  SIRS Temperature  0  SIRS Pulse 1  SIRS Respirations  0  SIRS WBC 0  SIRS Score Sum  1   Pt anxious and wants to go home. Will give her scheduled clonazepam. HR at 110s-120s. Will cont to monitor her v/s.

## 2020-07-14 NOTE — Progress Notes (Signed)
PROGRESS NOTE  [name]  DOB: August 29, 1924  PCP: Brianna Pillar, MD URK:[id number]  DOA: 07/09/2020  LOS: 5 days  Hospital Day: 6   Chief Complaint  Patient presents with   Fall    Brief narrative: [name] is a 87 y.o. female with PMH significant for hypertension.  Patient presented to the ED on 6/26 with acute cystitis, [name], generalized weakness, progressive for last 2 weeks with no focal neurological deficits. In the ED, patient was noted to have COVID 19 PCR test positive Urinalysis consistent with UTI.  Troponin elevated without cardiac symptoms.  Admitted to hospitalist service for further evaluation management.   Subjective: Patient was seen and examined this morning.  Lying down in bed.  Feels uncomfortable. Blood pressure is running high this morning.  Assessment/Plan: Acute cystitis/E. coli UTI -Patient presented with a 7-day history of new onset dysuria, urinary urgency, generalized weakness. -Urinalysis consistent with UTI. -Urine cultures with > 100,000 E Coli with sensitivities pending. -Patient with no flank tenderness. -Blood cultures pending with no growth to date. -Currently on oral Keflex.  COVID-19 PCR positive -No evidence of pneumonia in the chest x-ray. -Not requiring supplemental oxygen. -Given 3 days of IV remdesivir.  -Continue bronchodilators, pulm toileting  Orthostatic hypotension History of essential hypertension -Patient had orthostatic hypotension on 6/30 with lightheadedness.  IV fluid given for 24 hours.  Metoprolol held. -Stop fluid today.  Blood pressures running elevated today.  Continue to monitor. -Tentative plan to resume blood pressure medicines tomorrow  Generalized weakness/falls -Likely multifactorial secondary to UTI, positive COVID-19 infection. -Slowly improving clinically. -PT eval obtained. -Left hip area bruising is probably related to fall.  Chronic anemia -Hemoglobin stable between 8-9 Recent  Labs    07/10/20 0725 07/11/20 0502 07/12/20 0517 07/13/20 0446 07/14/20 0554  HGB 8.1* 8.1* 8.3* 9.1* 8.3*  MCV 100.8* 100.9* 100.4* 100.0 98.0  VITAMINB12  --  1,149*  --   --   --   FOLATE  --  12.6  --   --   --   FERRITIN 137 119 106 96 81  TIBC  --  252  --   --   --   IRON  --  64  --   --   --    Elevated troponin -Patient mildly elevated troponin likely secondary to demand ischemia. -Patient asymptomatic, denies any chest pain or significant shortness of breath.   -EKG with nonspecific T wave inversion in aVL otherwise no changes of ischemia noted. -2D echo with EF of 60 to 65%, NWMA, mild LVH, grade 2 diastolic dysfunction, mild to moderate MVR, moderate TVR.   -No further cardiac work-up needed at this time.  History of breast cancer in remission -Continue Arimidex.   Mobility: Encourage ambulation Code Status:   Code Status: DNR  Nutritional status: Body mass index is 26.23 kg/m.     Diet Order             Diet regular Room service appropriate? Yes; Fluid consistency: Thin  Diet effective now                   DVT prophylaxis:  enoxaparin (LOVENOX) injection 40 mg Start: 07/11/20 1000 Place and maintain sequential compression device Start: 07/10/20 1726 SCDs Start: 07/09/20 1839   Antimicrobials: Oral Keflex. Fluid: None Consultants: None Family Communication: None at bedside  Status is: Inpatient  Remains inpatient appropriate because: Pending placement  Dispo: The patient is from: Home  Anticipated d/c is to: Nursing facility when available              Patient currently is medically stable to d/c.   Difficult to place patient No     Infusions:      Scheduled Meds:  anastrozole  1 mg Oral Daily   vitamin C  500 mg Oral Daily   aspirin EC  81 mg Oral Daily   cephALEXin  500 mg Oral Q8H   enoxaparin (LOVENOX) injection  40 mg Subcutaneous Q24H   Ipratropium-Albuterol  2 puff Inhalation Q6H   loratadine  10 mg Oral  Daily   nystatin   Topical BID   pantoprazole  40 mg Oral Q0600   PARoxetine  30 mg Oral Daily   zinc sulfate  220 mg Oral Daily    Antimicrobials: Anti-infectives (From admission, onward)    Start     Dose/Rate Route Frequency Ordered Stop   07/12/20 1400  cephALEXin (KEFLEX) capsule 500 mg        500 mg Oral Every 8 hours 07/12/20 1223     07/10/20 1500  cefTRIAXone (ROCEPHIN) 1 g in sodium chloride 0.9 % 100 mL IVPB  Status:  Discontinued        1 g 200 mL/hr over 30 Minutes Intravenous Every 24 hours 07/09/20 1841 07/12/20 1219   07/10/20 1000  remdesivir 100 mg in sodium chloride 0.9 % 100 mL IVPB       See Hyperspace for full Linked Orders Report.   100 mg 200 mL/hr over 30 Minutes Intravenous Daily 07/09/20 1956 07/11/20 1129   07/09/20 2200  remdesivir 200 mg in sodium chloride 0.9% 250 mL IVPB       See Hyperspace for full Linked Orders Report.   200 mg 580 mL/hr over 30 Minutes Intravenous  Once 07/09/20 1956 07/09/20 2207   07/09/20 1800  cefTRIAXone (ROCEPHIN) 1 g in sodium chloride 0.9 % 100 mL IVPB        1 g 200 mL/hr over 30 Minutes Intravenous  Once 07/09/20 1749 07/09/20 1823       PRN meds: acetaminophen **OR** acetaminophen, albuterol, guaiFENesin-dextromethorphan, hydrALAZINE   Objective: Vitals:   07/14/20 0844 07/14/20 1134  BP: (!) 187/92 (!) 182/91  Pulse: 92 93  Resp: 20   Temp: 98.4 F (36.9 C)   SpO2: 95%     Intake/Output Summary (Last 24 hours) at 07/14/2020 1207 Last data filed at 07/14/2020 8338 Gross per 24 hour  Intake 726.41 ml  Output 1600 ml  Net -873.59 ml   Filed Weights   07/09/20 1513 07/11/20 0600  Weight: 72.5 kg 71.5 kg   Weight change:  Body mass index is 26.23 kg/m.   Physical Exam: General exam: Pleasant, elderly Caucasian female.  Not in distress Skin: No rashes, lesions or ulcers. HEENT: Atraumatic, normocephalic, no obvious bleeding Lungs: Clear to auscultation bilaterally CVS: Regular rate and rhythm, no  murmur GI/Abd soft, nontender, nondistended, bowel sound present CNS: Alert, awake, knows she is in the hospital. Psychiatry: Seems anxious today Extremities: Right trace pedal edema.  Left hip area with extensive bruising  Data Review: I have personally reviewed the laboratory data and studies available.  Recent Labs  Lab 07/09/20 1505 07/10/20 0725 07/11/20 0502 07/12/20 0517 07/13/20 0446 07/14/20 0554  WBC 6.9 4.5 5.6 5.8 6.7 6.5  NEUTROABS 5.2  --  2.6 2.6 3.5 3.6  HGB 10.1* 8.1* 8.1* 8.3* 9.1* 8.3*  HCT 29.7* 24.3* 23.7* 24.8* 26.2* 24.7*  MCV 101.0* 100.8* 100.9* 100.4* 100.0 98.0  PLT 184 137* 137* 155 202 226   Recent Labs  Lab 07/10/20 0725 07/11/20 0502 07/12/20 0517 07/13/20 0446 07/14/20 0554  NA 135 136 138 136 138  K 3.8 3.8 3.7 4.1 3.9  CL 105 105 107 105 108  CO2 23 24 25 26 25   GLUCOSE 104* 103* 103* 115* 110*  BUN 27* 25* 21 19 20   CREATININE 1.02* 0.82 0.83 0.84 0.82  CALCIUM 8.0* 8.1* 8.4* 8.7* 8.7*  MG 2.2 1.8 1.8 1.8 1.7  PHOS 3.0 2.7 2.9 2.9 3.3    F/u labs ordered Unresulted Labs (From admission, onward)     Start     Ordered   07/11/20 0500  CBC with Differential/Platelet  Daily,   R     Question:  Specimen collection method  Answer:  Lab=Lab collect   07/10/20 1741   07/11/20 0500  Comprehensive metabolic panel  Daily,   R     Question:  Specimen collection method  Answer:  Lab=Lab collect   07/10/20 1741   07/11/20 0500  C-reactive protein  Daily,   R     Question:  Specimen collection method  Answer:  Lab=Lab collect   07/10/20 1741   07/11/20 0500  D-dimer, quantitative  Daily,   R      07/10/20 1741   07/11/20 0500  Ferritin  Daily,   R     Question:  Specimen collection method  Answer:  Lab=Lab collect   07/10/20 1741   07/11/20 0500  Magnesium  Daily,   R     Question:  Specimen collection method  Answer:  Lab=Lab collect   07/10/20 1741   07/11/20 0500  Phosphorus  Daily,   R     Question:  Specimen collection method   Answer:  Lab=Lab collect   07/10/20 1741            Signed, Terrilee Croak, MD [facility] 07/14/2020

## 2020-07-14 NOTE — Progress Notes (Signed)
Occupational Therapy Treatment Patient Details Name: [name] MRN: [id number] DOB: 04/12/1924 Today's Date: 07/14/2020    History of present illness Pt is a 87 y/o F admitted on 07/09/20 with c/c of generalized weakness. Pt being treated for acute cystitis. Pt found to be COVID (+). Pt endorses 2 falls 2/2 tripping over the last 10 days. PMH: HTN, R knee arthritis, carotid artery occlusion, depression, HLD, mini stroke (July 2005)   OT comments  Ms. Benard was seen for OT treatment on this date. Upon arrival to room pt awake/alert, semi-supine in bed with arms tangled in tele lines/leads. Pt remains generally confused but pleasant t/o session. She states "where have you been" when she sees this author enter the room. Pt is easily redirected and agreeable to OT tx session. Pt continues to require MAX A for ADL t/f. SETUP for seated grooming tasks at EOB. Pt with limited activity tolerance is only able to sit EOB for ~5 min, but is able to use RUE to wash face without physical assist. She performs oral care at bed level once back to upright position in bed. VS monitored t/o session. HR briefly up to 130 with functional activity. RN notified/aware. SO2 stable and WFL >/=90%. Pt making progressing toward OT goals and continues to benefit from skilled OT services to maximize return to PLOF and minimize risk of future falls, injury, caregiver burden, and readmission. Will continue to follow POC. Discharge recommendation remains appropriate.    Follow Up Recommendations  SNF    Equipment Recommendations  3 in 1 bedside commode    Recommendations for Other Services      Precautions / Restrictions Precautions Precautions: Fall Restrictions Weight Bearing Restrictions: No       Mobility Bed Mobility Overal bed mobility: Needs Assistance Bed Mobility: Supine to Sit;Sit to Supine     Supine to sit: HOB elevated;Mod assist;Max assist Sit to supine: Mod assist;HOB elevated   General bed  mobility comments: Mod-max of one to achieve EOB short sit. Increased time however pt puts forth good effort throughout. Denies dizziness with positional changes this date.    Transfers                 General transfer comment: Deferred. Pt fatigues seated at EOB. Unsafe to attempt.    Balance Overall balance assessment: Needs assistance Sitting-balance support: Feet supported;Bilateral upper extremity supported;Single extremity supported Sitting balance-Leahy Scale: Fair Sitting balance - Comments: Steady static sitting at EOB for ~5 minutes during session.                                   ADL either performed or assessed with clinical judgement   ADL Overall ADL's : Needs assistance/impaired                                       General ADL Comments: Pt continues to require MAX A for ADL t/f. SETUP for seated grooming tasks at EOB. Pt with limited activity tolerance is only able to sit EOB for ~5 min, but is able to use RUE to wash face without physical assist. She performs oral care at bed level once back to upright position in bed. VS monitored t/o session. HR briefly up to 130 with functional activity. RN notified/aware. SO2 stable and WFL >/=90%.     Vision  Baseline Vision/History: Wears glasses Patient Visual Report: No change from baseline     Perception     Praxis      Cognition Arousal/Alertness: Awake/alert Behavior During Therapy: WFL for tasks assessed/performed Overall Cognitive Status: No family/caregiver present to determine baseline cognitive functioning                                 General Comments: Pt was A and O x 2. Cooperative and pleasant throughout. Mildly confused and tearful during session.        Exercises Other Exercises Other Exercises: OT facilitates bed mobility (sup<>sit), seated grooming task, bed level grooming, education on falls prevention strategies for home and hospital.    Shoulder Instructions       General Comments Pt on RA during session. HR reaches 120's-130's during functional activity SO2 >/= 90% during session.    Pertinent Vitals/ Pain       Pain Assessment: No/denies pain  Home Living                                          Prior Functioning/Environment              Frequency  Min 1X/week        Progress Toward Goals  OT Goals(current goals can now be found in the care plan section)  Progress towards OT goals: Progressing toward goals  Acute Rehab OT Goals Patient Stated Goal: feel better OT Goal Formulation: With patient Time For Goal Achievement: 07/24/20 Potential to Achieve Goals: Good  Plan Discharge plan remains appropriate;Frequency remains appropriate    Co-evaluation                 AM-PAC OT "6 Clicks" Daily Activity     Outcome Measure   Help from another person eating meals?: None Help from another person taking care of personal grooming?: A Little Help from another person toileting, which includes using toliet, bedpan, or urinal?: A Lot Help from another person bathing (including washing, rinsing, drying)?: A Lot Help from another person to put on and taking off regular upper body clothing?: A Little Help from another person to put on and taking off regular lower body clothing?: A Lot 6 Click Score: 16    End of Session    OT Visit Diagnosis: Unsteadiness on feet (R26.81);Muscle weakness (generalized) (M62.81)   Activity Tolerance Patient tolerated treatment well   Patient Left in bed;with call bell/phone within reach;with bed alarm set   Nurse Communication          Time: 1505-6979 OT Time Calculation (min): 36 min  Charges: OT General Charges $OT Visit: 1 Visit OT Treatments $Self Care/Home Management : 23-37 mins  Shara Blazing, M.S., OTR/L Ascom: [phone] 07/14/20, 4:15 PM

## 2020-07-15 LAB — COMPREHENSIVE METABOLIC PANEL
ALT: 16 U/L (ref 0–44)
AST: 27 U/L (ref 15–41)
Albumin: 2.9 g/dL — ABNORMAL LOW (ref 3.5–5.0)
Alkaline Phosphatase: 62 U/L (ref 38–126)
Anion gap: 5 (ref 5–15)
BUN: 19 mg/dL (ref 8–23)
CO2: 26 mmol/L (ref 22–32)
Calcium: 8.2 mg/dL — ABNORMAL LOW (ref 8.9–10.3)
Chloride: 103 mmol/L (ref 98–111)
Creatinine, Ser: 1.14 mg/dL — ABNORMAL HIGH (ref 0.44–1.00)
GFR, Estimated: 45 mL/min — ABNORMAL LOW (ref >60)
Glucose, Bld: 105 mg/dL — ABNORMAL HIGH (ref 70–99)
Potassium: 4 mmol/L (ref 3.5–5.1)
Sodium: 134 mmol/L — ABNORMAL LOW (ref 135–145)
Total Bilirubin: 0.9 mg/dL (ref 0.3–1.2)
Total Protein: 6 g/dL — ABNORMAL LOW (ref 6.5–8.1)

## 2020-07-15 LAB — CBC WITH DIFFERENTIAL/PLATELET
Abs Immature Granulocytes: 0.09 10*3/uL — ABNORMAL HIGH (ref 0.00–0.07)
Basophils Absolute: 0 10*3/uL (ref 0.0–0.1)
Basophils Relative: 0 %
Eosinophils Absolute: 0.4 10*3/uL (ref 0.0–0.5)
Eosinophils Relative: 6 %
HCT: 25.1 % — ABNORMAL LOW (ref 36.0–46.0)
Hemoglobin: 8.7 g/dL — ABNORMAL LOW (ref 12.0–15.0)
Immature Granulocytes: 1 %
Lymphocytes Relative: 23 %
Lymphs Abs: 1.7 10*3/uL (ref 0.7–4.0)
MCH: 34.4 pg — ABNORMAL HIGH (ref 26.0–34.0)
MCHC: 34.7 g/dL (ref 30.0–36.0)
MCV: 99.2 fL (ref 80.0–100.0)
Monocytes Absolute: 0.6 10*3/uL (ref 0.1–1.0)
Monocytes Relative: 8 %
Neutro Abs: 4.6 10*3/uL (ref 1.7–7.7)
Neutrophils Relative %: 62 %
Platelets: 255 10*3/uL (ref 150–400)
RBC: 2.53 MIL/uL — ABNORMAL LOW (ref 3.87–5.11)
RDW: 12.1 % (ref 11.5–15.5)
WBC: 7.5 10*3/uL (ref 4.0–10.5)
nRBC: 0 % (ref 0.0–0.2)

## 2020-07-15 LAB — D-DIMER, QUANTITATIVE: D-Dimer, Quant: 2.48 ug/mL-FEU — ABNORMAL HIGH (ref 0.00–0.50)

## 2020-07-15 LAB — C-REACTIVE PROTEIN: CRP: 2 mg/dL — ABNORMAL HIGH (ref <1.0)

## 2020-07-15 LAB — FERRITIN: Ferritin: 81 ng/mL (ref 11–307)

## 2020-07-15 LAB — PHOSPHORUS: Phosphorus: 3.7 mg/dL (ref 2.5–4.6)

## 2020-07-15 LAB — MAGNESIUM: Magnesium: 1.7 mg/dL (ref 1.7–2.4)

## 2020-07-15 MED ORDER — METOPROLOL TARTRATE 25 MG PO TABS
25.0000 mg | ORAL_TABLET | Freq: Two times a day (BID) | ORAL | Status: DC
  Administered 2020-07-15 – 2020-07-20 (×12): 25 mg via ORAL
  Filled 2020-07-15 (×12): qty 1

## 2020-07-15 MED ORDER — TRAZODONE HCL 50 MG PO TABS
25.0000 mg | ORAL_TABLET | Freq: Every evening | ORAL | Status: DC | PRN
  Administered 2020-07-15 – 2020-07-17 (×4): 25 mg via ORAL
  Filled 2020-07-15 (×4): qty 1

## 2020-07-15 NOTE — Progress Notes (Signed)
Pt HR still running 110s-120s, Bp-139/90. Anxious and unable to sleep. Oncall provider informed, metoprolol restarted and trazodone given.

## 2020-07-15 NOTE — TOC Progression Note (Signed)
Transition of Care [facility]) - Progression Note    Patient Details  Name: SHEYLA ZAFFINO MRN: [id number] Date of Birth: [birth year]  Transition of Care [facility]) CM/SW [place], LCSW Phone Number: 07/15/2020, 8:55 AM  Clinical Narrative:   Patient still with no new bed offers.    Expected Discharge Plan: [place] Barriers to Discharge: Continued Medical Work up  Expected Discharge Plan and Services Expected Discharge Plan: [place]   Discharge Planning Services: CM Consult   Living arrangements for the past 2 months: Single Family Home                                       Social Determinants of Health (SDOH) Interventions    Readmission Risk Interventions No flowsheet data found.

## 2020-07-15 NOTE — Progress Notes (Signed)
PROGRESS NOTE  [name]  DOB: 05-06-1924  PCP: Kelton Pillar, MD GNO:[id number]  DOA: 07/09/2020  LOS: 6 days  Hospital Day: 7   Chief Complaint  Patient presents with   Fall    Brief narrative: [name] is a 87 y.o. female with PMH significant for hypertension.  Patient presented to the ED on 6/26 with acute cystitis, Giannamarie Paulus, generalized weakness, progressive for last 2 weeks with no focal neurological deficits. In the ED, patient was noted to have COVID 19 PCR test positive Urinalysis consistent with UTI.  Troponin elevated without cardiac symptoms.  Admitted to hospitalist service for further evaluation management.   Subjective: Patient was seen and examined this morning.  Lying on recliner.  Not in distress.  No new symptoms. Noted that she was tachycardic and hypertensive last night.  Metoprolol was resumed after which heart rate and blood pressure improved.  Assessment/Plan: Acute cystitis/E. coli UTI -Patient presented with a 7-day history of new onset dysuria, urinary urgency, generalized weakness. -Urinalysis consistent with UTI. -Urine cultures with > 100,000 E Coli -Patient with no flank tenderness. -Blood cultures pending with no growth to date. -Currently on oral Keflex for a 7-day course of antibiotics.  COVID-19 PCR positive -No evidence of pneumonia in the chest x-ray. -Not requiring supplemental oxygen. -Given 3 days of IV remdesivir.  -Continue bronchodilators, pulm toileting  Orthostatic hypotension History of essential hypertension -Patient had orthostatic hypotension on 6/30 with lightheadedness.  IV fluid given for 24 hours.  Metoprolol held. -She was adequately hydrated.  IV fluid stopped.  Metoprolol resumed.  Blood pressure stable at this time.  Generalized weakness/falls -Likely multifactorial secondary to UTI, positive COVID-19 infection. -Slowly improving clinically. -PT eval obtained. -Left hip area bruising is probably  related to fall.  Chronic anemia -Hemoglobin stable between 8-9 Recent Labs    07/11/20 0502 07/12/20 0517 07/13/20 0446 07/14/20 0554 07/15/20 0440  HGB 8.1* 8.3* 9.1* 8.3* 8.7*  MCV 100.9* 100.4* 100.0 98.0 99.2  VITAMINB12 1,149*  --   --   --   --   FOLATE 12.6  --   --   --   --   FERRITIN 119 106 96 81 81  TIBC 252  --   --   --   --   IRON 64  --   --   --   --    Elevated troponin -Patient mildly elevated troponin likely secondary to demand ischemia. -Patient asymptomatic, denies any chest pain or significant shortness of breath.   -EKG with nonspecific T wave inversion in aVL otherwise no changes of ischemia noted. -2D echo with EF of 60 to 65%, NWMA, mild LVH, grade 2 diastolic dysfunction, mild to moderate MVR, moderate TVR.   -No further cardiac work-up needed at this time.  History of breast cancer in remission -Continue Arimidex.   Mobility: Encourage ambulation Code Status:   Code Status: DNR  Nutritional status: Body mass index is 26.23 kg/m.     Diet Order             Diet regular Room service appropriate? Yes; Fluid consistency: Thin  Diet effective now                   DVT prophylaxis:  enoxaparin (LOVENOX) injection 40 mg Start: 07/11/20 1000 Place and maintain sequential compression device Start: 07/10/20 1726 SCDs Start: 07/09/20 1839   Antimicrobials: Oral Keflex. Fluid: None Consultants: None Family Communication: None at bedside  Status is: Inpatient  Remains inpatient appropriate because: Pending placement  Dispo: The patient is from: Home              Anticipated d/c is to: Nursing facility when available              Patient currently is medically stable to d/c.   Difficult to place patient No     Infusions:      Scheduled Meds:  anastrozole  1 mg Oral Daily   vitamin C  500 mg Oral Daily   aspirin EC  81 mg Oral Daily   cephALEXin  500 mg Oral Q8H   clonazePAM  0.5 mg Oral BID   enoxaparin (LOVENOX)  injection  40 mg Subcutaneous Q24H   Ipratropium-Albuterol  2 puff Inhalation Q6H   loratadine  10 mg Oral Daily   metoprolol tartrate  25 mg Oral BID   nystatin   Topical BID   pantoprazole  40 mg Oral Q0600   PARoxetine  30 mg Oral Daily   zinc sulfate  220 mg Oral Daily    Antimicrobials: Anti-infectives (From admission, onward)    Start     Dose/Rate Route Frequency Ordered Stop   07/12/20 1400  cephALEXin (KEFLEX) capsule 500 mg        500 mg Oral Every 8 hours 07/12/20 1223     07/10/20 1500  cefTRIAXone (ROCEPHIN) 1 g in sodium chloride 0.9 % 100 mL IVPB  Status:  Discontinued        1 g 200 mL/hr over 30 Minutes Intravenous Every 24 hours 07/09/20 1841 07/12/20 1219   07/10/20 1000  remdesivir 100 mg in sodium chloride 0.9 % 100 mL IVPB       See Hyperspace for full Linked Orders Report.   100 mg 200 mL/hr over 30 Minutes Intravenous Daily 07/09/20 1956 07/11/20 1129   07/09/20 2200  remdesivir 200 mg in sodium chloride 0.9% 250 mL IVPB       See Hyperspace for full Linked Orders Report.   200 mg 580 mL/hr over 30 Minutes Intravenous  Once 07/09/20 1956 07/09/20 2207   07/09/20 1800  cefTRIAXone (ROCEPHIN) 1 g in sodium chloride 0.9 % 100 mL IVPB        1 g 200 mL/hr over 30 Minutes Intravenous  Once 07/09/20 1749 07/09/20 1823       PRN meds: acetaminophen **OR** acetaminophen, albuterol, guaiFENesin-dextromethorphan, hydrALAZINE, traZODone   Objective: Vitals:   07/15/20 0344 07/15/20 0800  BP: 118/67 127/73  Pulse: 74 87  Resp: 20 20  Temp: 99 F (37.2 C) 98.3 F (36.8 C)  SpO2: 94% 95%    Intake/Output Summary (Last 24 hours) at 07/15/2020 1220 Last data filed at 07/14/2020 1800 Gross per 24 hour  Intake 305.87 ml  Output 800 ml  Net -494.13 ml    Filed Weights   07/09/20 1513 07/11/20 0600  Weight: 72.5 kg 71.5 kg   Weight change:  Body mass index is 26.23 kg/m.   Physical Exam: General exam: Pleasant, elderly Caucasian female.  Not in  physical distress Skin: No rashes, lesions or ulcers. HEENT: Atraumatic, normocephalic, no obvious bleeding Lungs: Clear to auscultation bilaterally CVS: Regular rate and rhythm, no murmur GI/Abd soft, nontender, nondistended, bowel sound present CNS: Alert, awake, knows she is in the hospital. Psychiatry: Seems anxious today Extremities: Right trace pedal edema.  Left hip area with extensive bruising  Data Review: I have personally reviewed the laboratory data and studies available.  Recent Labs  Lab 07/11/20 0502 07/12/20 0517 07/13/20 0446 07/14/20 0554 07/15/20 0440  WBC 5.6 5.8 6.7 6.5 7.5  NEUTROABS 2.6 2.6 3.5 3.6 4.6  HGB 8.1* 8.3* 9.1* 8.3* 8.7*  HCT 23.7* 24.8* 26.2* 24.7* 25.1*  MCV 100.9* 100.4* 100.0 98.0 99.2  PLT 137* 155 202 226 255    Recent Labs  Lab 07/11/20 0502 07/12/20 0517 07/13/20 0446 07/14/20 0554 07/15/20 0440  NA 136 138 136 138 134*  K 3.8 3.7 4.1 3.9 4.0  CL 105 107 105 108 103  CO2 24 25 26 25 26   GLUCOSE 103* 103* 115* 110* 105*  BUN 25* 21 19 20 19   CREATININE 0.82 0.83 0.84 0.82 1.14*  CALCIUM 8.1* 8.4* 8.7* 8.7* 8.2*  MG 1.8 1.8 1.8 1.7 1.7  PHOS 2.7 2.9 2.9 3.3 3.7     F/u labs ordered Unresulted Labs (From admission, onward)    None       Signed, Terrilee Croak, MD [facility] 07/15/2020

## 2020-07-16 MED ORDER — POLYETHYLENE GLYCOL 3350 17 G PO PACK: 17.0000 g | PACK | Freq: Every day | ORAL | Status: DC | PRN

## 2020-07-16 NOTE — Plan of Care (Signed)
  Problem: Clinical Measurements: Goal: Ability to maintain clinical measurements within normal limits will improve Outcome: Progressing Goal: Will remain free from infection Outcome: Progressing Goal: Diagnostic test results will improve Outcome: Progressing Goal: Respiratory complications will improve Outcome: Progressing Goal: Cardiovascular complication will be avoided Outcome: Progressing   Problem: Pain Managment: Goal: General experience of comfort will improve Outcome: Progressing   Pt calm and cooperative. V/S stable. Denies any pain or SOB. Oxygen saturation at 95% on RA. Slept well all night.

## 2020-07-16 NOTE — Progress Notes (Signed)
PROGRESS NOTE  [name]  DOB: 01/15/1924  PCP: Kelton Pillar, MD TXM:[id number]  DOA: 07/09/2020  LOS: 7 days  Hospital Day: 8   Chief Complaint  Patient presents with   Fall    Brief narrative: [name] is a 87 y.o. female with PMH significant for hypertension.  Patient presented to the ED on 6/26 with acute cystitis, Onnika Siebel, generalized weakness, progressive for last 2 weeks with no focal neurological deficits. In the ED, patient was noted to have COVID 19 PCR test positive Urinalysis consistent with UTI.  Troponin elevated without cardiac symptoms.  Admitted to hospitalist service for further evaluation management.   Subjective: Patient was seen and examined this morning.  Lying on bed.  Not in distress.  No new symptoms.  Hemodynamically stable  Assessment/Plan: Acute cystitis/E. coli UTI -Patient presented with a 7-day history of new onset dysuria, urinary urgency, generalized weakness. -Urinalysis consistent with UTI. -Urine cultures with > 100,000 E Coli -Patient with no flank tenderness. -Blood cultures pending with no growth to date. -Completed 7-day course of oral Keflex.    COVID-19 PCR positive -No evidence of pneumonia in the chest x-ray. -Not requiring supplemental oxygen. -Given 3 days of IV remdesivir.  -Continue bronchodilators, pulm toileting  Orthostatic hypotension History of essential hypertension -Patient had orthostatic hypotension on 6/30 with lightheadedness.  IV fluid given for 24 hours.  Metoprolol held. -She was adequately hydrated.  IV fluid stopped.  Metoprolol resumed.  Blood pressure stable at this time.  Generalized weakness/falls -Likely multifactorial secondary to UTI, positive COVID-19 infection. -Slowly improving clinically. -PT eval obtained. -Left hip area bruising is probably related to fall.  Chronic anemia -Hemoglobin stable between 8-9 Recent Labs    07/11/20 0502 07/12/20 0517 07/13/20 0446  07/14/20 0554 07/15/20 0440  HGB 8.1* 8.3* 9.1* 8.3* 8.7*  MCV 100.9* 100.4* 100.0 98.0 99.2  VITAMINB12 1,149*  --   --   --   --   FOLATE 12.6  --   --   --   --   FERRITIN 119 106 96 81 81  TIBC 252  --   --   --   --   IRON 64  --   --   --   --    Elevated troponin -Patient mildly elevated troponin likely secondary to demand ischemia. -Patient asymptomatic, denies any chest pain or significant shortness of breath.   -EKG with nonspecific T wave inversion in aVL otherwise no changes of ischemia noted. -2D echo with EF of 60 to 65%, NWMA, mild LVH, grade 2 diastolic dysfunction, mild to moderate MVR, moderate TVR.   -No further cardiac work-up needed at this time.  History of breast cancer in remission -Continue Arimidex.   Mobility: Encourage ambulation Code Status:   Code Status: DNR  Nutritional status: Body mass index is 25.17 kg/m.     Diet Order             Diet regular Room service appropriate? Yes; Fluid consistency: Thin  Diet effective now                   DVT prophylaxis:  enoxaparin (LOVENOX) injection 40 mg Start: 07/11/20 1000 Place and maintain sequential compression device Start: 07/10/20 1726 SCDs Start: 07/09/20 1839   Antimicrobials: Oral Keflex. Fluid: None Consultants: None Family Communication: None at bedside  Status is: Inpatient  Remains inpatient appropriate because: Pending placement  Dispo: The patient is from: Home  Anticipated d/c is to: Nursing facility when available              Patient currently is medically stable to d/c.   Difficult to place patient No     Infusions:      Scheduled Meds:  anastrozole  1 mg Oral Daily   vitamin C  500 mg Oral Daily   aspirin EC  81 mg Oral Daily   clonazePAM  0.5 mg Oral BID   enoxaparin (LOVENOX) injection  40 mg Subcutaneous Q24H   Ipratropium-Albuterol  2 puff Inhalation Q6H   loratadine  10 mg Oral Daily   metoprolol tartrate  25 mg Oral BID   nystatin    Topical BID   pantoprazole  40 mg Oral Q0600   PARoxetine  30 mg Oral Daily   zinc sulfate  220 mg Oral Daily    Antimicrobials: Anti-infectives (From admission, onward)    Start     Dose/Rate Route Frequency Ordered Stop   07/12/20 1400  cephALEXin (KEFLEX) capsule 500 mg  Status:  Discontinued        500 mg Oral Every 8 hours 07/12/20 1223 07/16/20 1247   07/10/20 1500  cefTRIAXone (ROCEPHIN) 1 g in sodium chloride 0.9 % 100 mL IVPB  Status:  Discontinued        1 g 200 mL/hr over 30 Minutes Intravenous Every 24 hours 07/09/20 1841 07/12/20 1219   07/10/20 1000  remdesivir 100 mg in sodium chloride 0.9 % 100 mL IVPB       See Hyperspace for full Linked Orders Report.   100 mg 200 mL/hr over 30 Minutes Intravenous Daily 07/09/20 1956 07/11/20 1129   07/09/20 2200  remdesivir 200 mg in sodium chloride 0.9% 250 mL IVPB       See Hyperspace for full Linked Orders Report.   200 mg 580 mL/hr over 30 Minutes Intravenous  Once 07/09/20 1956 07/09/20 2207   07/09/20 1800  cefTRIAXone (ROCEPHIN) 1 g in sodium chloride 0.9 % 100 mL IVPB        1 g 200 mL/hr over 30 Minutes Intravenous  Once 07/09/20 1749 07/09/20 1823       PRN meds: acetaminophen **OR** acetaminophen, albuterol, guaiFENesin-dextromethorphan, hydrALAZINE, traZODone   Objective: Vitals:   07/16/20 0302 07/16/20 0915  BP: 133/69 120/61  Pulse: 76 74  Resp: 18 17  Temp: 98.7 F (37.1 C) 98.8 F (37.1 C)  SpO2: 95% 93%    Intake/Output Summary (Last 24 hours) at 07/16/2020 1313 Last data filed at 07/16/2020 0556 Gross per 24 hour  Intake --  Output 1100 ml  Net -1100 ml    Filed Weights   07/09/20 1513 07/11/20 0600 07/16/20 0500  Weight: 72.5 kg 71.5 kg 68.6 kg   Weight change:  Body mass index is 25.17 kg/m.   Physical Exam: General exam: Pleasant, elderly Caucasian female. Not in physical distress Skin: No rashes, lesions or ulcers. HEENT: Atraumatic, normocephalic, no obvious bleeding Lungs: Clear  to auscultation bilaterally CVS: Regular rate and rhythm, no murmur GI/Abd soft, nontender, nondistended, bowel sound present CNS: Alert, awake, knows she is in the hospital. Psychiatry: Mood appropriate Extremities: Right trace pedal edema.  Left hip area with extensive bruising  Data Review: I have personally reviewed the laboratory data and studies available.  Recent Labs  Lab 07/11/20 0502 07/12/20 0517 07/13/20 0446 07/14/20 0554 07/15/20 0440  WBC 5.6 5.8 6.7 6.5 7.5  NEUTROABS 2.6 2.6 3.5 3.6 4.6  HGB 8.1* 8.3*  9.1* 8.3* 8.7*  HCT 23.7* 24.8* 26.2* 24.7* 25.1*  MCV 100.9* 100.4* 100.0 98.0 99.2  PLT 137* 155 202 226 255    Recent Labs  Lab 07/11/20 0502 07/12/20 0517 07/13/20 0446 07/14/20 0554 07/15/20 0440  NA 136 138 136 138 134*  K 3.8 3.7 4.1 3.9 4.0  CL 105 107 105 108 103  CO2 24 25 26 25 26   GLUCOSE 103* 103* 115* 110* 105*  BUN 25* 21 19 20 19   CREATININE 0.82 0.83 0.84 0.82 1.14*  CALCIUM 8.1* 8.4* 8.7* 8.7* 8.2*  MG 1.8 1.8 1.8 1.7 1.7  PHOS 2.7 2.9 2.9 3.3 3.7     F/u labs ordered Unresulted Labs (From admission, onward)    None       Signed, Terrilee Croak, MD [facility] 07/16/2020

## 2020-07-17 MED ORDER — ENOXAPARIN SODIUM 30 MG/0.3ML IJ SOSY
30.0000 mg | PREFILLED_SYRINGE | INTRAMUSCULAR | Status: DC
  Administered 2020-07-17: 30 mg via SUBCUTANEOUS

## 2020-07-17 NOTE — Progress Notes (Signed)
PHARMACIST - PHYSICIAN COMMUNICATION  CONCERNING:  Enoxaparin (Lovenox) for DVT Prophylaxis    RECOMMENDATION: Patient was prescribed enoxaparin 40mg  q24 hours for VTE prophylaxis.   Filed Weights   07/11/20 0600 07/16/20 0500 07/17/20 0500  Weight: 71.5 kg (157 lb 10.1 oz) 68.6 kg (151 lb 3.8 oz) 68.1 kg (150 lb 2.1 oz)    Body mass index is 24.98 kg/m.  Estimated Creatinine Clearance: 27.7 mL/min (A) (by C-G formula based on SCr of 1.14 mg/dL (H)).   Patient is candidate for enoxaparin 30mg  every 24 hours based on CrCl <30ml/min or Weight <45kg  DESCRIPTION: Pharmacy has adjusted enoxaparin dose per [facility] policy.  Patient is now receiving enoxaparin 30 mg every 24 hours    Benita Gutter 07/17/2020 9:51 AM

## 2020-07-17 NOTE — Progress Notes (Signed)
PROGRESS NOTE  [name]  DOB: [birth year]  PCP: Kelton Pillar, MD ATF:[id number]  DOA: 07/09/2020  LOS: 8 days  Hospital Day: 9   Chief Complaint  Patient presents with   Fall    Brief narrative: [name] is a 87 y.o. female with PMH significant for hypertension.  Patient presented to the ED on 6/26 with acute cystitis, Leba Tibbitts, generalized weakness, progressive for last 2 weeks with no focal neurological deficits. In the ED, patient was noted to have COVID 19 PCR test positive Urinalysis consistent with UTI.  Troponin elevated without cardiac symptoms.  Admitted to hospitalist service for further evaluation management.   Subjective: Patient was seen and examined this morning.  Not in distress.  Sitting up in chair.  Assessment/Plan: Acute cystitis/E. coli UTI -Patient presented with a 7-day history of new onset dysuria, urinary urgency, generalized weakness. -Urinalysis consistent with UTI. -Urine cultures with > 100,000 E Coli -Blood cultures pending with no growth to date. -Completed 7-day course of oral Keflex.   -Clinically stable.  COVID-19 PCR positive -No evidence of pneumonia in the chest x-ray. -Not requiring supplemental oxygen. -Given 3 days of IV remdesivir.  -Continue bronchodilators, pulm toileting  Essential hypertension  -Continue metoprolol.  Blood pressure stable.  Generalized weakness/falls -Likely multifactorial secondary to UTI, positive COVID-19 infection. -Slowly improving clinically. -PT eval obtained. -Left hip area bruising is probably related to fall.  Chronic anemia -Hemoglobin stable between 8-9 Recent Labs    07/11/20 0502 07/12/20 0517 07/13/20 0446 07/14/20 0554 07/15/20 0440  HGB 8.1* 8.3* 9.1* 8.3* 8.7*  MCV 100.9* 100.4* 100.0 98.0 99.2  VITAMINB12 1,149*  --   --   --   --   FOLATE 12.6  --   --   --   --   FERRITIN 119 106 96 81 81  TIBC 252  --   --   --   --   IRON 64  --   --   --   --     Elevated troponin -Patient mildly elevated troponin likely secondary to demand ischemia. -Patient asymptomatic, denies any chest pain or significant shortness of breath.   -EKG with nonspecific T wave inversion in aVL otherwise no changes of ischemia noted. -2D echo with EF of 60 to 65%, NWMA, mild LVH, grade 2 diastolic dysfunction, mild to moderate MVR, moderate TVR.   -No further cardiac work-up needed at this time.  History of breast cancer in remission -Continue Arimidex.   Mobility: Encourage ambulation Code Status:   Code Status: DNR  Nutritional status: Body mass index is 24.98 kg/m.     Diet Order             Diet regular Room service appropriate? Yes; Fluid consistency: Thin  Diet effective now                   DVT prophylaxis:  enoxaparin (LOVENOX) injection 30 mg Start: 07/17/20 1045 Place and maintain sequential compression device Start: 07/10/20 1726 SCDs Start: 07/09/20 1839   Antimicrobials: Oral Keflex. Fluid: None Consultants: None Family Communication: None at bedside  Status is: Inpatient  Remains inpatient appropriate because: Pending placement  Dispo: The patient is from: Home              Anticipated d/c is to: Nursing facility when available              Patient currently is medically stable to d/c.   Difficult to place patient No  Infusions:      Scheduled Meds:  anastrozole  1 mg Oral Daily   vitamin C  500 mg Oral Daily   aspirin EC  81 mg Oral Daily   clonazePAM  0.5 mg Oral BID   enoxaparin (LOVENOX) injection  30 mg Subcutaneous Q24H   Ipratropium-Albuterol  2 puff Inhalation Q6H   loratadine  10 mg Oral Daily   metoprolol tartrate  25 mg Oral BID   nystatin   Topical BID   pantoprazole  40 mg Oral Q0600   PARoxetine  30 mg Oral Daily   zinc sulfate  220 mg Oral Daily    Antimicrobials: Anti-infectives (From admission, onward)    Start     Dose/Rate Route Frequency Ordered Stop   07/12/20 1400  cephALEXin  (KEFLEX) capsule 500 mg  Status:  Discontinued        500 mg Oral Every 8 hours 07/12/20 1223 07/16/20 1247   07/10/20 1500  cefTRIAXone (ROCEPHIN) 1 g in sodium chloride 0.9 % 100 mL IVPB  Status:  Discontinued        1 g 200 mL/hr over 30 Minutes Intravenous Every 24 hours 07/09/20 1841 07/12/20 1219   07/10/20 1000  remdesivir 100 mg in sodium chloride 0.9 % 100 mL IVPB       See Hyperspace for full Linked Orders Report.   100 mg 200 mL/hr over 30 Minutes Intravenous Daily 07/09/20 1956 07/11/20 1129   07/09/20 2200  remdesivir 200 mg in sodium chloride 0.9% 250 mL IVPB       See Hyperspace for full Linked Orders Report.   200 mg 580 mL/hr over 30 Minutes Intravenous  Once 07/09/20 1956 07/09/20 2207   07/09/20 1800  cefTRIAXone (ROCEPHIN) 1 g in sodium chloride 0.9 % 100 mL IVPB        1 g 200 mL/hr over 30 Minutes Intravenous  Once 07/09/20 1749 07/09/20 1823       PRN meds: acetaminophen **OR** acetaminophen, albuterol, guaiFENesin-dextromethorphan, hydrALAZINE, polyethylene glycol, traZODone   Objective: Vitals:   07/17/20 0500 07/17/20 0814  BP: 138/62 122/62  Pulse: 72 70  Resp: 16 18  Temp: 98.6 F (37 C) 98.7 F (37.1 C)  SpO2: 98% 95%    Intake/Output Summary (Last 24 hours) at 07/17/2020 1149 Last data filed at 07/17/2020 0600 Gross per 24 hour  Intake 600 ml  Output 900 ml  Net -300 ml    Filed Weights   07/11/20 0600 07/16/20 0500 07/17/20 0500  Weight: 71.5 kg 68.6 kg 68.1 kg   Weight change: -0.5 kg Body mass index is 24.98 kg/m.   Physical Exam: General exam: Pleasant, elderly Caucasian female. Not in physical distress Skin: No rashes, lesions or ulcers. HEENT: Atraumatic, normocephalic, no obvious bleeding Lungs: Clear to auscultation bilaterally CVS: Regular rate and rhythm, no murmur GI/Abd soft, nontender, nondistended, bowel sound present CNS: Alert, awake, knows she is in the hospital. Psychiatry: Mood appropriate Extremities: Right  trace pedal edema.  Left hip area with extensive bruising  Data Review: I have personally reviewed the laboratory data and studies available.  Recent Labs  Lab 07/11/20 0502 07/12/20 0517 07/13/20 0446 07/14/20 0554 07/15/20 0440  WBC 5.6 5.8 6.7 6.5 7.5  NEUTROABS 2.6 2.6 3.5 3.6 4.6  HGB 8.1* 8.3* 9.1* 8.3* 8.7*  HCT 23.7* 24.8* 26.2* 24.7* 25.1*  MCV 100.9* 100.4* 100.0 98.0 99.2  PLT 137* 155 202 226 255    Recent Labs  Lab 07/11/20 0502 07/12/20 0517  07/13/20 0446 07/14/20 0554 07/15/20 0440  NA 136 138 136 138 134*  K 3.8 3.7 4.1 3.9 4.0  CL 105 107 105 108 103  CO2 24 25 26 25 26   GLUCOSE 103* 103* 115* 110* 105*  BUN 25* 21 19 20 19   CREATININE 0.82 0.83 0.84 0.82 1.14*  CALCIUM 8.1* 8.4* 8.7* 8.7* 8.2*  MG 1.8 1.8 1.8 1.7 1.7  PHOS 2.7 2.9 2.9 3.3 3.7     F/u labs ordered Unresulted Labs (From admission, onward)    None       Signed, Terrilee Croak, MD [facility] 07/17/2020

## 2020-07-18 LAB — CBC WITH DIFFERENTIAL/PLATELET
Abs Immature Granulocytes: 0.05 10*3/uL (ref 0.00–0.07)
Basophils Absolute: 0 10*3/uL (ref 0.0–0.1)
Basophils Relative: 0 %
Eosinophils Absolute: 0.4 10*3/uL (ref 0.0–0.5)
Eosinophils Relative: 5 %
HCT: 25.3 % — ABNORMAL LOW (ref 36.0–46.0)
Hemoglobin: 8.3 g/dL — ABNORMAL LOW (ref 12.0–15.0)
Immature Granulocytes: 1 %
Lymphocytes Relative: 22 %
Lymphs Abs: 1.6 10*3/uL (ref 0.7–4.0)
MCH: 34.3 pg — ABNORMAL HIGH (ref 26.0–34.0)
MCHC: 32.8 g/dL (ref 30.0–36.0)
MCV: 104.5 fL — ABNORMAL HIGH (ref 80.0–100.0)
Monocytes Absolute: 0.5 10*3/uL (ref 0.1–1.0)
Monocytes Relative: 7 %
Neutro Abs: 4.8 10*3/uL (ref 1.7–7.7)
Neutrophils Relative %: 65 %
Platelets: 267 10*3/uL (ref 150–400)
RBC: 2.42 MIL/uL — ABNORMAL LOW (ref 3.87–5.11)
RDW: 12.4 % (ref 11.5–15.5)
WBC: 7.3 10*3/uL (ref 4.0–10.5)
nRBC: 0 % (ref 0.0–0.2)

## 2020-07-18 LAB — BASIC METABOLIC PANEL
Anion gap: 4 — ABNORMAL LOW (ref 5–15)
BUN: 31 mg/dL — ABNORMAL HIGH (ref 8–23)
CO2: 27 mmol/L (ref 22–32)
Calcium: 8.9 mg/dL (ref 8.9–10.3)
Chloride: 104 mmol/L (ref 98–111)
Creatinine, Ser: 0.96 mg/dL (ref 0.44–1.00)
GFR, Estimated: 55 mL/min — ABNORMAL LOW (ref >60)
Glucose, Bld: 105 mg/dL — ABNORMAL HIGH (ref 70–99)
Potassium: 4.7 mmol/L (ref 3.5–5.1)
Sodium: 135 mmol/L (ref 135–145)

## 2020-07-18 MED ORDER — ENOXAPARIN SODIUM 40 MG/0.4ML IJ SOSY
40.0000 mg | PREFILLED_SYRINGE | INTRAMUSCULAR | Status: DC
  Administered 2020-07-18 – 2020-07-20 (×3): 40 mg via SUBCUTANEOUS
  Filled 2020-07-18 (×3): qty 0.4

## 2020-07-18 NOTE — TOC Progression Note (Signed)
Transition of Care [facility]) - Progression Note    Patient Details  Name: PERL FOLMAR MRN: [id number] Date of Birth: 1925-01-07  Transition of Care [facility]) CM/SW Contact  Beverly Sessions, RN Phone Number: 07/18/2020, 2:01 PM  Clinical Narrative:     Anticipated DC 7/7 to [place].   Requested updated PT note. Will start auth after PT session   Expected Discharge Plan: [place] Barriers to Discharge: Continued Medical Work up  Expected Discharge Plan and Services Expected Discharge Plan: [place]   Discharge Planning Services: CM Consult   Living arrangements for the past 2 months: Single Family Home                                       Social Determinants of Health (SDOH) Interventions    Readmission Risk Interventions No flowsheet data found.

## 2020-07-18 NOTE — Progress Notes (Signed)
PROGRESS NOTE  [name]  DOB: 1924/02/28  PCP: Kelton Pillar, MD XIP:[id number]  DOA: 07/09/2020  LOS: 9 days  Hospital Day: 10   Chief Complaint  Patient presents with   Fall    Brief narrative: [name] is a 87 y.o. female with PMH significant for hypertension.  Patient presented to the ED on 6/26 with acute cystitis, [name], generalized weakness, progressive for last 2 weeks with no focal neurological deficits. In the ED, patient was noted to have COVID 19 PCR test positive Urinalysis consistent with UTI.  Troponin elevated without cardiac symptoms.  Admitted to hospitalist service for further evaluation management.   Subjective: Patient was seen and examined this morning.  Slouched in bed.  Opens eyes on verbal command.  No new symptoms. Remains hemodynamically stable.  Assessment/Plan: Acute cystitis/E. coli UTI -Patient presented with a 7-day history of new onset dysuria, urinary urgency, generalized weakness. -Urinalysis consistent with UTI. -Urine cultures with > 100,000 E Coli -Blood cultures with no growth to date. -Completed 7-day course of oral Keflex.   -Clinically stable.  COVID-19 PCR positive -No evidence of pneumonia in the chest x-ray. -Not requiring supplemental oxygen. -Given 3 days of IV remdesivir.  -Continue bronchodilators, pulm toileting  Essential hypertension  -Continue metoprolol.  Blood pressure stable.  Generalized weakness/falls -Likely multifactorial secondary to UTI, positive COVID-19 infection. -Slowly improving clinically. -PT eval obtained. -Left hip area bruising is probably related to fall.  Chronic anemia -Hemoglobin stable between 8-9 Recent Labs    07/11/20 0502 07/12/20 0517 07/13/20 0446 07/14/20 0554 07/15/20 0440 07/18/20 0604  HGB 8.1* 8.3* 9.1* 8.3* 8.7* 8.3*  MCV 100.9* 100.4* 100.0 98.0 99.2 104.5*  VITAMINB12 1,149*  --   --   --   --   --   FOLATE 12.6  --   --   --   --   --   FERRITIN  119 106 96 81 81  --   TIBC 252  --   --   --   --   --   IRON 64  --   --   --   --   --    Elevated troponin -Patient mildly elevated troponin likely secondary to demand ischemia. -Patient asymptomatic, denies any chest pain or significant shortness of breath.   -EKG with nonspecific T wave inversion in aVL otherwise no changes of ischemia noted. -2D echo with EF of 60 to 65%, NWMA, mild LVH, grade 2 diastolic dysfunction, mild to moderate MVR, moderate TVR.   -No further cardiac work-up needed at this time.  History of breast cancer in remission -Continue Arimidex.   Mobility: Encourage ambulation Code Status:   Code Status: DNR  Nutritional status: Body mass index is 24.98 kg/m.     Diet Order             Diet regular Room service appropriate? Yes; Fluid consistency: Thin  Diet effective now                   DVT prophylaxis:  enoxaparin (LOVENOX) injection 40 mg Start: 07/18/20 1200 Place and maintain sequential compression device Start: 07/10/20 1726 SCDs Start: 07/09/20 1839   Antimicrobials: Oral Keflex. Fluid: None Consultants: None Family Communication: None at bedside  Status is: Inpatient  Remains inpatient appropriate because: Pending placement  Dispo: The patient is from: Home              Anticipated d/c is to: Nursing facility when available  Patient currently is medically stable to d/c.   Difficult to place patient No     Infusions:      Scheduled Meds:  anastrozole  1 mg Oral Daily   vitamin C  500 mg Oral Daily   aspirin EC  81 mg Oral Daily   clonazePAM  0.5 mg Oral BID   enoxaparin (LOVENOX) injection  40 mg Subcutaneous Q24H   Ipratropium-Albuterol  2 puff Inhalation Q6H   loratadine  10 mg Oral Daily   metoprolol tartrate  25 mg Oral BID   nystatin   Topical BID   pantoprazole  40 mg Oral Q0600   PARoxetine  30 mg Oral Daily   zinc sulfate  220 mg Oral Daily    Antimicrobials: Anti-infectives (From  admission, onward)    Start     Dose/Rate Route Frequency Ordered Stop   07/12/20 1400  cephALEXin (KEFLEX) capsule 500 mg  Status:  Discontinued        500 mg Oral Every 8 hours 07/12/20 1223 07/16/20 1247   07/10/20 1500  cefTRIAXone (ROCEPHIN) 1 g in sodium chloride 0.9 % 100 mL IVPB  Status:  Discontinued        1 g 200 mL/hr over 30 Minutes Intravenous Every 24 hours 07/09/20 1841 07/12/20 1219   07/10/20 1000  remdesivir 100 mg in sodium chloride 0.9 % 100 mL IVPB       See Hyperspace for full Linked Orders Report.   100 mg 200 mL/hr over 30 Minutes Intravenous Daily 07/09/20 1956 07/11/20 1129   07/09/20 2200  remdesivir 200 mg in sodium chloride 0.9% 250 mL IVPB       See Hyperspace for full Linked Orders Report.   200 mg 580 mL/hr over 30 Minutes Intravenous  Once 07/09/20 1956 07/09/20 2207   07/09/20 1800  cefTRIAXone (ROCEPHIN) 1 g in sodium chloride 0.9 % 100 mL IVPB        1 g 200 mL/hr over 30 Minutes Intravenous  Once 07/09/20 1749 07/09/20 1823       PRN meds: acetaminophen **OR** acetaminophen, albuterol, guaiFENesin-dextromethorphan, hydrALAZINE, polyethylene glycol, traZODone   Objective: Vitals:   07/18/20 0520 07/18/20 0822  BP: (!) 122/55 121/61  Pulse: 73 71  Resp: 20 18  Temp: 98.7 F (37.1 C) 98.9 F (37.2 C)  SpO2: 93% 94%    Intake/Output Summary (Last 24 hours) at 07/18/2020 1130 Last data filed at 07/17/2020 2316 Gross per 24 hour  Intake 0 ml  Output 1200 ml  Net -1200 ml    Filed Weights   07/11/20 0600 07/16/20 0500 07/17/20 0500  Weight: 71.5 kg 68.6 kg 68.1 kg   Weight change:  Body mass index is 24.98 kg/m.   Physical Exam: General exam: Pleasant, elderly Caucasian female.  Not in physical distress Skin: No rashes, lesions or ulcers. HEENT: Atraumatic, normocephalic, no obvious bleeding Lungs: Clear to auscultation bilaterally CVS: Regular rate and rhythm, no murmur GI/Abd soft, nontender, nondistended, bowel sound  present CNS: Alert, awake, knows she is in the hospital.  Able to have a simple conversation Psychiatry: Mood appropriate Extremities: Right trace pedal edema.  Left hip area with extensive bruising  Data Review: I have personally reviewed the laboratory data and studies available.  Recent Labs  Lab 07/12/20 0517 07/13/20 0446 07/14/20 0554 07/15/20 0440 07/18/20 0604  WBC 5.8 6.7 6.5 7.5 7.3  NEUTROABS 2.6 3.5 3.6 4.6 4.8  HGB 8.3* 9.1* 8.3* 8.7* 8.3*  HCT 24.8* 26.2* 24.7* 25.1*  25.3*  MCV 100.4* 100.0 98.0 99.2 104.5*  PLT 155 202 226 255 267    Recent Labs  Lab 07/12/20 0517 07/13/20 0446 07/14/20 0554 07/15/20 0440 07/18/20 0604  NA 138 136 138 134* 135  K 3.7 4.1 3.9 4.0 4.7  CL 107 105 108 103 104  CO2 25 26 25 26 27   GLUCOSE 103* 115* 110* 105* 105*  BUN 21 19 20 19  31*  CREATININE 0.83 0.84 0.82 1.14* 0.96  CALCIUM 8.4* 8.7* 8.7* 8.2* 8.9  MG 1.8 1.8 1.7 1.7  --   PHOS 2.9 2.9 3.3 3.7  --      F/u labs ordered Unresulted Labs (From admission, onward)    None       Signed, Terrilee Croak, MD [facility] 07/18/2020

## 2020-07-18 NOTE — Progress Notes (Signed)
Physical Therapy Treatment Patient Details Name: [name] MRN: [id number] DOB: [birth year] Today's Date: 07/18/2020    History of Present Illness Pt is a 87 y/o F admitted on 07/09/20 with c/c of generalized weakness. Pt being treated for acute cystitis. Pt found to be COVID (+). Pt endorses 2 falls 2/2 tripping over the last 10 days. PMH: HTN, R knee arthritis, carotid artery occlusion, depression, HLD, mini stroke (July 2005)    PT Comments    Pt was asleep in recliner with cervical spine in severe flexion. She easily awakes and agrees to PT session. Does endorse severe fatigue but with encouragement agrees to standing/ OOB activity. Pt presents with more severe posterior lean/LOB upon standing. Required less assistance each trial but overall is a high fall risk due to the posterior push. She fatigued quickly however vitals were stable throughout. After 3 trial standing, requested to get back into bed. Was able to ambulate ~ 5 ft with mod assistance to prevent falling. Pt will greatly benefit from continued skilled PT to address deficits while maximizing independence with ADLs.   Follow Up Recommendations  SNF     Equipment Recommendations  Other (comment) (defer to next level of care)       Precautions / Restrictions Precautions Precautions: Fall Restrictions Weight Bearing Restrictions: No    Mobility  Bed Mobility Overal bed mobility: Needs Assistance Bed Mobility: Sit to Supine Rolling: Supervision   Supine to sit: Min assist Sit to supine: Min assist;Mod assist   General bed mobility comments: pt required min-mod assist to return to supine in bed    Transfers Overall transfer level: Needs assistance Equipment used: Rolling walker (2 wheeled) Transfers: Sit to/from Stand Sit to Stand: Mod assist Stand pivot transfers: Mod assist       General transfer comment: Pt performed STS 3 x from recliner prior to taking steps to EOB ~ 5 ft. Pt has severe posterior  push initiallly however did improve with trials.  Ambulation/Gait Ambulation/Gait assistance: Mod assist Gait Distance (Feet): 5 Feet Assistive device: Rolling walker (2 wheeled) Gait Pattern/deviations: Trunk flexed;Shuffle;Step-to pattern;Narrow base of support Gait velocity: decreased   General Gait Details: Pt was able to ambulate ~ 5 ft from recliner to EOB. pt very fatigued with minimal activity however HR, sao2, and RR all stable. " Can you come earlier in the day next time? I need a nap."     Balance Overall balance assessment: Needs assistance Sitting-balance support: Feet supported;Bilateral upper extremity supported;Single extremity supported Sitting balance-Leahy Scale: Fair     Standing balance support: During functional activity;Bilateral upper extremity supported Standing balance-Leahy Scale: Poor Standing balance comment: severe posterior lean throughout all standing activity     Cognition Arousal/Alertness: Awake/alert Behavior During Therapy: WFL for tasks assessed/performed Overall Cognitive Status: No family/caregiver present to determine baseline cognitive functioning       General Comments: Pt is A but lethargic. Needed encouragement but once motivated was agreeable. pt has severe cervical flexion             Pertinent Vitals/Pain Pain Assessment: No/denies pain           PT Goals (current goals can now be found in the care plan section) Acute Rehab PT Goals Patient Stated Goal: feel better Progress towards PT goals: Progressing toward goals    Frequency    Min 2X/week      PT Plan Current plan remains appropriate       AM-PAC PT "6 Clicks" Mobility  Outcome Measure  Help needed turning from your back to your side while in a flat bed without using bedrails?: A Lot Help needed moving from lying on your back to sitting on the side of a flat bed without using bedrails?: A Lot Help needed moving to and from a bed to a chair (including  a wheelchair)?: A Lot Help needed standing up from a chair using your arms (e.g., wheelchair or bedside chair)?: A Lot Help needed to walk in hospital room?: A Lot Help needed climbing 3-5 steps with a railing? : A Lot 6 Click Score: 12    End of Session Equipment Utilized During Treatment: Gait belt Activity Tolerance: Patient limited by lethargy;Patient limited by fatigue Patient left: in bed;with call bell/phone within reach;with bed alarm set Nurse Communication: Mobility status PT Visit Diagnosis: Unsteadiness on feet (R26.81);Difficulty in walking, not elsewhere classified (R26.2);Muscle weakness (generalized) (M62.81)     Time: 9030-1499 PT Time Calculation (min) (ACUTE ONLY): 14 min  Charges:  $Therapeutic Activity: 8-22 mins                     Julaine Fusi PTA 07/18/20, 3:39 PM

## 2020-07-18 NOTE — Progress Notes (Signed)
Occupational Therapy Treatment Patient Details Name: [name] MRN: [id number] DOB: 02/01/1924 Today's Date: 07/18/2020    History of present illness Pt is a 87 y/o F admitted on 07/09/20 with c/c of generalized weakness. Pt being treated for acute cystitis. Pt found to be COVID (+). Pt endorses 2 falls 2/2 tripping over the last 10 days. PMH: HTN, R knee arthritis, carotid artery occlusion, depression, HLD, mini stroke (July 2005)   OT comments  [name] was seen for OT treatment on this date. Upon arrival to room pt reclined in bed, remarks that she feels weak from being in bed too much. Pt requires MIN A for bed mobility. SETUP + SBA for seated grooming tasks seated EOB. Pt completed sit<>stand x10 at EOB and seated BUE/BLE therex as described below. MOD A for bed>chair t/f. Pt making good progress toward goals. Pt continues to benefit from skilled OT services to maximize return to PLOF and minimize risk of future falls, injury, caregiver burden, and readmission. Will continue to follow POC. Discharge recommendation remains appropriate.    Follow Up Recommendations  SNF    Equipment Recommendations  3 in 1 bedside commode    Recommendations for Other Services      Precautions / Restrictions Precautions Precautions: Fall Restrictions Weight Bearing Restrictions: No       Mobility Bed Mobility Overal bed mobility: Needs Assistance Bed Mobility: Rolling;Supine to Sit;Sit to Supine Rolling: Supervision   Supine to sit: Min assist Sit to supine: Min assist        Transfers Overall transfer level: Needs assistance Equipment used: Rolling walker (2 wheeled) Transfers: Sit to/from [facility] Sit to Stand: Mod assist;From elevated surface Stand pivot transfers: Mod assist            Balance Overall balance assessment: Needs assistance Sitting-balance support: Feet supported;Bilateral upper extremity supported;Single extremity supported Sitting  balance-Leahy Scale: Fair     Standing balance support: During functional activity;Bilateral upper extremity supported Standing balance-Leahy Scale: Poor Standing balance comment: severe posterior lean throughout all standing activity                           ADL either performed or assessed with clinical judgement   ADL Overall ADL's : Needs assistance/impaired                                       General ADL Comments: MOD A for ADL t/f. SETUP + SBA for seated grooming tasks seated EOB, pt tolerates ~10 min sitting.      Cognition Arousal/Alertness: Awake/alert Behavior During Therapy: WFL for tasks assessed/performed Overall Cognitive Status: No family/caregiver present to determine baseline cognitive functioning                                          Exercises Exercises: Other exercises;General Upper Extremity;General Lower Extremity General Exercises - Upper Extremity Shoulder Flexion: AROM;Strengthening;Both;10 reps;Seated Shoulder Extension: AROM;Strengthening;Both;10 reps;Seated Shoulder Horizontal ABduction: AROM;Strengthening;Both;10 reps;Seated Shoulder Horizontal ADduction: AROM;Strengthening;Both;10 reps;Seated Elbow Flexion: AROM;Strengthening;Both;10 reps;Seated Elbow Extension: AROM;Strengthening;Both;10 reps;Seated General Exercises - Lower Extremity Quad Sets: AROM;Strengthening;Both;10 reps;Seated Long Arc Quad: AROM;Strengthening;Both;10 reps;Seated Hip ABduction/ADduction: AROM;Strengthening;Both;10 reps;Seated Hip Flexion/Marching: AROM;Strengthening;Both;10 reps;Seated Other Exercises Other Exercises: Pt educated re: OT role, DME recs, d/c recs, falls prevention, ECS, HEP  Other Exercises: Grooming, sup<>sit, rolling, sit<>stand x10, sitting/standing balance/tolerance, SPT    Pertinent Vitals/ Pain       Pain Assessment: No/denies pain   Frequency  Min 1X/week        Progress Toward Goals  OT  Goals(current goals can now be found in the care plan section)  Progress towards OT goals: Progressing toward goals  Acute Rehab OT Goals Patient Stated Goal: feel better OT Goal Formulation: With patient Time For Goal Achievement: 07/24/20 Potential to Achieve Goals: Good ADL Goals Pt Will Perform Grooming: with modified independence;sitting Pt Will Perform Lower Body Dressing: sitting/lateral leans;with set-up;with supervision Pt Will Transfer to Toilet: with min assist;stand pivot transfer;bedside commode  Plan Discharge plan remains appropriate;Frequency remains appropriate       AM-PAC OT "6 Clicks" Daily Activity     Outcome Measure   Help from another person eating meals?: None Help from another person taking care of personal grooming?: A Little Help from another person toileting, which includes using toliet, bedpan, or urinal?: A Lot Help from another person bathing (including washing, rinsing, drying)?: A Lot Help from another person to put on and taking off regular upper body clothing?: A Little Help from another person to put on and taking off regular lower body clothing?: A Lot 6 Click Score: 16    End of Session Equipment Utilized During Treatment: Rolling walker  OT Visit Diagnosis: Unsteadiness on feet (R26.81);Muscle weakness (generalized) (M62.81)   Activity Tolerance Patient tolerated treatment well   Patient Left in chair;with call bell/phone within reach;with chair alarm set   Nurse Communication          Time: 7737-3668 OT Time Calculation (min): 29 min  Charges: OT General Charges $OT Visit: 1 Visit OT Treatments $Self Care/Home Management : 8-22 mins $Therapeutic Exercise: 8-22 mins  Dessie Coma, M.S. OTR/L  07/18/20, 12:57 PM  ascom [phone]

## 2020-07-18 NOTE — TOC Progression Note (Signed)
Transition of Care [facility]) - Progression Note    Patient Details  Name: [name] MRN: [id number] Date of Birth: 03-14-1924  Transition of Care [facility]) CM/SW Contact  Beverly Sessions, RN Phone Number: 07/18/2020, 3:50 PM  Clinical Narrative:    Spoke with Tammy at [facility].  She is to start auth for SNF and EMS.  With anticipated admission date 07/20/20   Expected Discharge Plan: [place] Barriers to Discharge: Continued Medical Work up  Expected Discharge Plan and Services Expected Discharge Plan: [place]   Discharge Planning Services: CM Consult   Living arrangements for the past 2 months: Single Family Home                                       Social Determinants of Health (SDOH) Interventions    Readmission Risk Interventions No flowsheet data found.

## 2020-07-19 MED ORDER — ENSURE ENLIVE PO LIQD
237.0000 mL | Freq: Two times a day (BID) | ORAL | Status: DC
  Administered 2020-07-19: 237 mL via ORAL

## 2020-07-19 NOTE — TOC Progression Note (Signed)
Transition of Care [facility]) - Progression Note    Patient Details  Name: [name] MRN: [id number] Date of Birth: 1924/05/14  Transition of Care [facility]) CM/SW Contact  Beverly Sessions, RN Phone Number: 07/19/2020, 2:12 PM  Clinical Narrative:     Contacted by Colletta [place] from [facility] transport has been approved through [employer].  Auth # [id number]  Medical Director Dr Amalia Hailey has offered a Peer to Peer for SNF authorization.  To be completed by 5pm.  [phone]  Dr Pietro Cassis notified by secure chat and confirmed he will complete   Expected Discharge Plan: [place] Barriers to Discharge: Continued Medical Work up  Expected Discharge Plan and Services Expected Discharge Plan: [place]   Discharge Planning Services: CM Consult   Living arrangements for the past 2 months: Single Family Home                                       Social Determinants of Health (SDOH) Interventions    Readmission Risk Interventions No flowsheet data found.

## 2020-07-19 NOTE — Progress Notes (Signed)
Initial Nutrition Assessment  DOCUMENTATION CODES:  Not applicable  INTERVENTION:  Add Ensure Enlive po BID, each supplement provides 350 kcal and 20 grams of protein.  NUTRITION DIAGNOSIS:  Increased nutrient needs related to acute illness (COVID-19) as evidenced by estimated needs.  GOAL:  Patient will meet greater than or equal to 90% of their needs  MONITOR:  PO intake, Supplement acceptance, Labs, Weight trends, I & O's  REASON FOR ASSESSMENT:  LOS    ASSESSMENT:  87 yo female with a PMH of HTN who presents with acute cystitis and found to be COVID positive. Pt currently medically stable, finishing quarantine in hospital while awaiting discharge to SNF.  Limited meal documentation during hospital stay, but patient ate 50% of breakfast and lunch on 7/3.  Pt unavailable for visit.  Per Epic, pt's weight has remained between 69-71 kg the past 2 years. Pt's weight at 68 kg currently. Pt has lost ~7 lbs (4.4%) in the last 3 months, which is not necessarily significant for the time frame.  RD to add Ensure Enlive BID.  Medications: reviewed; Vitamin C, Protonix, zinc sulfate  Labs: reviewed  NUTRITION - FOCUSED PHYSICAL EXAM: Unable to perform  Diet Order:   Diet Order             Diet regular Room service appropriate? Yes; Fluid consistency: Thin  Diet effective now                  EDUCATION NEEDS:  No education needs have been identified at this time  Skin:  Skin Assessment: Reviewed RN Assessment  Last BM:  07/17/20  Height:  Ht Readings from Last 1 Encounters:  07/09/20 5\' 5"  (1.651 m)   Weight:  Wt Readings from Last 1 Encounters:  07/17/20 68.1 kg   Ideal Body Weight:  56.8 kg  BMI:  Body mass index is 24.98 kg/m.  Estimated Nutritional Needs:  Kcal:  1400-1600 Protein:  75-90 grams Fluid:  >1.4 L  Derrel Nip, RD, LDN (she/her/hers) Registered Dietitian I After-Hours/Weekend Pager # in [place]

## 2020-07-19 NOTE — Progress Notes (Signed)
PROGRESS NOTE  [name]  DOB: May 17, 1924  PCP: Kelton Pillar, MD [id number]  DOA: 07/09/2020  LOS: 10 days  Hospital Day: 14   Chief Complaint  Patient presents with   Fall    Brief narrative: [name] is a 87 y.o. female with PMH significant for hypertension.   Patient presented to the ED on 6/26 with acute cystitis, generalized weakness, progressive for last 2 weeks with no focal neurological deficits. In the ED, patient was noted to have COVID 19 PCR test positive Urinalysis consistent with UTI.   Troponin elevated without cardiac symptoms.  Admitted to hospitalist service for further evaluation management.   Subjective: Patient was seen and examined this morning.  Sitting up in bed.  Not in distress.  No new symptoms. Remains hemodynamically stable.  Assessment/Plan: Acute cystitis/E. coli UTI -Patient presented with a 7-day history of new onset dysuria, urinary urgency, generalized weakness. -Urinalysis consistent with UTI. -Urine cultures with > 100,000 E Coli -Blood cultures with no growth to date. -Completed 7-day course of oral Keflex.   -Clinically stable.  COVID-19 PCR positive -No evidence of pneumonia in the chest x-ray. -Not requiring supplemental oxygen. -Given 3 days of IV remdesivir.  -Continue bronchodilators, pulm toileting  Essential hypertension  -Continue metoprolol.  Blood pressure stable.  Generalized weakness/falls -Likely multifactorial secondary to UTI, positive COVID-19 infection. -Slowly improving clinically. -PT eval obtained.  SNF recommended.   -Left hip area bruising is probably related to fall.  Chronic anemia -Hemoglobin stable between 8-9 Recent Labs    07/11/20 0502 07/12/20 0517 07/13/20 0446 07/14/20 0554 07/15/20 0440 07/18/20 0604  HGB 8.1* 8.3* 9.1* 8.3* 8.7* 8.3*  MCV 100.9* 100.4* 100.0 98.0 99.2 104.5*  VITAMINB12 1,149*  --   --   --   --   --   FOLATE 12.6  --   --   --   --   --    FERRITIN 119 106 96 81 81  --   TIBC 252  --   --   --   --   --   IRON 64  --   --   --   --   --    Elevated troponin -Patient mildly elevated troponin likely secondary to demand ischemia. -Patient asymptomatic, denies any chest pain or significant shortness of breath.   -EKG with nonspecific T wave inversion in aVL otherwise no changes of ischemia noted. -2D echo with EF of 60 to 65%, NWMA, mild LVH, grade 2 diastolic dysfunction, mild to moderate MVR, moderate TVR.   -No further cardiac work-up needed at this time.  History of breast cancer in remission -Continue Arimidex.    Mobility: Encourage ambulation Code Status:   Code Status: DNR  Nutritional status: Body mass index is 24.98 kg/m. Nutrition Problem: Increased nutrient needs Etiology: acute illness (COVID-19) Signs/Symptoms: estimated needs Diet Order             Diet regular Room service appropriate? Yes; Fluid consistency: Thin  Diet effective now                   DVT prophylaxis:  enoxaparin (LOVENOX) injection 40 mg Start: 07/18/20 1200 Place and maintain sequential compression device Start: 07/10/20 1726 SCDs Start: 07/09/20 1839   Antimicrobials: Completed a course of antibiotics Fluid: None Consultants: None Family Communication: None at bedside  Status is: Inpatient  Remains inpatient appropriate because: Pending placement..  Peer-to-peer to be done with insurance today.  Approved for SNF  Dispo: The patient is from: Home              Anticipated d/c is to: SNF              Patient currently is medically stable to d/c.   Difficult to place patient No   Infusions:    Scheduled Meds:  anastrozole  1 mg Oral Daily   vitamin C  500 mg Oral Daily   aspirin EC  81 mg Oral Daily   clonazePAM  0.5 mg Oral BID   enoxaparin (LOVENOX) injection  40 mg Subcutaneous Q24H   feeding supplement  237 mL Oral BID BM   Ipratropium-Albuterol  2 puff Inhalation Q6H   loratadine  10 mg Oral Daily    metoprolol tartrate  25 mg Oral BID   nystatin   Topical BID   pantoprazole  40 mg Oral Q0600   PARoxetine  30 mg Oral Daily   zinc sulfate  220 mg Oral Daily    Antimicrobials: Anti-infectives (From admission, onward)    Start     Dose/Rate Route Frequency Ordered Stop   07/12/20 1400  cephALEXin (KEFLEX) capsule 500 mg  Status:  Discontinued        500 mg Oral Every 8 hours 07/12/20 1223 07/16/20 1247   07/10/20 1500  cefTRIAXone (ROCEPHIN) 1 g in sodium chloride 0.9 % 100 mL IVPB  Status:  Discontinued        1 g 200 mL/hr over 30 Minutes Intravenous Every 24 hours 07/09/20 1841 07/12/20 1219   07/10/20 1000  remdesivir 100 mg in sodium chloride 0.9 % 100 mL IVPB       See Hyperspace for full Linked Orders Report.   100 mg 200 mL/hr over 30 Minutes Intravenous Daily 07/09/20 1956 07/11/20 1129   07/09/20 2200  remdesivir 200 mg in sodium chloride 0.9% 250 mL IVPB       See Hyperspace for full Linked Orders Report.   200 mg 580 mL/hr over 30 Minutes Intravenous  Once 07/09/20 1956 07/09/20 2207   07/09/20 1800  cefTRIAXone (ROCEPHIN) 1 g in sodium chloride 0.9 % 100 mL IVPB        1 g 200 mL/hr over 30 Minutes Intravenous  Once 07/09/20 1749 07/09/20 1823       PRN meds: acetaminophen **OR** acetaminophen, albuterol, guaiFENesin-dextromethorphan, hydrALAZINE, polyethylene glycol, traZODone   Objective: Vitals:   07/19/20 0528 07/19/20 0824  BP: (!) 115/54 126/60  Pulse: 61 65  Resp: 18 18  Temp: 98.8 F (37.1 C) 98.2 F (36.8 C)  SpO2: 94% 95%    Intake/Output Summary (Last 24 hours) at 07/19/2020 1420 Last data filed at 07/19/2020 0500 Gross per 24 hour  Intake 0 ml  Output 1000 ml  Net -1000 ml    Filed Weights   07/11/20 0600 07/16/20 0500 07/17/20 0500  Weight: 71.5 kg 68.6 kg 68.1 kg   Weight change:  Body mass index is 24.98 kg/m.   Physical Exam: General exam: Pleasant, elderly Caucasian female.  Not in physical distress. Skin: No rashes, lesions  or ulcers. HEENT: Atraumatic, normocephalic, no obvious bleeding Lungs: Clear to auscultation bilaterally CVS: Regular rate and rhythm, no murmur GI/Abd soft, nontender, nondistended, bowel sound present CNS: Alert, awake, slow to respond but oriented x3 Psychiatry: Mood appropriate Extremities: Right trace pedal edema.  Left hip area with extensive bruising  Data Review: I have personally reviewed the laboratory data and studies available.  Recent Labs  Lab 07/13/20  [id number] 07/14/20 0554 07/15/20 0440 07/18/20 0604  WBC 6.7 6.5 7.5 7.3  NEUTROABS 3.5 3.6 4.6 4.8  HGB 9.1* 8.3* 8.7* 8.3*  HCT 26.2* 24.7* 25.1* 25.3*  MCV 100.0 98.0 99.2 104.5*  PLT 202 226 255 267    Recent Labs  Lab 07/13/20 0446 07/14/20 0554 07/15/20 0440 07/18/20 0604  NA 136 138 134* 135  K 4.1 3.9 4.0 4.7  CL 105 108 103 104  CO2 26 25 26 27   GLUCOSE 115* 110* 105* 105*  BUN 19 20 19  31*  CREATININE 0.84 0.82 1.14* 0.96  CALCIUM 8.7* 8.7* 8.2* 8.9  MG 1.8 1.7 1.7  --   PHOS 2.9 3.3 3.7  --      F/u labs ordered Unresulted Labs (From admission, onward)    None       Signed, Terrilee Croak, MD [facility] 07/19/2020

## 2020-07-19 NOTE — Care Management Important Message (Signed)
Important Message  Patient Details  Name: [name] MRN: [id number] Date of Birth: 14-Feb-1924   Medicare Important Message Given:  Yes - Important Message mailed due to current National Emergency   Attempted to review Medicare IM with patient via room phone due to isolation status, however patient had hard time hearing over phone.  Called daughter, Philomena Doheny at [phone] to review.  Copy of Medicare IM sent securely to patient's attention at email address provided: [email or web address]   Dannette Barbara 07/19/2020, 11:18 AM

## 2020-07-19 NOTE — Progress Notes (Signed)
Physical Therapy Treatment Patient Details Name: [name] MRN: [id number] DOB: April 02, 1924 Today's Date: 07/19/2020    History of Present Illness Pt is a 87 y/o F admitted on 07/09/20 with c/c of generalized weakness. Pt being treated for acute cystitis. Pt found to be COVID (+). Pt endorses 2 falls 2/2 tripping over the last 10 days. PMH: HTN, R knee arthritis, carotid artery occlusion, depression, HLD, mini stroke (July 2005)    PT Comments    Pt was long sitting in bed finishing breakfast upon arriving. She agrees to PT session and is cooperative throughout. Continues to present with severe weakness. Pt required constant assistance to exit bed, stand and ambulate 12 ft. Severe cervical spine flexion throughout. Pt at high fall risk to severity of posterior lean/push in standing. Currently recommending RN staff only stand pivot pt with +2 assistance for safety. Pt would greatly benefit form SNF at DC to address deficits while maximizing independence with ADLs. Pt was in recliner post session with chair alarm in place and call bell in reach. RN entered room at conclusion of PT session.   Follow Up Recommendations  SNF     Equipment Recommendations  Other (comment) (defer to next level of care)       Precautions / Restrictions Precautions Precautions: Fall Restrictions Weight Bearing Restrictions: No    Mobility  Bed Mobility Overal bed mobility: Needs Assistance Bed Mobility: Supine to Sit     Supine to sit: Min assist     General bed mobility comments: Increased time required, HOB elevated, HHA to move hips to EOB so feet can be supported on floor.    Transfers Overall transfer level: Needs assistance Equipment used: Rolling walker (2 wheeled) Transfers: Sit to/from Stand Sit to Stand: Mod assist;From elevated surface         General transfer comment: Pt continues to have posterior lean/push in all standing activity. recommend RN staff only stand pivot pt. Did  discuss with RN.  Ambulation/Gait Ambulation/Gait assistance: Mod assist Gait Distance (Feet): 12 Feet Assistive device: Rolling walker (2 wheeled) Gait Pattern/deviations: Trunk flexed;Shuffle;Step-to pattern;Narrow base of support Gait velocity: decreased   General Gait Details: Pt required constant mod assist to ambulate ~ 12 ft. constant posterior lean with poor standing posture. is able to so what correct with cues however unable to maintain.    Balance Overall balance assessment: Needs assistance Sitting-balance support: Feet supported;Bilateral upper extremity supported;Single extremity supported Sitting balance-Leahy Scale: Fair     Standing balance support: During functional activity;Bilateral upper extremity supported Standing balance-Leahy Scale: Poor Standing balance comment: severe posterior lean throughout all standing activity        Cognition Arousal/Alertness: Awake/alert Behavior During Therapy: WFL for tasks assessed/performed Overall Cognitive Status: No family/caregiver present to determine baseline cognitive functioning        General Comments: Pt is A x O x 2. Consistently able to follow simple one step commands         General Comments General comments (skin integrity, edema, etc.): Lengthy education and performance of ther ex to promote strengthening. pt tolerated well but does fatigue quickly. on Rm air throughout session without desaturation. discussed need to ext cervical spine to prevent contractures      Pertinent Vitals/Pain Pain Assessment: No/denies pain     PT Goals (current goals can now be found in the care plan section) Acute Rehab PT Goals Patient Stated Goal: feel better Progress towards PT goals: Progressing toward goals    Frequency  Min 2X/week      PT Plan Current plan remains appropriate       AM-PAC PT "6 Clicks" Mobility   Outcome Measure  Help needed turning from your back to your side while in a flat bed  without using bedrails?: A Lot Help needed moving from lying on your back to sitting on the side of a flat bed without using bedrails?: A Lot Help needed moving to and from a bed to a chair (including a wheelchair)?: A Lot Help needed standing up from a chair using your arms (e.g., wheelchair or bedside chair)?: A Lot Help needed to walk in hospital room?: A Lot Help needed climbing 3-5 steps with a railing? : Total 6 Click Score: 11    End of Session Equipment Utilized During Treatment: Gait belt Activity Tolerance: Patient tolerated treatment well Patient left: in chair;with call bell/phone within reach;with chair alarm set Nurse Communication: Mobility status PT Visit Diagnosis: Unsteadiness on feet (R26.81);Difficulty in walking, not elsewhere classified (R26.2);Muscle weakness (generalized) (M62.81)     Time: 3159-4585 PT Time Calculation (min) (ACUTE ONLY): 26 min  Charges:  $Gait Training: 8-22 mins $Therapeutic Activity: 8-22 mins                     Julaine Fusi PTA 07/19/20, 9:28 AM

## 2020-07-20 ENCOUNTER — Other Ambulatory Visit: Payer: Self-pay | Admitting: Internal Medicine

## 2020-07-20 ENCOUNTER — Encounter: Payer: Self-pay | Admitting: Oncology

## 2020-07-20 DIAGNOSIS — R279 Unspecified lack of coordination: Secondary | ICD-10-CM | POA: Diagnosis not present

## 2020-07-20 DIAGNOSIS — H93299 Other abnormal auditory perceptions, unspecified ear: Secondary | ICD-10-CM | POA: Diagnosis not present

## 2020-07-20 DIAGNOSIS — B9620 Unspecified Escherichia coli [E. coli] as the cause of diseases classified elsewhere: Secondary | ICD-10-CM | POA: Diagnosis not present

## 2020-07-20 DIAGNOSIS — Z170 Estrogen receptor positive status [ER+]: Secondary | ICD-10-CM

## 2020-07-20 DIAGNOSIS — R2681 Unsteadiness on feet: Secondary | ICD-10-CM | POA: Diagnosis not present

## 2020-07-20 DIAGNOSIS — N390 Urinary tract infection, site not specified: Secondary | ICD-10-CM | POA: Diagnosis not present

## 2020-07-20 DIAGNOSIS — M1990 Unspecified osteoarthritis, unspecified site: Secondary | ICD-10-CM | POA: Diagnosis not present

## 2020-07-20 DIAGNOSIS — I10 Essential (primary) hypertension: Secondary | ICD-10-CM | POA: Diagnosis not present

## 2020-07-20 DIAGNOSIS — R5381 Other malaise: Secondary | ICD-10-CM | POA: Diagnosis not present

## 2020-07-20 DIAGNOSIS — R41841 Cognitive communication deficit: Secondary | ICD-10-CM | POA: Diagnosis not present

## 2020-07-20 DIAGNOSIS — I5181 Takotsubo syndrome: Secondary | ICD-10-CM | POA: Diagnosis not present

## 2020-07-20 DIAGNOSIS — C50112 Malignant neoplasm of central portion of left female breast: Secondary | ICD-10-CM

## 2020-07-20 DIAGNOSIS — R778 Other specified abnormalities of plasma proteins: Secondary | ICD-10-CM | POA: Diagnosis not present

## 2020-07-20 DIAGNOSIS — N3000 Acute cystitis without hematuria: Secondary | ICD-10-CM | POA: Diagnosis not present

## 2020-07-20 DIAGNOSIS — D649 Anemia, unspecified: Secondary | ICD-10-CM | POA: Diagnosis not present

## 2020-07-20 DIAGNOSIS — R262 Difficulty in walking, not elsewhere classified: Secondary | ICD-10-CM | POA: Diagnosis not present

## 2020-07-20 DIAGNOSIS — M6281 Muscle weakness (generalized): Secondary | ICD-10-CM | POA: Diagnosis not present

## 2020-07-20 DIAGNOSIS — I5189 Other ill-defined heart diseases: Secondary | ICD-10-CM | POA: Diagnosis not present

## 2020-07-20 DIAGNOSIS — K219 Gastro-esophageal reflux disease without esophagitis: Secondary | ICD-10-CM | POA: Diagnosis not present

## 2020-07-20 DIAGNOSIS — U071 COVID-19: Secondary | ICD-10-CM | POA: Diagnosis not present

## 2020-07-20 MED ORDER — PANTOPRAZOLE SODIUM 40 MG PO TBEC: 40.0000 mg | DELAYED_RELEASE_TABLET | Freq: Every day | ORAL | Status: AC

## 2020-07-20 MED ORDER — CLONAZEPAM 0.5 MG PO TABS: 0.5000 mg | ORAL_TABLET | ORAL | 0 refills | Status: DC

## 2020-07-20 NOTE — Discharge Summary (Signed)
Physician Discharge Summary  [name] RJJ:[id number] DOB: 21-Jan-1924 DOA: 07/09/2020  PCP: Kelton Pillar, MD  Admit date: 07/09/2020 Discharge date: 07/20/2020  Admitted From: home Disposition:  SNF  Recommendations for Outpatient Follow-up:  Follow up with PCP in 1-2 weeks Please obtain BMP/CBC in one week  Home Health:NO  Equipment/Devices: NONE  Discharge Condition: Stable Code Status:   Code Status: DNR Diet recommendation:  Diet Order             Diet - low sodium heart healthy           Diet regular Room service appropriate? Yes; Fluid consistency: Thin  Diet effective now                    Brief/Interim Summary: 87 old female with history of hypertension presented to the ED on 6/26 with acute cystitis, generalized weakness progressive for 2 weeks prior to admission with no focal neurological deficit. She was found to have COVID-19 positive and also with UTI.  Had elevated troponin without cardiac symptoms.  She was admitted for further management. Patient was treated for E. coli cystitis/UTI transition to oral Keflex to complete 7 days course.  At this time is afebrile asymptomatic she had COVID-19 PCR positive no evidence of pneumonia no hypoxia no respiratory symptom, treated with remdesivir x3 days encourage pulmonary toileting follow-up AND continue standard isolation precaution. At this time patient is weak and deconditioned and is being discharged to skilled nursing facility for further rehabilitation.  Discharge Diagnoses:   E. coli UTI/acute cystitis: Reviewed culture sensitivity continue Keflex to complete 7 days course. COVID-19 PCR positive, overall asymptomatic no evidence of pneumonia not needing supplemental oxygen.  Completed 3 days of remdesivir.  Can come off isolation's since its been more than 10 days since onset /positive test ( discussed w / Dr Baxter Flattery). Essential hypertension continue her metoprolol Generalized weakness/fall, debility  2/2 UTI COVID-positive status overall stable continue PT OT skilled nursing facility. Chronic anemia hemoglobin is stable follow-up labs with PCP Elevated troponin-mildly elevated secondary to demand ischemia asymptomatic EKG with nonspecific T wave inversion 2D echo with normal EF no wall motion abnormalities, grade 2 DD no further cardiac work-up was recommended previously, advised outpatient follow-up History of breast cancer in remission -Continue Arimidex Consults: TOC  Subjective: Alert awake resting comfortably no chest pain, eager to be discharged today Discharge Exam: Vitals:   07/20/20 0459 07/20/20 0700  BP: (!) 112/36 (!) 137/59  Pulse: (!) 58 66  Resp: 18 18  Temp: 98.2 F (36.8 C) 98.9 F (37.2 C)  SpO2: 94% 95%   General: Pt is alert, awake, not in acute distress Cardiovascular: RRR, S1/S2 +, no rubs, no gallops Respiratory: CTA bilaterally, no wheezing, no rhonchi Abdominal: Soft, NT, ND, bowel sounds + Extremities: no edema, no cyanosis  Discharge Instructions  Discharge Instructions     Diet - low sodium heart healthy   Complete by: As directed    Discharge instructions   Complete by: As directed    Please call call MD or return to ER for similar or worsening recurring problem that brought you to hospital or if any fever,nausea/vomiting,abdominal pain, uncontrolled pain, chest pain,  shortness of breath or any other alarming symptoms.  Please follow-up your doctor as instructed in a week time and call the office for appointment.  Please avoid alcohol, smoking, or any other illicit substance and maintain healthy habits including taking your regular medications as prescribed.  You were cared  for by a hospitalist during your hospital stay. If you have any questions about your discharge medications or the care you received while you were in the hospital after you are discharged, you can call the unit and ask to speak with the hospitalist on call if the  hospitalist that took care of you is not available.  Once you are discharged, your primary care physician will handle any further medical issues. Please note that NO REFILLS for any discharge medications will be authorized once you are discharged, as it is imperative that you return to your primary care physician (or establish a relationship with a primary care physician if you do not have one) for your aftercare needs so that they can reassess your need for medications and monitor your lab values   Increase activity slowly   Complete by: As directed       Allergies as of 07/20/2020       Reactions   Fluogen [influenza Virus Vaccine]    Other reaction(s): didn't tolerate the high dose shot   Penicillins    Sulfa Antibiotics    Codeine Other (See Comments)   Penicillin G Rash   Prednisone Palpitations   Sulfamethoxazole-trimethoprim Rash        Medication List     STOP taking these medications    amLODipine 5 MG tablet Commonly known as: NORVASC   atorvastatin 20 MG tablet Commonly known as: LIPITOR   hydrochlorothiazide 12.5 MG tablet Commonly known as: HYDRODIURIL   saccharomyces boulardii 250 MG capsule Commonly known as: FLORASTOR   tiZANidine 2 MG tablet Commonly known as: ZANAFLEX   traMADol 50 MG tablet Commonly known as: ULTRAM   Vitamin D 50 MCG (2000 UT) Caps       TAKE these medications    anastrozole 1 MG tablet Commonly known as: ARIMIDEX TAKE 1 TABLET(1 MG) BY MOUTH DAILY   aspirin 81 MG tablet Take 81 mg by mouth daily.   clonazePAM 0.5 MG tablet Commonly known as: KLONOPIN Take 1 tablet (0.5 mg total) by mouth every other day for 2 doses. What changed: when to take this   metoprolol tartrate 25 MG tablet Commonly known as: LOPRESSOR Take 25 mg by mouth daily.   pantoprazole 40 MG tablet Commonly known as: PROTONIX Take 1 tablet (40 mg total) by mouth daily at 6 (six) AM. Start taking on: July 21, 2020   PARoxetine 30 MG  tablet Commonly known as: PAXIL Take 30 mg by mouth daily.   PROBIOTIC-10 PO Take 1 tablet by mouth daily.               Durable Medical Equipment  (From admission, onward)           Start     Ordered   07/11/20 0831  For home use only DME 3 n 1  Once        07/11/20 0830            Contact information for follow-up providers     Kelton Pillar, MD Follow up in 1 week(s).   Specialty: Family Medicine Why: As needed, If symptoms worsen Contact information: [address] [place] [address] [phone]              Contact information for after-discharge care     Destination     [facility] .   Service: Skilled Nursing Contact information: [address] [place]  [phone]                    Allergies  Allergen Reactions   Fluogen [Influenza Virus Vaccine]     Other reaction(s): didn't tolerate the high dose shot   Penicillins    Sulfa Antibiotics    Codeine Other (See Comments)   Penicillin G Rash   Prednisone Palpitations   Sulfamethoxazole-Trimethoprim Rash    The results of significant diagnostics from this hospitalization (including imaging, microbiology, ancillary and laboratory) are listed below for reference.    Microbiology: No results found for this or any previous visit (from the past 240 hour(s)).  Procedures/Studies: DG Elbow Complete Left  Result Date: 07/09/2020 CLINICAL DATA:  Fall.  Left elbow pain. EXAM: LEFT ELBOW - COMPLETE 3+ VIEW COMPARISON:  None. FINDINGS: There is no evidence of fracture, dislocation, or joint effusion. There is no evidence of arthropathy or other focal bone abnormality. Soft tissues are unremarkable. IMPRESSION: Negative. Electronically Signed   By: Dorise Bullion III M.D   On: 07/09/2020 17:44   CT Head Wo Contrast  Result Date: 07/09/2020 CLINICAL DATA:  [name] sometime last night. Late on  the floor till this afternoon. EXAM: CT HEAD WITHOUT CONTRAST TECHNIQUE: Contiguous axial images were obtained from the base of the skull through the vertex without intravenous contrast. COMPARISON:  Head CT, 04/10/2020. FINDINGS: Brain: Choose 1 There is age appropriate ventricular and sulcal enlargement. Small area of encephalomalacia noted along the lateral right frontal lobe consistent with an old infarct, stable from the prior head CT. Bilateral white matter hypoattenuation is noted, also stable consistent with moderate chronic microvascular ischemic change. Vascular: No hyperdense vessel or unexpected calcification. Skull: Normal. Negative for fracture or focal lesion. Sinuses/Orbits: Visualized globes and orbits are unremarkable. There is dependent fluid in the right maxillary sinus, mild-to-moderate mucosal thickening lining the ethmoid air cells, mild right anterior sphenoid and inferior frontal sinus mucosal thickening. Mastoid air cells are clear. Other: None. IMPRESSION: 1. No acute intracranial abnormalities. 2. 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 3. Sinus disease as described including a right maxillary sinus air-fluid level. Electronically Signed   By: Lajean Manes M.D.   On: 07/09/2020 15:56   CT Angio Chest Pulmonary Embolism (PE) W or WO Contrast  Result Date: 07/10/2020 CLINICAL DATA:  Elevated D-dimer. Nonproductive cough for 1 week. Clinical suspicion for pulmonary embolism. EXAM: CT ANGIOGRAPHY CHEST WITH CONTRAST TECHNIQUE: Multidetector CT imaging of the chest was performed using the standard protocol during bolus administration of intravenous contrast. Multiplanar CT image reconstructions and MIPs were obtained to evaluate the vascular anatomy. CONTRAST:  59mL OMNIPAQUE IOHEXOL 350 MG/ML SOLN COMPARISON:  None. FINDINGS: Cardiovascular: Satisfactory opacification of pulmonary arteries noted, however evaluation  of peripheral pulmonary arteries in the lung bases is limited by bibasilar atelectasis. No pulmonary emboli are identified. No evidence of thoracic aortic dissection or aneurysm. Aortic and coronary atherosclerotic calcification noted. Mediastinum/Nodes: No masses or pathologically enlarged lymph nodes identified. Lungs/Pleura: Atelectasis is noted in both lung bases. Mild atelectasis or scarring is also seen in the anterior right upper lobe. No pulmonary mass, infiltrate, or effusion. Upper abdomen: No acute findings. Musculoskeletal: No suspicious bone lesions identified. An old wedge compression fracture of the T6 vertebral body is noted. Review of the MIP images confirms the above findings. IMPRESSION: No evidence of pulmonary embolism. Bilateral subsegmental atelectasis. Aortic Atherosclerosis (ICD10-I70.0). Electronically Signed   By: Myles Rosenthal.D.  On: 07/10/2020 11:35   US Venous Img Lower Bilateral  Result Date: 07/09/2020 CLINICAL DATA:  Leg swelling, evaluate for DVT EXAM: BILATERAL LOWER EXTREMITY VENOUS DOPPLER ULTRASOUND TECHNIQUE: Gray-scale sonography with compression, as well as color and duplex ultrasound, were performed to evaluate the deep venous system(s) from the level of the common femoral vein through the popliteal and proximal calf veins. COMPARISON:  None. FINDINGS: VENOUS Normal compressibility of the common femoral, superficial femoral, and popliteal veins, as well as the visualized calf veins. Visualized portions of profunda femoral vein and great saphenous vein unremarkable. No filling defects to suggest DVT on grayscale or color Doppler imaging. Doppler waveforms show normal direction of venous flow, normal respiratory plasticity and response to augmentation. Limited views of the contralateral common femoral vein are unremarkable. OTHER None. Limitations: none IMPRESSION: Negative examination for deep venous thrombosis in the bilateral lower extremities. Electronically Signed    By: Eddie Candle M.D.   On: 07/09/2020 18:41   DG Chest Portable 1 View  Result Date: 07/09/2020 CLINICAL DATA:  Fall.  Left hip pain. EXAM: PORTABLE CHEST 1 VIEW COMPARISON:  March 13, 2019 FINDINGS: Cardiomediastinal silhouette is stable. Stable mild cardiomegaly. Stable tortuous thoracic aorta. No pneumothorax. No nodules or masses. No focal infiltrates. No acute abnormalities. IMPRESSION: No active disease. Electronically Signed   By: Dorise Bullion III M.D   On: 07/09/2020 16:21   ECHOCARDIOGRAM COMPLETE  Result Date: 07/10/2020    ECHOCARDIOGRAM REPORT   Patient Name:   [name] Date of Exam: 07/10/2020 Medical Rec #:  [id number]        Height:       65.0 in Accession #:    [id number]       Weight:       159.8 lb Date of Birth:  Feb 16, 1924       BSA:          1.798 m Patient Age:    61 years         BP:           107/58 mmHg Patient Gender: F                HR:           70 bpm. Exam Location:  [facility] Procedure: 2D Echo, Cardiac Doppler and Color Doppler Indications:     Elevtated troponin  History:         Patient has no prior history of Echocardiogram examinations.                  Stroke; Risk Factors:Hypertension.  Sonographer:     Sherrie Sport RDCS (AE) Referring Phys:  [id number] Rhetta Mura Diagnosing Phys: Kathlyn Sacramento MD IMPRESSIONS  1. Left ventricular ejection fraction, by estimation, is 60 to 65%. The left ventricle has normal function. The left ventricle has no regional wall motion abnormalities. There is mild left ventricular hypertrophy. Left ventricular diastolic parameters are consistent with Grade II diastolic dysfunction (pseudonormalization).  2. Right ventricular systolic function is normal. The right ventricular size is normal. There is moderately elevated pulmonary artery systolic pressure. The estimated right ventricular systolic pressure is 72.5 mmHg.  3. Left atrial size was moderately dilated.  4. The mitral valve is normal in structure. Mild to moderate mitral  valve regurgitation. No evidence of mitral stenosis. Moderate mitral annular calcification.  5. Tricuspid valve regurgitation is moderate.  6. The aortic valve is normal in structure. Aortic valve regurgitation is mild. Mild aortic  valve stenosis. Aortic valve area, by VTI measures 1.87 cm. Aortic valve mean gradient measures 8.0 mmHg. FINDINGS  Left Ventricle: Left ventricular ejection fraction, by estimation, is 60 to 65%. The left ventricle has normal function. The left ventricle has no regional wall motion abnormalities. The left ventricular internal cavity size was normal in size. There is  mild left ventricular hypertrophy. Left ventricular diastolic parameters are consistent with Grade II diastolic dysfunction (pseudonormalization). Right Ventricle: The right ventricular size is normal. No increase in right ventricular wall thickness. Right ventricular systolic function is normal. There is moderately elevated pulmonary artery systolic pressure. The tricuspid regurgitant velocity is 3.52 m/s, and with an assumed right atrial pressure of 10 mmHg, the estimated right ventricular systolic pressure is 02.7 mmHg. Left Atrium: Left atrial size was moderately dilated. Right Atrium: Right atrial size was normal in size. Pericardium: There is no evidence of pericardial effusion. Mitral Valve: The mitral valve is normal in structure. Moderate mitral annular calcification. Mild to moderate mitral valve regurgitation. No evidence of mitral valve stenosis. Tricuspid Valve: The tricuspid valve is normal in structure. Tricuspid valve regurgitation is moderate . No evidence of tricuspid stenosis. Aortic Valve: The aortic valve is normal in structure. Aortic valve regurgitation is mild. Mild aortic stenosis is present. Aortic valve mean gradient measures 8.0 mmHg. Aortic valve peak gradient measures 14.6 mmHg. Aortic valve area, by VTI measures 1.87 cm. Pulmonic Valve: The pulmonic valve was normal in structure. Pulmonic  valve regurgitation is mild. No evidence of pulmonic stenosis. Aorta: The aortic root is normal in size and structure. Venous: The inferior vena cava was not well visualized. IAS/Shunts: No atrial level shunt detected by color flow Doppler.  LEFT VENTRICLE PLAX 2D LVIDd:         4.14 cm  Diastology LVIDs:         2.71 cm  LV e' medial:    5.11 cm/s LV PW:         1.18 cm  LV E/e' medial:  20.9 LV IVS:        1.23 cm  LV e' lateral:   4.79 cm/s LVOT diam:     2.00 cm  LV E/e' lateral: 22.3 LV SV:         63 LV SV Index:   35 LVOT Area:     3.14 cm  RIGHT VENTRICLE RV S prime:     13.70 cm/s TAPSE (M-mode): 3.5 cm LEFT ATRIUM            Index       RIGHT ATRIUM           Index LA diam:      3.90 cm  2.17 cm/m  RA Area:     17.30 cm LA Vol (A2C): 164.0 ml 91.19 ml/m RA Volume:   53.40 ml  29.69 ml/m LA Vol (A4C): 60.1 ml  33.42 ml/m  AORTIC VALVE                    PULMONIC VALVE AV Area (Vmax):    1.63 cm     PV Vmax:        0.74 m/s AV Area (Vmean):   1.68 cm     PV Peak grad:   2.2 mmHg AV Area (VTI):     1.87 cm     RVOT Peak grad: 3 mmHg AV Vmax:           191.33 cm/s AV Vmean:  129.000 cm/s AV VTI:            0.339 m AV Peak Grad:      14.6 mmHg AV Mean Grad:      8.0 mmHg LVOT Vmax:         99.20 cm/s LVOT Vmean:        69.100 cm/s LVOT VTI:          0.202 m LVOT/AV VTI ratio: 0.60  AORTA Ao Root diam: 3.07 cm MITRAL VALVE                TRICUSPID VALVE MV Area (PHT): 3.36 cm     TR Peak grad:   49.6 mmHg MV Decel Time: 226 msec     TR Vmax:        352.00 cm/s MV E velocity: 107.00 cm/s MV A velocity: 126.00 cm/s  SHUNTS MV E/A ratio:  0.85         Systemic VTI:  0.20 m                             Systemic Diam: 2.00 cm Kathlyn Sacramento MD Electronically signed by Kathlyn Sacramento MD Signature Date/Time: 07/10/2020/11:21:50 AM    Final    DG Hip Unilat W or Wo Pelvis 2-3 Views Left  Result Date: 07/09/2020 CLINICAL DATA:  Pain after fall EXAM: DG HIP (WITH OR WITHOUT PELVIS) 2-3V LEFT  COMPARISON:  None. FINDINGS: Mild irregularity along the lateral aspect of the left femoral head is likely a small osteophyte. No fracture or dislocation. Severe degenerative changes in the right hip with near complete loss of joint space and bony sclerosis. No other acute abnormalities. IMPRESSION: No acute fractures. Electronically Signed   By: Dorise Bullion III M.D   On: 07/09/2020 16:20    Labs: BNP (last 3 results) Recent Labs    07/09/20 1505  BNP 26.9   Basic Metabolic Panel: Recent Labs  Lab 07/14/20 0554 07/15/20 0440 07/18/20 0604  NA 138 134* 135  K 3.9 4.0 4.7  CL 108 103 104  CO2 25 26 27   GLUCOSE 110* 105* 105*  BUN 20 19 31*  CREATININE 0.82 1.14* 0.96  CALCIUM 8.7* 8.2* 8.9  MG 1.7 1.7  --   PHOS 3.3 3.7  --    Liver Function Tests: Recent Labs  Lab 07/14/20 0554 07/15/20 0440  AST 23 27  ALT 16 16  ALKPHOS 50 62  BILITOT 0.8 0.9  PROT 6.1* 6.0*  ALBUMIN 2.8* 2.9*   No results for input(s): LIPASE, AMYLASE in the last 168 hours. No results for input(s): AMMONIA in the last 168 hours. CBC: Recent Labs  Lab 07/14/20 0554 07/15/20 0440 07/18/20 0604  WBC 6.5 7.5 7.3  NEUTROABS 3.6 4.6 4.8  HGB 8.3* 8.7* 8.3*  HCT 24.7* 25.1* 25.3*  MCV 98.0 99.2 104.5*  PLT 226 255 267   Cardiac Enzymes: No results for input(s): CKTOTAL, CKMB, CKMBINDEX, TROPONINI in the last 168 hours. BNP: Invalid input(s): POCBNP CBG: Recent Labs  Lab 07/14/20 1258  GLUCAP 110*   D-Dimer No results for input(s): DDIMER in the last 72 hours. Hgb A1c No results for input(s): HGBA1C in the last 72 hours. Lipid Profile No results for input(s): CHOL, HDL, LDLCALC, TRIG, CHOLHDL, LDLDIRECT in the last 72 hours. Thyroid function studies No results for input(s): TSH, T4TOTAL, T3FREE, THYROIDAB in the last 72 hours.  Invalid input(s): FREET3 Anemia work up No  results for input(s): VITAMINB12, FOLATE, FERRITIN, TIBC, IRON, RETICCTPCT in the last 72 hours. Urinalysis     Component Value Date/Time   COLORURINE YELLOW (A) 07/09/2020 1711   APPEARANCEUR CLOUDY (A) 07/09/2020 1711   LABSPEC 1.015 07/09/2020 1711   PHURINE 5.0 07/09/2020 1711   GLUCOSEU NEGATIVE 07/09/2020 1711   HGBUR SMALL (A) 07/09/2020 1711   BILIRUBINUR NEGATIVE 07/09/2020 1711   KETONESUR NEGATIVE 07/09/2020 1711   PROTEINUR NEGATIVE 07/09/2020 1711   NITRITE POSITIVE (A) 07/09/2020 1711   LEUKOCYTESUR LARGE (A) 07/09/2020 1711   Sepsis Labs Invalid input(s): PROCALCITONIN,  WBC,  LACTICIDVEN Microbiology No results found for this or any previous visit (from the past 240 hour(s)).   Time coordinating discharge: 25 minutes  SIGNED: Antonieta Pert, MD  [facility] 07/20/2020, 9:34 AM  If 7PM-7AM, please contact night-coverage [email or web address]

## 2020-07-20 NOTE — Progress Notes (Signed)
Report given to facility nurse Joy Cox, pt to transport via EMS. NAD noted prior to discharge.

## 2020-07-20 NOTE — Progress Notes (Signed)
Report given to Earleen Newport, RN  at [facility].

## 2020-07-20 NOTE — TOC Transition Note (Signed)
Transition of Care [facility]) - CM/SW Discharge Note   Patient Details  Name: [name] MRN: [id number] Date of Birth: 19-Feb-1924  [facility] CM/SW Contact:  Beverly Sessions, RN Phone Number: 07/20/2020, 10:04 AM   Clinical Narrative:    Received call from Colletta Maryland at [facility] approved for [facility] auth number [id number]  AO information sent in the hub  Daughter updated  Bedside RN to call report   EMS transport arranged for 2 pm through [facility]  Auth # for [id number]  EMS packet placed on chart     Final next level of care: Skilled Nursing Facility Barriers to Discharge: No Barriers Identified   Patient Goals and CMS Choice        Discharge Placement              Patient chooses bed at: Other - please specify in the comment section below: ([facility]) Patient to be transferred to facility by: EMS Name of family member notified: [name] Patient and family notified of of transfer: 07/20/20  Discharge Plan and Services   Discharge Planning Services: CM Consult                                 Social Determinants of Health (SDOH) Interventions     Readmission Risk Interventions No flowsheet data found.

## 2020-07-20 NOTE — Progress Notes (Signed)
Referral placed for outpatient palliative care referral- unable to complete inpatient consultation prior to discharge.

## 2020-07-20 NOTE — Progress Notes (Signed)
Attempted to call report to [facility] and nurse was unavailable at this time. My contact info was provided and awaiting return call from nurse.

## 2020-07-24 DIAGNOSIS — U071 COVID-19: Secondary | ICD-10-CM | POA: Diagnosis not present

## 2020-07-24 DIAGNOSIS — I10 Essential (primary) hypertension: Secondary | ICD-10-CM | POA: Diagnosis not present

## 2020-07-24 DIAGNOSIS — R5381 Other malaise: Secondary | ICD-10-CM | POA: Diagnosis not present

## 2020-07-24 DIAGNOSIS — N390 Urinary tract infection, site not specified: Secondary | ICD-10-CM | POA: Diagnosis not present

## 2020-07-24 DIAGNOSIS — D649 Anemia, unspecified: Secondary | ICD-10-CM | POA: Diagnosis not present

## 2020-07-24 DIAGNOSIS — I5181 Takotsubo syndrome: Secondary | ICD-10-CM | POA: Diagnosis not present

## 2020-07-24 DIAGNOSIS — K219 Gastro-esophageal reflux disease without esophagitis: Secondary | ICD-10-CM | POA: Diagnosis not present

## 2020-07-24 DIAGNOSIS — B9620 Unspecified Escherichia coli [E. coli] as the cause of diseases classified elsewhere: Secondary | ICD-10-CM | POA: Diagnosis not present

## 2020-07-24 DIAGNOSIS — R778 Other specified abnormalities of plasma proteins: Secondary | ICD-10-CM | POA: Diagnosis not present

## 2020-07-26 ENCOUNTER — Telehealth: Payer: Self-pay

## 2020-07-26 NOTE — Telephone Encounter (Signed)
Progress notes on file from; [facility] and [place], [place] [phone]. A copy of the referral was sent to scheduling for appt

## 2020-07-27 ENCOUNTER — Ambulatory Visit: Payer: PPO | Admitting: Podiatry

## 2020-07-31 DIAGNOSIS — N390 Urinary tract infection, site not specified: Secondary | ICD-10-CM | POA: Diagnosis not present

## 2020-07-31 DIAGNOSIS — K219 Gastro-esophageal reflux disease without esophagitis: Secondary | ICD-10-CM | POA: Diagnosis not present

## 2020-07-31 DIAGNOSIS — B9620 Unspecified Escherichia coli [E. coli] as the cause of diseases classified elsewhere: Secondary | ICD-10-CM | POA: Diagnosis not present

## 2020-07-31 DIAGNOSIS — M1990 Unspecified osteoarthritis, unspecified site: Secondary | ICD-10-CM | POA: Diagnosis not present

## 2020-07-31 DIAGNOSIS — I10 Essential (primary) hypertension: Secondary | ICD-10-CM | POA: Diagnosis not present

## 2020-07-31 DIAGNOSIS — U071 COVID-19: Secondary | ICD-10-CM | POA: Diagnosis not present

## 2020-08-09 DIAGNOSIS — K219 Gastro-esophageal reflux disease without esophagitis: Secondary | ICD-10-CM | POA: Diagnosis not present

## 2020-08-09 DIAGNOSIS — M1990 Unspecified osteoarthritis, unspecified site: Secondary | ICD-10-CM | POA: Diagnosis not present

## 2020-08-09 DIAGNOSIS — R5381 Other malaise: Secondary | ICD-10-CM | POA: Diagnosis not present

## 2020-08-09 DIAGNOSIS — R778 Other specified abnormalities of plasma proteins: Secondary | ICD-10-CM | POA: Diagnosis not present

## 2020-08-09 DIAGNOSIS — D649 Anemia, unspecified: Secondary | ICD-10-CM | POA: Diagnosis not present

## 2020-08-09 DIAGNOSIS — U071 COVID-19: Secondary | ICD-10-CM | POA: Diagnosis not present

## 2020-08-09 DIAGNOSIS — I5189 Other ill-defined heart diseases: Secondary | ICD-10-CM | POA: Diagnosis not present

## 2020-08-14 DIAGNOSIS — F0390 Unspecified dementia without behavioral disturbance: Secondary | ICD-10-CM | POA: Diagnosis not present

## 2020-08-14 DIAGNOSIS — U071 COVID-19: Secondary | ICD-10-CM | POA: Diagnosis not present

## 2020-08-14 DIAGNOSIS — R2681 Unsteadiness on feet: Secondary | ICD-10-CM | POA: Diagnosis not present

## 2020-08-14 DIAGNOSIS — R5381 Other malaise: Secondary | ICD-10-CM | POA: Diagnosis not present

## 2020-08-14 DIAGNOSIS — K219 Gastro-esophageal reflux disease without esophagitis: Secondary | ICD-10-CM | POA: Diagnosis not present

## 2020-08-14 DIAGNOSIS — M1990 Unspecified osteoarthritis, unspecified site: Secondary | ICD-10-CM | POA: Diagnosis not present

## 2020-08-14 DIAGNOSIS — M6281 Muscle weakness (generalized): Secondary | ICD-10-CM | POA: Diagnosis not present

## 2020-08-14 DIAGNOSIS — R41841 Cognitive communication deficit: Secondary | ICD-10-CM | POA: Diagnosis not present

## 2020-08-14 DIAGNOSIS — D649 Anemia, unspecified: Secondary | ICD-10-CM | POA: Diagnosis not present

## 2020-08-14 DIAGNOSIS — R262 Difficulty in walking, not elsewhere classified: Secondary | ICD-10-CM | POA: Diagnosis not present

## 2020-08-17 DIAGNOSIS — D649 Anemia, unspecified: Secondary | ICD-10-CM | POA: Diagnosis not present

## 2020-08-17 DIAGNOSIS — R5381 Other malaise: Secondary | ICD-10-CM | POA: Diagnosis not present

## 2020-08-17 DIAGNOSIS — U071 COVID-19: Secondary | ICD-10-CM | POA: Diagnosis not present

## 2020-08-17 DIAGNOSIS — I10 Essential (primary) hypertension: Secondary | ICD-10-CM | POA: Diagnosis not present

## 2020-08-17 DIAGNOSIS — M1990 Unspecified osteoarthritis, unspecified site: Secondary | ICD-10-CM | POA: Diagnosis not present

## 2020-08-17 DIAGNOSIS — K219 Gastro-esophageal reflux disease without esophagitis: Secondary | ICD-10-CM | POA: Diagnosis not present

## 2020-08-17 DIAGNOSIS — R778 Other specified abnormalities of plasma proteins: Secondary | ICD-10-CM | POA: Diagnosis not present

## 2020-08-22 DIAGNOSIS — I129 Hypertensive chronic kidney disease with stage 1 through stage 4 chronic kidney disease, or unspecified chronic kidney disease: Secondary | ICD-10-CM | POA: Diagnosis not present

## 2020-08-28 DIAGNOSIS — I129 Hypertensive chronic kidney disease with stage 1 through stage 4 chronic kidney disease, or unspecified chronic kidney disease: Secondary | ICD-10-CM | POA: Diagnosis not present

## 2020-08-28 DIAGNOSIS — D649 Anemia, unspecified: Secondary | ICD-10-CM | POA: Diagnosis not present

## 2020-09-11 DIAGNOSIS — K219 Gastro-esophageal reflux disease without esophagitis: Secondary | ICD-10-CM | POA: Diagnosis not present

## 2020-09-11 DIAGNOSIS — I5189 Other ill-defined heart diseases: Secondary | ICD-10-CM | POA: Diagnosis not present

## 2020-09-11 DIAGNOSIS — I10 Essential (primary) hypertension: Secondary | ICD-10-CM | POA: Diagnosis not present

## 2020-09-11 DIAGNOSIS — M1990 Unspecified osteoarthritis, unspecified site: Secondary | ICD-10-CM | POA: Diagnosis not present

## 2020-09-11 DIAGNOSIS — D649 Anemia, unspecified: Secondary | ICD-10-CM | POA: Diagnosis not present

## 2020-09-13 DIAGNOSIS — D649 Anemia, unspecified: Secondary | ICD-10-CM | POA: Diagnosis not present

## 2020-09-14 DIAGNOSIS — K219 Gastro-esophageal reflux disease without esophagitis: Secondary | ICD-10-CM | POA: Diagnosis not present

## 2020-09-14 DIAGNOSIS — M1990 Unspecified osteoarthritis, unspecified site: Secondary | ICD-10-CM | POA: Diagnosis not present

## 2020-09-14 DIAGNOSIS — D649 Anemia, unspecified: Secondary | ICD-10-CM | POA: Diagnosis not present

## 2020-09-14 DIAGNOSIS — B356 Tinea cruris: Secondary | ICD-10-CM | POA: Diagnosis not present

## 2020-09-14 DIAGNOSIS — I10 Essential (primary) hypertension: Secondary | ICD-10-CM | POA: Diagnosis not present

## 2020-09-20 DIAGNOSIS — B379 Candidiasis, unspecified: Secondary | ICD-10-CM | POA: Diagnosis not present

## 2020-09-20 DIAGNOSIS — D649 Anemia, unspecified: Secondary | ICD-10-CM | POA: Diagnosis not present

## 2020-09-20 DIAGNOSIS — I10 Essential (primary) hypertension: Secondary | ICD-10-CM | POA: Diagnosis not present

## 2020-09-20 DIAGNOSIS — K219 Gastro-esophageal reflux disease without esophagitis: Secondary | ICD-10-CM | POA: Diagnosis not present

## 2020-09-20 DIAGNOSIS — M1990 Unspecified osteoarthritis, unspecified site: Secondary | ICD-10-CM | POA: Diagnosis not present

## 2020-09-26 DIAGNOSIS — L853 Xerosis cutis: Secondary | ICD-10-CM | POA: Diagnosis not present

## 2020-09-26 DIAGNOSIS — L821 Other seborrheic keratosis: Secondary | ICD-10-CM | POA: Diagnosis not present

## 2020-10-02 ENCOUNTER — Encounter: Payer: Self-pay | Admitting: Oncology

## 2020-10-02 ENCOUNTER — Other Ambulatory Visit: Payer: PPO

## 2020-10-02 ENCOUNTER — Ambulatory Visit
Admission: RE | Admit: 2020-10-02 | Discharge: 2020-10-02 | Disposition: A | Discharge status: Home / Self Care | Payer: PPO | Source: Ambulatory Visit | Attending: Oncology | Admitting: Oncology

## 2020-10-02 ENCOUNTER — Ambulatory Visit: Payer: PPO

## 2020-10-02 ENCOUNTER — Other Ambulatory Visit: Payer: Self-pay

## 2020-10-02 DIAGNOSIS — R922 Inconclusive mammogram: Secondary | ICD-10-CM | POA: Diagnosis not present

## 2020-10-02 DIAGNOSIS — C50112 Malignant neoplasm of central portion of left female breast: Secondary | ICD-10-CM

## 2020-10-02 DIAGNOSIS — Z170 Estrogen receptor positive status [ER+]: Secondary | ICD-10-CM

## 2020-10-04 ENCOUNTER — Other Ambulatory Visit: Payer: Self-pay

## 2020-10-04 ENCOUNTER — Encounter (HOSPITAL_BASED_OUTPATIENT_CLINIC_OR_DEPARTMENT_OTHER): Payer: Self-pay | Admitting: *Deleted

## 2020-10-04 ENCOUNTER — Emergency Department (HOSPITAL_BASED_OUTPATIENT_CLINIC_OR_DEPARTMENT_OTHER)
Admission: EM | Admit: 2020-10-04 | Discharge: 2020-10-04 | Disposition: A | Discharge status: Home / Self Care | Payer: PPO | Attending: Emergency Medicine | Admitting: Emergency Medicine

## 2020-10-04 DIAGNOSIS — T364X1A Poisoning by tetracyclines, accidental (unintentional), initial encounter: Secondary | ICD-10-CM | POA: Diagnosis not present

## 2020-10-04 DIAGNOSIS — Z8616 Personal history of COVID-19: Secondary | ICD-10-CM | POA: Diagnosis not present

## 2020-10-04 DIAGNOSIS — T424X1A Poisoning by benzodiazepines, accidental (unintentional), initial encounter: Secondary | ICD-10-CM | POA: Diagnosis not present

## 2020-10-04 DIAGNOSIS — N183 Chronic kidney disease, stage 3 unspecified: Secondary | ICD-10-CM | POA: Insufficient documentation

## 2020-10-04 DIAGNOSIS — I10 Essential (primary) hypertension: Secondary | ICD-10-CM | POA: Diagnosis not present

## 2020-10-04 DIAGNOSIS — Z7982 Long term (current) use of aspirin: Secondary | ICD-10-CM | POA: Diagnosis not present

## 2020-10-04 DIAGNOSIS — R0902 Hypoxemia: Secondary | ICD-10-CM | POA: Diagnosis not present

## 2020-10-04 DIAGNOSIS — R001 Bradycardia, unspecified: Secondary | ICD-10-CM | POA: Diagnosis not present

## 2020-10-04 DIAGNOSIS — F0390 Unspecified dementia without behavioral disturbance: Secondary | ICD-10-CM | POA: Insufficient documentation

## 2020-10-04 DIAGNOSIS — I5189 Other ill-defined heart diseases: Secondary | ICD-10-CM | POA: Diagnosis not present

## 2020-10-04 DIAGNOSIS — Z79899 Other long term (current) drug therapy: Secondary | ICD-10-CM | POA: Diagnosis not present

## 2020-10-04 DIAGNOSIS — T502X1A Poisoning by carbonic-anhydrase inhibitors, benzothiadiazides and other diuretics, accidental (unintentional), initial encounter: Secondary | ICD-10-CM | POA: Diagnosis not present

## 2020-10-04 DIAGNOSIS — M1990 Unspecified osteoarthritis, unspecified site: Secondary | ICD-10-CM | POA: Diagnosis not present

## 2020-10-04 DIAGNOSIS — D649 Anemia, unspecified: Secondary | ICD-10-CM | POA: Diagnosis not present

## 2020-10-04 DIAGNOSIS — I499 Cardiac arrhythmia, unspecified: Secondary | ICD-10-CM | POA: Diagnosis not present

## 2020-10-04 DIAGNOSIS — Z853 Personal history of malignant neoplasm of breast: Secondary | ICD-10-CM | POA: Insufficient documentation

## 2020-10-04 DIAGNOSIS — I129 Hypertensive chronic kidney disease with stage 1 through stage 4 chronic kidney disease, or unspecified chronic kidney disease: Secondary | ICD-10-CM | POA: Diagnosis not present

## 2020-10-04 DIAGNOSIS — Z87891 Personal history of nicotine dependence: Secondary | ICD-10-CM | POA: Insufficient documentation

## 2020-10-04 DIAGNOSIS — I959 Hypotension, unspecified: Secondary | ICD-10-CM | POA: Diagnosis not present

## 2020-10-04 DIAGNOSIS — F418 Other specified anxiety disorders: Secondary | ICD-10-CM | POA: Diagnosis not present

## 2020-10-04 DIAGNOSIS — T50901A Poisoning by unspecified drugs, medicaments and biological substances, accidental (unintentional), initial encounter: Secondary | ICD-10-CM

## 2020-10-04 DIAGNOSIS — K219 Gastro-esophageal reflux disease without esophagitis: Secondary | ICD-10-CM | POA: Diagnosis not present

## 2020-10-04 NOTE — ED Provider Notes (Signed)
[name] Provider Note   CSN: [id number] Arrival date & time: 10/04/20  1705     History Chief Complaint  Patient presents with   Wrong Meds given    [name] is a 87 y.o. female.  HPI  87 year old female with medical history below who presents to the emergency department with accidental drug ingestion.  Earlier this morning a medication error occurred at the patient's [name].  Unfortunately between 830 and 930 this morning she was accidentally administered vitamin D3 25 MCG, gabapentin 300 mg, Hiprex 1 g, Lasix 20 mg, Wellbutrin 150 mg, calcium 600 with vitamin D3 110 MCG, coenzyme Q10 100 mg, Colace 100 mg, diltiazem 240 mg.  Facility staff had been checking her vitals every 15 minutes and observing her mental status and she had been asymptomatic at the facility.  Her daughter was concerned and wanted her to be evaluated in the emergency department.  Since then, she denies lightheadedness, chest pain or shortness of breath, change in mental status.  No seizure-like activity.  Past Medical History:  Diagnosis Date   Arthritis 04/13/09   Right knee   Carotid artery occlusion July 2005   Depression    Hyperlipidemia    Hypertension    Stroke [facility]) July 2005   Mini    Patient Active Problem List   Diagnosis Date Noted   Generalized weakness 07/10/2020   Fall at home, initial encounter 07/10/2020   COVID-19 virus infection 07/10/2020   Lower extremity edema 07/10/2020   Elevated troponin 07/10/2020   Hypertension    Anemia    Acute cystitis 07/09/2020   Stage 3 chronic kidney disease ([place]) 04/22/2020   Prediabetes 04/22/2020   Personal history of transient ischemic attack (TIA), and cerebral infarction without residual deficits 04/22/2020   Osteoarthritis 04/22/2020   Mixed hyperlipidemia 04/22/2020   Malnutrition ([place]) 04/22/2020   Major depression, single episode 04/22/2020   Edema 04/22/2020    Disorder of arteries and arterioles, unspecified ([place]) 04/22/2020   Dementia ([place]) 04/22/2020   Genetic testing 11/13/2018   Malignant neoplasm of central portion of left breast in female, estrogen receptor positive ([place]) 10/26/2018   Osteopenia of lower leg 10/26/2018   Neck pain 05/05/2015   Occlusion and stenosis of carotid artery without mention of cerebral infarction 05/27/2012    Past Surgical History:  Procedure Laterality Date   CATARACT EXTRACTION W/ INTRAOCULAR LENS  IMPLANT, BILATERAL       OB History   No obstetric history on file.     Family History  Problem Relation Age of Onset   Heart disease Brother    Coronary artery disease Brother    Heart disease Brother    Heart disease Brother    Ovarian cancer Daughter 40       Died at 42   Breast cancer Neg Hx     Social History   Tobacco Use   Smoking status: Never   Smokeless tobacco: Former    Types: Snuff  Vaping Use   Vaping Use: Never used  Substance Use Topics   Alcohol use: No    Alcohol/week: 0.0 standard drinks   Drug use: No    Home Medications Prior to Admission medications   Medication Sig Start Date End Date Taking? Authorizing Provider  anastrozole (ARIMIDEX) 1 MG tablet TAKE 1 TABLET(1 MG) BY MOUTH DAILY 01/05/20   Magrinat, Virgie Dad, MD  aspirin 81 MG tablet Take 81 mg by mouth daily.  [provider]  clonazePAM (KLONOPIN) 0.5 MG tablet Take 1 tablet (0.5 mg total) by mouth every other day for 2 doses. 07/20/20 07/23/20  Antonieta Pert, MD  metoprolol tartrate (LOPRESSOR) 25 MG tablet Take 25 mg by mouth daily.  06/12/17   [provider]  pantoprazole (PROTONIX) 40 MG tablet Take 1 tablet (40 mg total) by mouth daily at 6 (six) AM. 07/21/20   Antonieta Pert, MD  PARoxetine (PAXIL) 30 MG tablet Take 30 mg by mouth daily.     [provider]  Probiotic Product (PROBIOTIC-10 PO) Take 1 tablet by mouth daily.    [provider]    Allergies    Fluogen [influenza  virus vaccine], Penicillins, Sulfa antibiotics, Codeine, Penicillin g, Prednisone, and Sulfamethoxazole-trimethoprim  Review of Systems   Review of Systems  Constitutional:  Negative for chills and fever.  HENT:  Negative for ear pain and sore throat.   Eyes:  Negative for pain and visual disturbance.  Respiratory:  Negative for cough and shortness of breath.   Cardiovascular:  Negative for chest pain and palpitations.  Gastrointestinal:  Negative for abdominal pain and vomiting.  Genitourinary:  Negative for dysuria and hematuria.  Musculoskeletal:  Negative for arthralgias and back pain.  Skin:  Negative for color change and rash.  Neurological:  Negative for seizures and syncope.  All other systems reviewed and are negative.  Physical Exam Updated Vital Signs BP (!) 162/62   Pulse 75   Temp 98.4 F (36.9 C) (Oral)   Resp 17   Ht 5\' 3"  (1.6 m)   Wt 70.8 kg   SpO2 94%   BMI 27.63 kg/m   Physical Exam Vitals and nursing note reviewed.  Constitutional:      General: She is not in acute distress. HENT:     Head: Normocephalic and atraumatic.  Eyes:     Conjunctiva/sclera: Conjunctivae normal.     Pupils: Pupils are equal, round, and reactive to light.  Cardiovascular:     Rate and Rhythm: Regular rhythm. Bradycardia present.     Pulses: Normal pulses.  Pulmonary:     Effort: Pulmonary effort is normal. No respiratory distress.     Breath sounds: Normal breath sounds.  Abdominal:     General: There is no distension.     Tenderness: There is no guarding.  Musculoskeletal:        General: No deformity or signs of injury.     Cervical back: Normal range of motion and neck supple.  Skin:    Findings: No lesion or rash.  Neurological:     General: No focal deficit present.     Mental Status: She is alert. Mental status is at baseline.    ED Results / Procedures / Treatments   Labs (all labs ordered are listed, but only abnormal results are displayed) Labs Reviewed  - No data to display  EKG None  Radiology No results found.  Procedures Procedures   Medications Ordered in ED Medications - No data to display  ED Course  I have reviewed the triage vital signs and the nursing notes.  Pertinent labs & imaging results that were available during my care of the patient were reviewed by me and considered in my medical decision making (see chart for details).    MDM Rules/Calculators/A&P                           87 year old female with medical  history below who presents to the emergency department with accidental drug ingestion.  Earlier this morning a medication error occurred at the patient's [place].  Unfortunately between 830 and 930 this morning she was accidentally administered vitamin D3 25 MCG, gabapentin 300 mg, Hiprex 1 g, Lasix 20 mg, Wellbutrin 150 mg, calcium 600 with vitamin D3 110 MCG, coenzyme Q10 100 mg, Colace 100 mg, diltiazem 240 mg.  Facility staff had been checking her vitals every 15 minutes and observing her mental status and she had been asymptomatic at the facility.  Her daughter was concerned and wanted her to be evaluated in the emergency department.  Since then, she denies lightheadedness, chest pain or shortness of breath, change in mental status.  No seizure-like activity.  On arrival, the patient was afebrile, mildly hypertensive BP 160/62, initially bradycardic with a pulse in the high 50s, subsequently improved to the 70s, saturating well on room air.  She was alert and oriented x3, GCS 15, with no neurologic deficits noted.  No seizure-like activity.  I called poison control and listed the medications that were ingested in the timeframe of ingestion.  At this time, the patient is cleared as she has passed the observation window from her initial time of ingestion and given the amount ingested.  She has never been hypotensive and she is not had any neurologic abnormalities.  An EKG was performed which  revealed sinus bradycardia, heart rate 5 8, short PR 106, QRS 109, QTc 447, no ST-T changes.  I recommended that the patient resume her home medications in the morning and follow-up outpatient as needed.  Overall stable for discharge home.  Final Clinical Impression(s) / ED Diagnoses Final diagnoses:  Accidental medication error, initial encounter    Rx / DC Orders ED Discharge Orders     None        Regan Lemming, MD 10/04/20 [phone]

## 2020-10-04 NOTE — ED Notes (Signed)
[facility] called for transport.  

## 2020-10-04 NOTE — ED Notes (Signed)
ED Provider at bedside. 

## 2020-10-04 NOTE — Discharge Instructions (Signed)
You are past the window where the adverse effects of the medication you were administered will cause symptoms.  At this time you are stable for discharge.

## 2020-10-04 NOTE — ED Triage Notes (Signed)
Pt resides at [facility]. The nurse confused her with another pt and gave incorrect morning meds given at approx 0830-0930. She received  Vitamin D3 4mcg Gabapentin 300 mg Hipres 1 gram Lasix 20 mg Bupropion 150mg  Calcium 600 with Vit D 3 55mcg Coenzyme Q10 100 mg Colace 100 mg Dilitazem 240 mg   Followed by her meds ASA  B12 Protonix Voltaren  Held her Lasix and Metoprolol.

## 2020-10-06 ENCOUNTER — Other Ambulatory Visit: Payer: Self-pay

## 2020-10-06 ENCOUNTER — Ambulatory Visit (INDEPENDENT_AMBULATORY_CARE_PROVIDER_SITE_OTHER): Payer: PPO | Admitting: Cardiology

## 2020-10-06 ENCOUNTER — Encounter: Payer: Self-pay | Admitting: Cardiology

## 2020-10-06 VITALS — BP 100/60 | HR 48 | Ht 66.0 in | Wt 156.0 lb

## 2020-10-06 DIAGNOSIS — I10 Essential (primary) hypertension: Secondary | ICD-10-CM | POA: Diagnosis not present

## 2020-10-06 DIAGNOSIS — I129 Hypertensive chronic kidney disease with stage 1 through stage 4 chronic kidney disease, or unspecified chronic kidney disease: Secondary | ICD-10-CM | POA: Diagnosis not present

## 2020-10-06 DIAGNOSIS — R531 Weakness: Secondary | ICD-10-CM | POA: Diagnosis not present

## 2020-10-06 DIAGNOSIS — I5189 Other ill-defined heart diseases: Secondary | ICD-10-CM

## 2020-10-06 DIAGNOSIS — R600 Localized edema: Secondary | ICD-10-CM

## 2020-10-06 DIAGNOSIS — R778 Other specified abnormalities of plasma proteins: Secondary | ICD-10-CM

## 2020-10-06 DIAGNOSIS — E782 Mixed hyperlipidemia: Secondary | ICD-10-CM | POA: Diagnosis not present

## 2020-10-06 DIAGNOSIS — R609 Edema, unspecified: Secondary | ICD-10-CM

## 2020-10-06 NOTE — Patient Instructions (Signed)
Medication Instructions:   No  changes  *If you need a refill on your cardiac medications before your next appointment, please call your pharmacy*   Lab Work: Not needed If you have labs (blood work) drawn today and your tests are completely normal, you will receive your results only by: [place] (if you have MyChart) OR A paper copy in the mail If you have any lab test that is abnormal or we need to change your treatment, we will call you to review the results.   Testing/Procedures: Not needed   Follow-Up: At [facility], you and your health needs are our priority.  As part of our continuing mission to provide you with exceptional heart care, we have created designated Provider Care Teams.  These Care Teams include your primary Cardiologist (physician) and Advanced Practice Providers (APPs -  Physician Assistants and Nurse Practitioners) who all work together to provide you with the care you need, when you need it.  We recommend signing up for the patient portal called "MyChart".  Sign up information is provided on this After Visit Summary.  MyChart is used to connect with patients for Virtual Visits (Telemedicine).  Patients are able to view lab/test results, encounter notes, upcoming appointments, etc.  Non-urgent messages can be sent to your provider as well.   To learn more about what you can do with MyChart, go to [email or web address].    Your next appointment:   6 month(s)  The format for your next appointment:   In Person  Provider:   Glenetta Hew, MD   Other Instructions  See accompanying report consultation  sheet.

## 2020-10-06 NOTE — Progress Notes (Signed)
Primary Care Provider: Verl Blalock, NP; Cardiologist: None Electrophysiologist: None  Clinic Note: Chief Complaint  Patient presents with   New Patient (Initial Visit)    Grade 2 diastolic dysfunction; mild troponin elevation in setting of UTI   Leg Swelling    Perhaps predating her COVID-19 infection    ===================================  ASSESSMENT/PLAN   Problem List Items Addressed This Visit       Cardiology Problems   Mixed hyperlipidemia    LDL as of January was 135.  Would not recommend treating given her advanced age.  The time to treat it was when she had her stroke/TIA in 2005.      Relevant Medications   furosemide (LASIX) 40 MG tablet   Hypertension (Chronic)    She carries a diagnosis of hypertension, but is borderline hypotensive today on low-dose Lopressor plus furosemide.  Low threshold to consider reducing beta-blocker dose to 12.5 mg daily.      Relevant Medications   furosemide (LASIX) 40 MG tablet   Other Relevant Orders   EKG 12-Lead     Other   Elevated troponin level not due myocardial infarction    Elevated troponin in the setting of UTI and COVID-19 infection.  No symptoms.  No wall motion on echo.  Did not breach the 100 number criteria for clinical significance.  Suspected to be demand ischemia.  With normal wall motion and no symptoms at the time of chest pain pressure or dyspnea, I would not recommend any further evaluation.  She is 87 years old and quite likely has at least some coronary artery disease which does not warrant being evaluated at this time.  She is on a beta-blocker and aspirin.  No further recommendations.      Diastolic dysfunction without heart failure (Chronic)    Grade 2 diastolic function on echocardiogram is almost normal for age given the advanced age of 31.  She does not have any signs or symptoms of heart failure such as PND or orthopnea.  She was not dyspneic having COVID-19.  -> I spent a long time  discussing the pathophysiology of diastolic dysfunction, and association with advanced age.  We talked about treatment/management with minimal further evaluation.  Family agreed.  I do not think that her edema is related to CHF although some mild component of pulmonary hypertension from kyphoscoliosis may be contributing.  The way to treat diastolic dysfunction is blood pressure and heart rate control and diuresis if necessary.  She is on a diuretic if stable dose and actually is borderline hypotensive and bradycardic on a beta-blocker.  If anything, would potentially consider backing down her beta-blocker dose.    She tolerated COVID-19 infection and a UTI combined without any major symptoms or complications, I would not recommend any further evaluation.       Generalized weakness    She needs to continue therapy and rehab in the skilled nursing facility otherwise she will continue to decline.  I provided some recommendations for leg elevation and exercises.      Lower extremity edema - Primary (Chronic)    Bilateral lower extremity edema and the patient who is extremely deconditioned and has advanced age.  Without having PND or orthopnea, it is unlikely that diastolic dysfunction is the cause of edema.  She did have evidence of mildly elevated PA pressures on echo which is probably related to combination of COVID infection as well as significant kyphosis leading to almost restrictive lung disease and therefore decreased  lung capacity related to pulm hypertension.  However I think the main reason for her edema is most likely venous stasis from deconditioning and muscle atrophy.   Recommendations: She is on furosemide which is reasonable for edema. Continue metoprolol, however with relatively low resting heart rate, may need to reduce dose to 12.5 mg. Recommend at least mild if not mild to moderate weight support stockings -> daughter is aware of the medical supply store to purchase  them. Also recommended foot elevation while sleeping and napping.  Daughter will research the Lounge Doctor pillow.  Can be purchased online or at the same medical supply store that orthotic was purchased. Also continue to recommend routine leg exercises several times a day doing dorsiflexion, plantarflexion and pedaling motion.  They will look into buying a seated stationary pedal bike.        Relevant Orders   EKG 12-Lead    ===================================  HPI:    [name] is a 87 y.o. female with a PMH notable for HTN/HLD , chronic anemia, h/o Breast CA (in remission), TIA (Carotid Artery Occlusion), recent COVID-19 infection (June 26-July 20, 2020 along with UTI), and Chronic Lower Extremity Edema who is being seen today for the evaluation of GRADE 2 [place] at the request of Kelton Pillar, MD / Verl Blalock, NP  Recent Hospitalizations:  62/6-07/20/2020: Admitted to [facility]; COVID infection along with UTI..  Treated with antibiotics for UTI and remdesivir x3. Apparently, was relatively asymptomatic of the COVID infection. ->  No evidence of pneumonia or hypoxia.  No respiratory issues. Had elevated troponin (30-87) without cardiac symptoms.  => No further cardiac work-up was recommended -> suggested outpatient follow-up. She Was Discharged to [place]: [facility].  Was very disabled. ->  Had about 1 month of physical therapy, but no longer having rehab therapy.  Minimal therapy provided now.  [name] was last seen on 07/24/2020 by Verl Blalock, NP for hospital follow-up.  She remains very debilitated, at the time of the visit was still getting physical therapy.  But other COVID is a remained stable.  No major complaints.  Started noting edema.  Referred to cardiology because of elevated troponins in the hospital and grade 2 diastolic dysfunction, without active symptoms.   Reviewed  CV studies:     The following studies were reviewed today: (if available, images/films reviewed: From Epic Chart or Care Everywhere) Echocardiogram 07/10/2020: Normal EF 60 to 65%.  No R WMA mild LVH.  GRII DD.  Moderately elevated PAP.  Moderate LA dilation.  Mild to moderate MR.  Moderate TR.  (very) mild aortic stenosis.  (Mean gradient 8 mmHg).   Interval History:   [name] today with her daughter and son-in-law for cardiology assessment.  She is profoundly debilitated, remains in a wheelchair.  Has Strader to stand up, pivot and transfer out of the wheelchair, but is not yet really walking.  Unfortunately, after the first month of rehab, she has not been getting routine exercise and rehab-minimal activity at the skilled nursing facility now.  As such, she is becoming more deconditioned and more disabled.  Mostly wheelchair-bound.  She has started developing pretty significant edema that may have preceded her hospitalization, but difficult to tell.  She has pitting edema to the mid calf but really denies PND orthopnea.  She sleeps upright a little bit more for comfort than anything else.  Remarkably, she is pretty asymptomatic when they  came to her COVID-19 infection.  There is probably more bothered by the UTI.  Unfortunately this set her back quite a bit as far as debilitation.  Apparently prior to her hospitalization, she was at least able to walk around the house.  She denies any chest pain or pressure with rest or exertion.  Does not really do much exertion to notice if she has any exertional dyspnea.  Maybe a few skipped beats here and there, but no rapid irregular heartbeats or palpitations.  She does get dizzy when standing, but denies any syncope/near syncope or TIA/amaurosis fugax.  She is not walking enough to note any claudication.    She does say that her legs feel heavy and ache, the more she is sitting with her legs hanging down they feel more uncomfortable.  Edema does go down some at  night, not fully.   REVIEWED OF SYSTEMS   Review of Systems  Constitutional:  Positive for malaise/fatigue (Very deconditioned; essentially wheelchair-bound.). Negative for weight loss (Weights dating back to 09/05/2020 range from 150.5 up to 154.8.  Most recent weight at his SNF was 153.1 pounds.).  HENT:  Negative for congestion and nosebleeds.   Respiratory:  Positive for cough and shortness of breath (Not really at rest, but may be if she does activity.  Not very active.).   Cardiovascular:  Positive for leg swelling.  Gastrointestinal:  Negative for blood in stool and melena.  Genitourinary:  Negative for dysuria, frequency and hematuria.  Musculoskeletal:  Positive for back pain. Negative for falls.  Neurological:  Positive for dizziness (If she stands up quickly) and weakness (Generalized weakness, mostly legs.  Not yet strong enough to walk more than to transfer.).  Psychiatric/Behavioral:  Positive for memory loss. Negative for depression (Certainly not happy about being at skilled nursing facility and being so debilitated.). The patient is nervous/anxious.    I have reviewed and (if needed) personally updated the patient's problem list, medications, allergies, past medical and surgical history, social and family history.   PAST MEDICAL HISTORY   Past Medical History:  Diagnosis Date   Arthritis 04/13/2009   Right knee   Carotid artery occlusion 07/15/2003   Identified at the time of stroke   COVID-19 virus infection 07/09/2020   Admitted with weakness, found to have UTI and COVID-19.  Treated with 3 days of remdesivir. ->  Remains extremely debilitated   Depression    Depression    GERD (gastroesophageal reflux disease)    Hyperlipidemia    Hypertension    Stroke ([place]) 07/15/2003   Mini   Thoracic kyphosis     PAST SURGICAL HISTORY   Past Surgical History:  Procedure Laterality Date   CATARACT EXTRACTION W/ INTRAOCULAR LENS  IMPLANT, BILATERAL     TRANSTHORACIC  ECHOCARDIOGRAM  07/10/2020   In setting of UTI and COVID-19 infection, mildly elevated <100 hs Troponin (Demand ischemia) - Normal EF 60 to 65%.  No R WMA mild LVH.  GRII DD.  Moderately elevated PAP.  Moderate LA dilation.  Mild to moderate MR.  Moderate TR.  (very) mild aortic stenosis.  (Mean gradient 8 mmHg).    Immunization History  Administered Date(s) Administered   Influenza Split 11/05/2007, 11/03/2012, 10/17/2013   Influenza,inj,Quad PF,6+ Mos 02/11/2020   Influenza-Unspecified 10/04/2014, 10/05/2015, 10/10/2016, 10/31/2017, 10/16/2018   PFIZER(Purple Top)SARS-COV-2 Vaccination 02/22/2019, 03/15/2019, 04/09/2019, 10/29/2019   Pneumococcal Conjugate-13 02/16/2013   Pneumococcal Polysaccharide-23 01/15/2003   Td 01/14/2001   Tdap 08/07/2010    MEDICATIONS/ALLERGIES  Current Meds  Medication Sig   acetaminophen (TYLENOL) 325 MG tablet Take 650 mg by mouth every 6 (six) hours as needed.   anastrozole (ARIMIDEX) 1 MG tablet TAKE 1 TABLET(1 MG) BY MOUTH DAILY   aspirin 81 MG tablet Take 81 mg by mouth daily.   diclofenac Sodium (VOLTAREN) 1 % GEL Apply topically.   furosemide (LASIX) 40 MG tablet Take 40 mg by mouth daily.   metoprolol tartrate (LOPRESSOR) 25 MG tablet Take 25 mg by mouth daily.    pantoprazole (PROTONIX) 40 MG tablet Take 1 tablet (40 mg total) by mouth daily at 6 (six) AM.   PARoxetine (PAXIL) 30 MG tablet Take 30 mg by mouth daily.    Probiotic Product (PROBIOTIC-10 PO) Take 1 tablet by mouth daily.    Allergies  Allergen Reactions   Fluogen [Influenza Virus Vaccine]     Other reaction(s): didn't tolerate the high dose shot   Penicillins    Sulfa Antibiotics    Codeine Other (See Comments)   Penicillin G Rash   Prednisone Palpitations   Sulfamethoxazole-Trimethoprim Rash    SOCIAL HISTORY/FAMILY HISTORY   Reviewed in Epic:  Pertinent findings:  Social History   Tobacco Use   Smoking status: Never   Smokeless tobacco: Former    Types: Snuff   Vaping Use   Vaping Use: Never used  Substance Use Topics   Alcohol use: No    Alcohol/week: 0.0 standard drinks   Drug use: No   Social History   Social History Narrative   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 mostly in wheelchair now.      NOK/Agent: Daughter: Rob Hickman, (son-in-law Claire Shown); [address], [place] [address]; [phone]; [phone])      Additional Emergency Contact:   *Tommy and Alicha Raspberry; [address] [place], [place] [address]   [phone] [address]), [phone] Vicente Males)    OBJCTIVE -PE, EKG, labs   Wt Readings from Last 3 Encounters:  10/06/20 156 lb (70.8 kg)  10/04/20 156 lb (70.8 kg)  07/17/20 150 lb 2.1 oz (68.1 kg)    Physical Exam: BP 100/60 (BP Location: Left Arm)   Pulse (!) 48   Ht 5\' 6"  (1.676 m)   Wt 156 lb (70.8 kg)   SpO2 97%   BMI 25.18 kg/m  Physical Exam Vitals reviewed.  Constitutional:      General: She is not in acute distress.    Appearance: She is not toxic-appearing.     Comments: Pleasant, frail elderly woman, seated in wheelchair.  Has significant thoracic kyphosis, leaning forward in the chair.  Appears quite debilitated.  Hair is somewhat mussed, but seems well-nourished, well-groomed.  HENT:     Head: Normocephalic and atraumatic.  Neck:     Vascular: No carotid bruit, hepatojugular reflux or JVD.  Cardiovascular:     Rate and Rhythm: Normal rate and regular rhythm. No extrasystoles are present.    Chest Wall: PMI is not displaced.     Pulses: Decreased pulses (Decreased because of pedal edema.).     Heart sounds: S1 normal and S2 normal. Murmur heard.  Harsh crescendo-decrescendo early systolic murmur is present with a grade of 2/6 at the upper right sternal border radiating to the neck.    No friction rub. No gallop. No S4 sounds.  Pulmonary:     Effort: Pulmonary effort is normal. No respiratory  distress.     Comments:  Diminished basal breath sounds with mild crackles but no rales or rhonchi.  No wheezing. Chest:     Chest wall: Tenderness present.  Musculoskeletal:        General: No swelling (Bilateral pitting edema to the knees roughly 2+ in the ankles and 1+ up to the knees.).     Cervical back: Normal range of motion and neck supple.     Right lower leg: Edema present.     Left lower leg: Edema present.  Skin:    General: Skin is warm and dry.     Coloration: Skin is pale.  Neurological:     General: No focal deficit present.     Mental Status: She is alert and oriented to person, place, and time.     Comments: Wheelchair-bound  Psychiatric:     Comments: Seems depressed.  Stoic.  Slow to answer questions.     Adult ECG Report  Rate: 50;  Rhythm: sinus bradycardia and Voltage criteria for LVH with mild QRS widening.  Otherwise normal axis, intervals and durations. ;   Narrative Interpretation: Relatively normal besides bradycardia  Recent Labs:   02/11/2020: TC 203, TG :183, HDL 36, LDL 135; A1c 5.6  Lab Results  Component Value Date   CREATININE 0.96 07/18/2020   BUN 31 (H) 07/18/2020   NA 135 07/18/2020   K 4.7 07/18/2020   CL 104 07/18/2020   CO2 27 07/18/2020   CBC Latest Ref Rng & Units 07/18/2020 07/15/2020 07/14/2020  WBC 4.0 - 10.5 K/uL 7.3 7.5 6.5  Hemoglobin 12.0 - 15.0 g/dL 8.3(L) 8.7(L) 8.3(L)  Hematocrit 36.0 - 46.0 % 25.3(L) 25.1(L) 24.7(L)  Platelets 150 - 400 K/uL 267 255 226   09/13/2020: CBC: W 6.9, H/H 10.4/30.8, Plt 200,MCH 33.5 ; (8/15: H/H 10.5/31.8- MCH 32.9)  No results found for: HGBA1C Lab Results  Component Value Date   TSH 1.882 07/09/2020    ==================================================  COVID-19 Education: The signs and symptoms of COVID-19 were discussed with the patient and how to seek care for testing (follow up with PCP or arrange E-visit).    I spent a total of 56 minutes with the patient spent in direct patient  consultation.  --> Patient is hard of hearing, and family had multiple questions.  Spent a long time explaining pathophysiology of diastolic dysfunction versus diastolic heart failure, and etiologies for edema.  We then spent quite a bit of time discussing recommendations going forward.  Additional time spent with chart review  / charting (studies, outside notes, etc): 45 min -> Clinic notes, hospital H&P and discharge summary as well as echocardiogram (personally reviewed), and lab results plus vital signs from skilled nursing facility all reviewed.  Full past medical history, surgical and social history updated. Total Time: 101 min  Current medicines are reviewed at length with the patient today.  (+/- concerns) n/a  This visit occurred during the SARS-CoV-2 public health emergency.  Safety protocols were in place, including screening questions prior to the visit, additional usage of staff PPE, and extensive cleaning of exam room while observing appropriate contact time as indicated for disinfecting solutions.  Notice: This dictation was prepared with Dragon dictation along with smaller phrase technology. Any transcriptional errors that result from this process are unintentional and may not be corrected upon review.  Patient Instructions / Medication Changes & Studies & Tests Ordered   Patient Instructions  Medication Instructions:   No  changes  *If you need a refill on your cardiac medications before  your next appointment, please call your pharmacy*   Lab Work: Not needed If you have labs (blood work) drawn today and your tests are completely normal, you will receive your results only by: [place] (if you have MyChart) OR A paper copy in the mail If you have any lab test that is abnormal or we need to change your treatment, we will call you to review the results.   Testing/Procedures: Not needed   Follow-Up: At [facility], you and your health needs are our priority.   As part of our continuing mission to provide you with exceptional heart care, we have created designated Provider Care Teams.  These Care Teams include your primary Cardiologist (physician) and Advanced Practice Providers (APPs -  Physician Assistants and Nurse Practitioners) who all work together to provide you with the care you need, when you need it.  We recommend signing up for the patient portal called "MyChart".  Sign up information is provided on this After Visit Summary.  MyChart is used to connect with patients for Virtual Visits (Telemedicine).  Patients are able to view lab/test results, encounter notes, upcoming appointments, etc.  Non-urgent messages can be sent to your provider as well.   To learn more about what you can do with MyChart, go to [email or web address].    Your next appointment:   6 month(s)  The format for your next appointment:   In Person  Provider:   Glenetta Hew, MD   Other Instructions  See accompanying report consultation  sheet.   Studies Ordered:   Orders Placed This Encounter  Procedures   EKG 12-Lead     Glenetta Hew, M.D., M.S. Interventional Cardiologist   Pager # [phone] Phone # [phone] [address] [place], [place] [address]   Thank you for choosing Heartcare at [facility]!!

## 2020-10-07 ENCOUNTER — Encounter: Payer: Self-pay | Admitting: Cardiology

## 2020-10-07 DIAGNOSIS — M1990 Unspecified osteoarthritis, unspecified site: Secondary | ICD-10-CM | POA: Diagnosis not present

## 2020-10-07 DIAGNOSIS — R2681 Unsteadiness on feet: Secondary | ICD-10-CM | POA: Diagnosis not present

## 2020-10-07 DIAGNOSIS — M6281 Muscle weakness (generalized): Secondary | ICD-10-CM | POA: Diagnosis not present

## 2020-10-07 DIAGNOSIS — I5189 Other ill-defined heart diseases: Secondary | ICD-10-CM | POA: Insufficient documentation

## 2020-10-07 NOTE — Assessment & Plan Note (Signed)
Elevated troponin in the setting of UTI and COVID-19 infection.  No symptoms.  No wall motion on echo.  Did not breach the 100 number criteria for clinical significance.  Suspected to be demand ischemia.  With normal wall motion and no symptoms at the time of chest pain pressure or dyspnea, I would not recommend any further evaluation.  She is 87 years old and quite likely has at least some coronary artery disease which does not warrant being evaluated at this time.  She is on a beta-blocker and aspirin.  No further recommendations.

## 2020-10-07 NOTE — Assessment & Plan Note (Signed)
Bilateral lower extremity edema and the patient who is extremely deconditioned and has advanced age.  Without having PND or orthopnea, it is unlikely that diastolic dysfunction is the cause of edema.  She did have evidence of mildly elevated PA pressures on echo which is probably related to combination of COVID infection as well as significant kyphosis leading to almost restrictive lung disease and therefore decreased lung capacity related to pulm hypertension.  However I think the main reason for her edema is most likely venous stasis from deconditioning and muscle atrophy.   Recommendations:  She is on furosemide which is reasonable for edema.  Continue metoprolol, however with relatively low resting heart rate, may need to reduce dose to 12.5 mg.  Recommend at least mild if not mild to moderate weight support stockings -> daughter is aware of the medical supply store to purchase them.  Also recommended foot elevation while sleeping and napping.  Daughter will research the [employer] pillow.  Can be purchased online or at the same medical supply store that orthotic was purchased.  Also continue to recommend routine leg exercises several times a day doing dorsiflexion, plantarflexion and pedaling motion.  They will look into buying a seated stationary pedal bike.

## 2020-10-07 NOTE — Assessment & Plan Note (Signed)
She needs to continue therapy and rehab in the skilled nursing facility otherwise she will continue to decline.  I provided some recommendations for leg elevation and exercises.

## 2020-10-07 NOTE — Assessment & Plan Note (Signed)
She carries a diagnosis of hypertension, but is borderline hypotensive today on low-dose Lopressor plus furosemide.  Low threshold to consider reducing beta-blocker dose to 12.5 mg daily.

## 2020-10-07 NOTE — Assessment & Plan Note (Addendum)
Grade 2 diastolic function on echocardiogram is almost normal for age given the advanced age of 70.  She does not have any signs or symptoms of heart failure such as PND or orthopnea.  She was not dyspneic having COVID-19.  -> I spent a long time discussing the pathophysiology of diastolic dysfunction, and association with advanced age.  We talked about treatment/management with minimal further evaluation.  Family agreed.  I do not think that her edema is related to CHF although some mild component of pulmonary hypertension from kyphoscoliosis may be contributing.  The way to treat diastolic dysfunction is blood pressure and heart rate control and diuresis if necessary.  She is on a diuretic if stable dose and actually is borderline hypotensive and bradycardic on a beta-blocker.  If anything, would potentially consider backing down her beta-blocker dose.    She tolerated COVID-19 infection and a UTI combined without any major symptoms or complications, I would not recommend any further evaluation.

## 2020-10-07 NOTE — Assessment & Plan Note (Signed)
LDL as of January was 135.  Would not recommend treating given her advanced age.  The time to treat it was when she had her stroke/TIA in 2005.

## 2020-10-09 DIAGNOSIS — M1990 Unspecified osteoarthritis, unspecified site: Secondary | ICD-10-CM | POA: Diagnosis not present

## 2020-10-09 DIAGNOSIS — D649 Anemia, unspecified: Secondary | ICD-10-CM | POA: Diagnosis not present

## 2020-10-09 DIAGNOSIS — I10 Essential (primary) hypertension: Secondary | ICD-10-CM | POA: Diagnosis not present

## 2020-10-09 DIAGNOSIS — K219 Gastro-esophageal reflux disease without esophagitis: Secondary | ICD-10-CM | POA: Diagnosis not present

## 2020-10-11 DIAGNOSIS — M6281 Muscle weakness (generalized): Secondary | ICD-10-CM | POA: Diagnosis not present

## 2020-10-12 DIAGNOSIS — I5189 Other ill-defined heart diseases: Secondary | ICD-10-CM | POA: Diagnosis not present

## 2020-10-12 DIAGNOSIS — K219 Gastro-esophageal reflux disease without esophagitis: Secondary | ICD-10-CM | POA: Diagnosis not present

## 2020-10-12 DIAGNOSIS — B351 Tinea unguium: Secondary | ICD-10-CM | POA: Diagnosis not present

## 2020-10-12 DIAGNOSIS — D649 Anemia, unspecified: Secondary | ICD-10-CM | POA: Diagnosis not present

## 2020-10-12 DIAGNOSIS — I10 Essential (primary) hypertension: Secondary | ICD-10-CM | POA: Diagnosis not present

## 2020-10-12 DIAGNOSIS — L84 Corns and callosities: Secondary | ICD-10-CM | POA: Diagnosis not present

## 2020-10-12 DIAGNOSIS — M2042 Other hammer toe(s) (acquired), left foot: Secondary | ICD-10-CM | POA: Diagnosis not present

## 2020-10-12 DIAGNOSIS — M1990 Unspecified osteoarthritis, unspecified site: Secondary | ICD-10-CM | POA: Diagnosis not present

## 2020-10-12 DIAGNOSIS — I7091 Generalized atherosclerosis: Secondary | ICD-10-CM | POA: Diagnosis not present

## 2020-10-12 DIAGNOSIS — M2041 Other hammer toe(s) (acquired), right foot: Secondary | ICD-10-CM | POA: Diagnosis not present

## 2020-10-12 DIAGNOSIS — R001 Bradycardia, unspecified: Secondary | ICD-10-CM | POA: Diagnosis not present

## 2020-10-17 DIAGNOSIS — R2681 Unsteadiness on feet: Secondary | ICD-10-CM | POA: Diagnosis not present

## 2020-10-17 DIAGNOSIS — M6281 Muscle weakness (generalized): Secondary | ICD-10-CM | POA: Diagnosis not present

## 2020-10-17 DIAGNOSIS — M1990 Unspecified osteoarthritis, unspecified site: Secondary | ICD-10-CM | POA: Diagnosis not present

## 2020-10-18 DIAGNOSIS — D649 Anemia, unspecified: Secondary | ICD-10-CM | POA: Diagnosis not present

## 2020-10-20 DIAGNOSIS — M1990 Unspecified osteoarthritis, unspecified site: Secondary | ICD-10-CM | POA: Diagnosis not present

## 2020-10-20 DIAGNOSIS — I10 Essential (primary) hypertension: Secondary | ICD-10-CM | POA: Diagnosis not present

## 2020-10-20 DIAGNOSIS — D649 Anemia, unspecified: Secondary | ICD-10-CM | POA: Diagnosis not present

## 2020-10-23 ENCOUNTER — Other Ambulatory Visit: Payer: PPO

## 2020-10-23 ENCOUNTER — Ambulatory Visit: Payer: PPO | Admitting: Oncology

## 2020-10-25 ENCOUNTER — Other Ambulatory Visit: Payer: PPO

## 2020-10-25 ENCOUNTER — Ambulatory Visit: Payer: PPO | Admitting: Oncology

## 2020-11-08 DIAGNOSIS — D649 Anemia, unspecified: Secondary | ICD-10-CM | POA: Diagnosis not present

## 2020-11-08 DIAGNOSIS — R5381 Other malaise: Secondary | ICD-10-CM | POA: Diagnosis not present

## 2020-11-08 DIAGNOSIS — M1990 Unspecified osteoarthritis, unspecified site: Secondary | ICD-10-CM | POA: Diagnosis not present

## 2020-11-08 DIAGNOSIS — I10 Essential (primary) hypertension: Secondary | ICD-10-CM | POA: Diagnosis not present

## 2020-11-08 DIAGNOSIS — I5189 Other ill-defined heart diseases: Secondary | ICD-10-CM | POA: Diagnosis not present

## 2020-11-08 DIAGNOSIS — K219 Gastro-esophageal reflux disease without esophagitis: Secondary | ICD-10-CM | POA: Diagnosis not present

## 2020-11-16 DIAGNOSIS — I10 Essential (primary) hypertension: Secondary | ICD-10-CM | POA: Diagnosis not present

## 2020-11-16 DIAGNOSIS — R5381 Other malaise: Secondary | ICD-10-CM | POA: Diagnosis not present

## 2020-11-16 DIAGNOSIS — M1990 Unspecified osteoarthritis, unspecified site: Secondary | ICD-10-CM | POA: Diagnosis not present

## 2020-11-16 DIAGNOSIS — D649 Anemia, unspecified: Secondary | ICD-10-CM | POA: Diagnosis not present

## 2020-11-16 DIAGNOSIS — K219 Gastro-esophageal reflux disease without esophagitis: Secondary | ICD-10-CM | POA: Diagnosis not present

## 2020-11-18 DIAGNOSIS — D649 Anemia, unspecified: Secondary | ICD-10-CM | POA: Diagnosis not present

## 2020-11-18 DIAGNOSIS — D518 Other vitamin B12 deficiency anemias: Secondary | ICD-10-CM | POA: Diagnosis not present

## 2020-11-21 DIAGNOSIS — M1990 Unspecified osteoarthritis, unspecified site: Secondary | ICD-10-CM | POA: Diagnosis not present

## 2020-11-21 DIAGNOSIS — D649 Anemia, unspecified: Secondary | ICD-10-CM | POA: Diagnosis not present

## 2020-11-21 DIAGNOSIS — I10 Essential (primary) hypertension: Secondary | ICD-10-CM | POA: Diagnosis not present

## 2020-11-28 IMAGING — MG MM DIGITAL DIAGNOSTIC UNILAT*L* W/ TOMO W/ CAD
4 series · 4 of 12 positions shown · non-contrast
Comparison: Screening mammogram September 28, 2018

CLINICAL DATA: [AGE] patient recalled from recent screening
mammogram for evaluation of a left breast mass.

EXAM:
DIGITAL DIAGNOSTIC LEFT MAMMOGRAM WITH TOMO
ULTRASOUND LEFT BREAST

[L CC synth-2D]
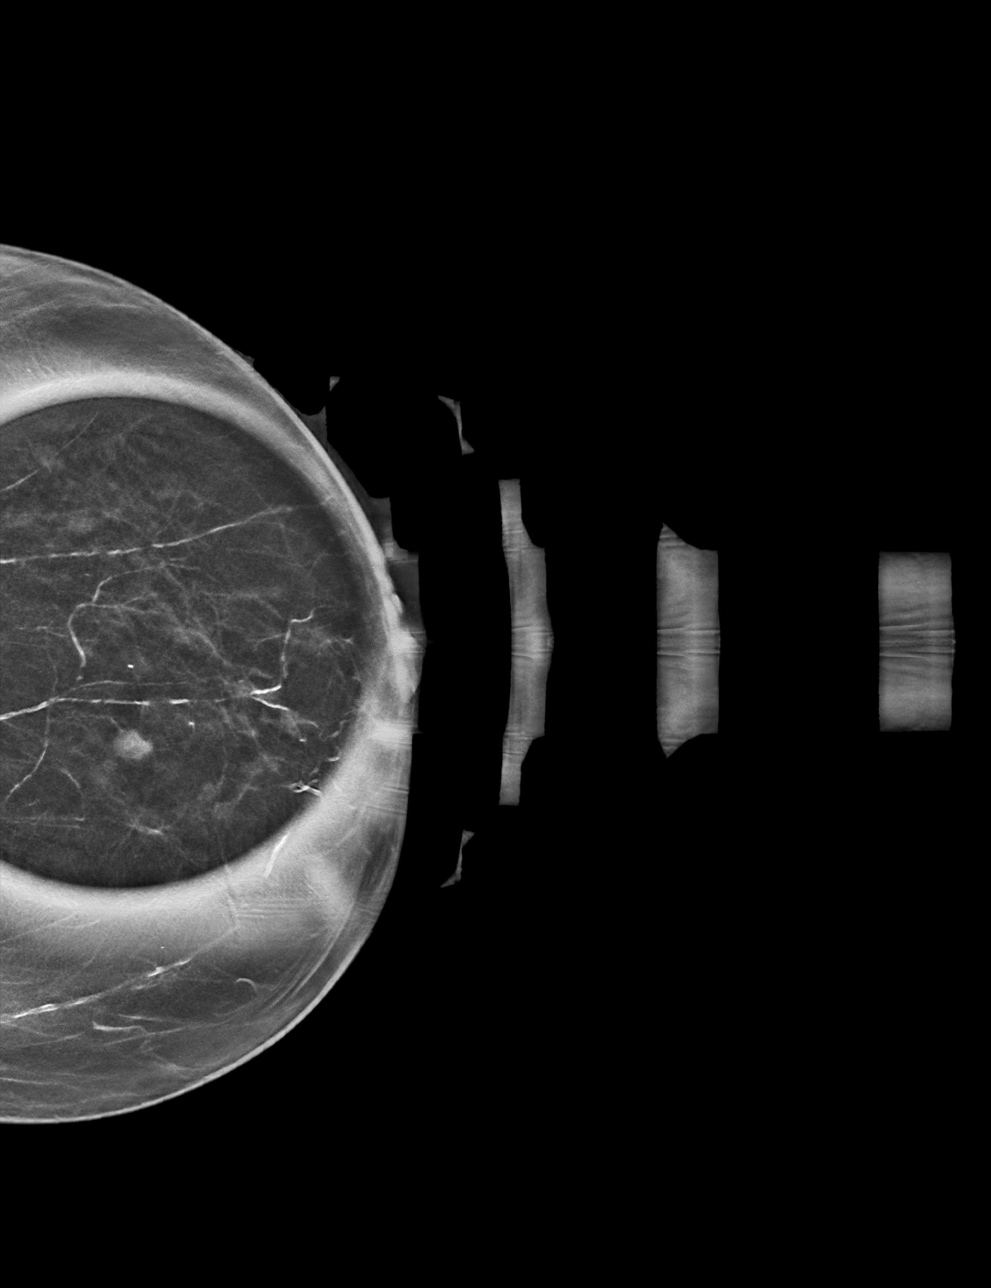

[L MLO synth-2D]
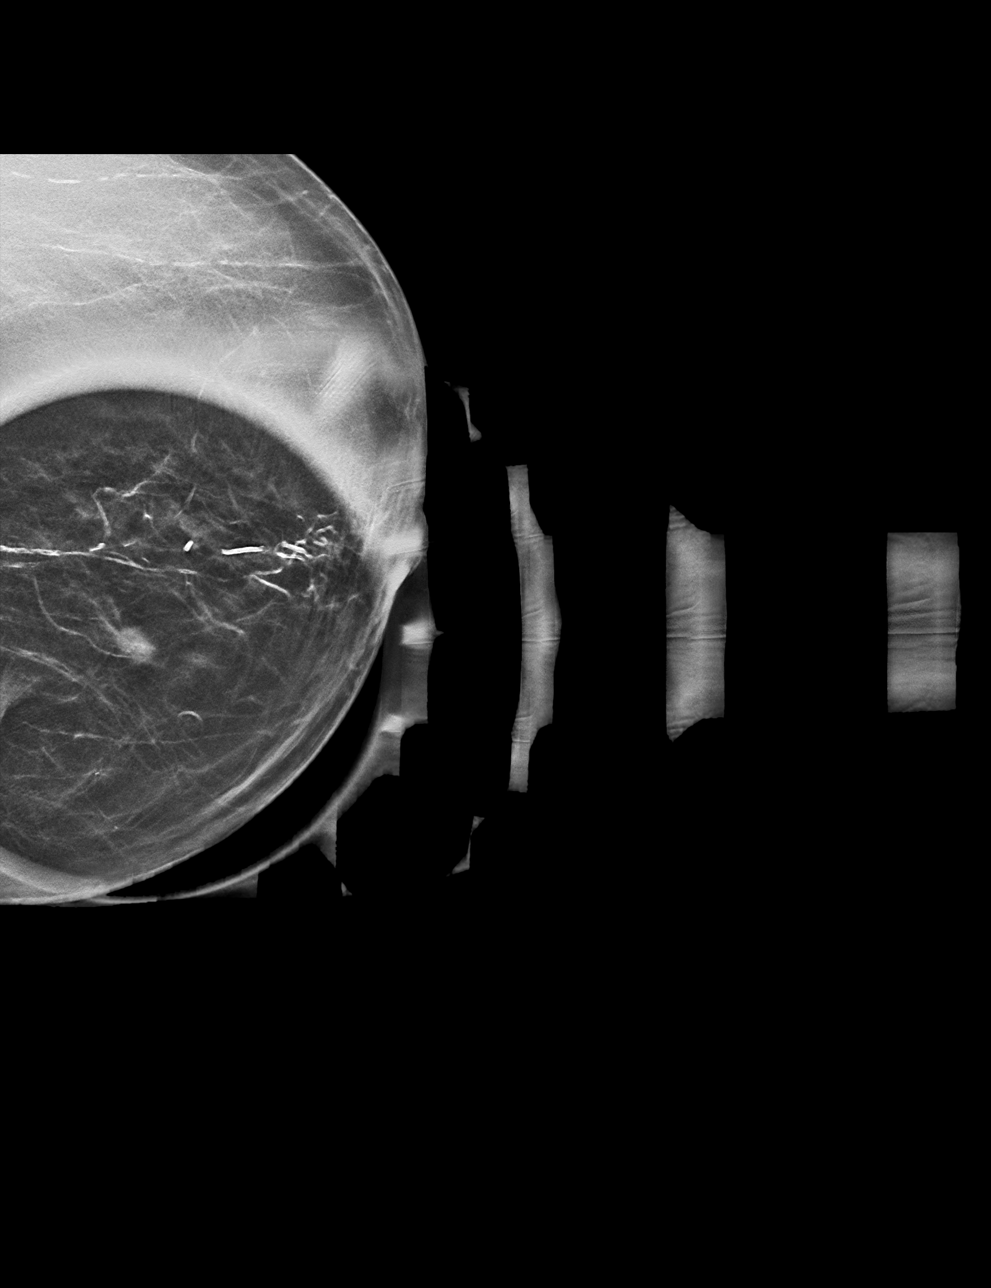

[L MLO tomo · tomo slice 22/43.0]
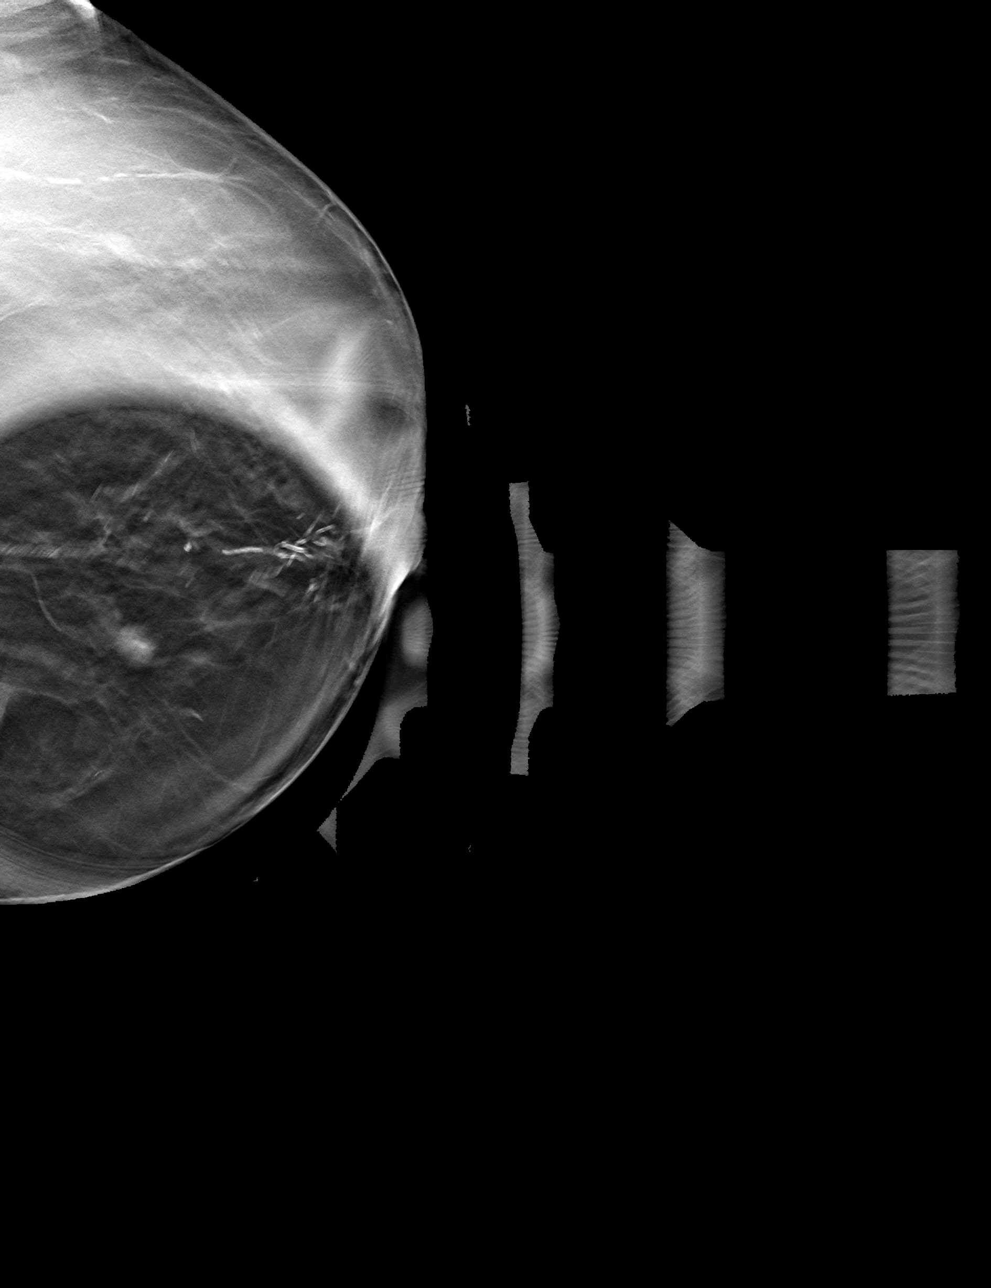

[L CC tomo · tomo slice 22/43.0]
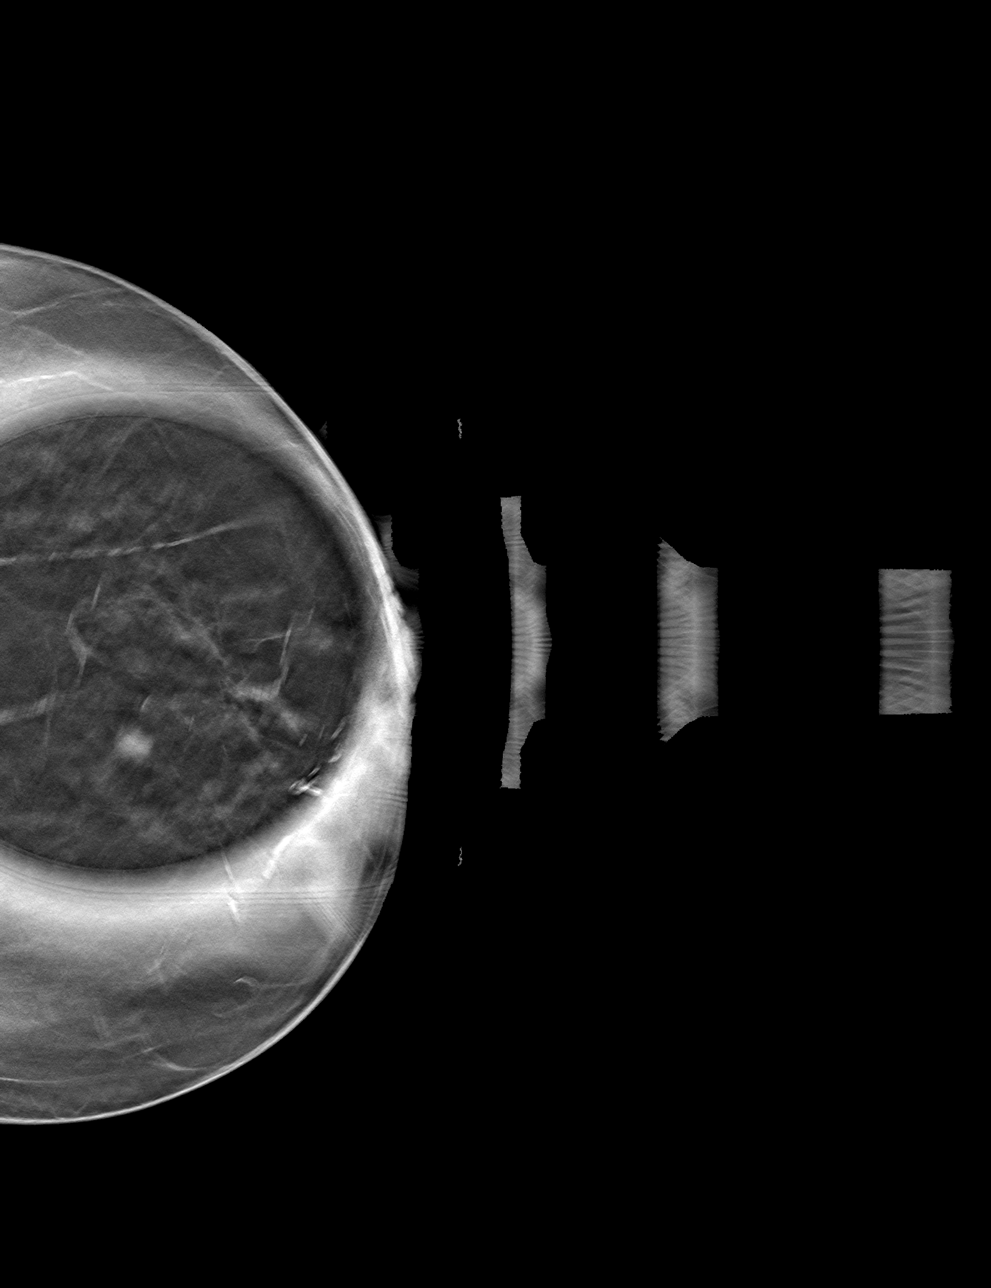

[4 of 12 positions shown; findings below may reference images not displayed]

ACR Breast Density Category b: There are scattered areas of
fibroglandular density.
FINDINGS: Spot compression views with tomography of the 6 o'clock axis of the
left breast show a dense irregular mass with indistinct margins.

On physical exam, no mass is palpated in the 6 o'clock axis of the
left breast. The skin appears normal.

Targeted ultrasound is performed, showing an irregular
heterogeneously hypoechoic mass with peripheral echogenic rim at 6
o'clock position 3 cm from the nipple. The mass is taller than wide
and measures 8 x 8 x 6 mm. Vascular flow is in the superficial
aspect of the mass.

Ultrasound of the left axilla is negative for lymphadenopathy.
IMPRESSION: Suspicious 8 mm mass in the 6 o'clock position of the left breast.

RECOMMENDATION:
Ultrasound-guided core needle biopsy of the left breast mass is
recommended. Findings were discussed with the patient and with her
daughter who accompanied her to the appointment today. The patient
is scheduling the biopsy for [REDACTED] [REDACTED].

I have discussed the findings and recommendations with the patient.
If applicable, a reminder letter will be sent to the patient
regarding the next appointment.

BI-RADS CATEGORY  5: Highly suggestive of malignancy.

## 2020-11-28 IMAGING — US US BREAST*L* LIMITED INC AXILLA
1 series · 6 of 6 positions shown · non-contrast
Comparison: Screening mammogram September 28, 2018

CLINICAL DATA: [AGE] patient recalled from recent screening
mammogram for evaluation of a left breast mass.

EXAM:
DIGITAL DIAGNOSTIC LEFT MAMMOGRAM WITH TOMO
ULTRASOUND LEFT BREAST

[Series 1: us breast*left* limited inc axilla · 0.06mm/px · 6 of 6 slices shown]
[im 1/6]
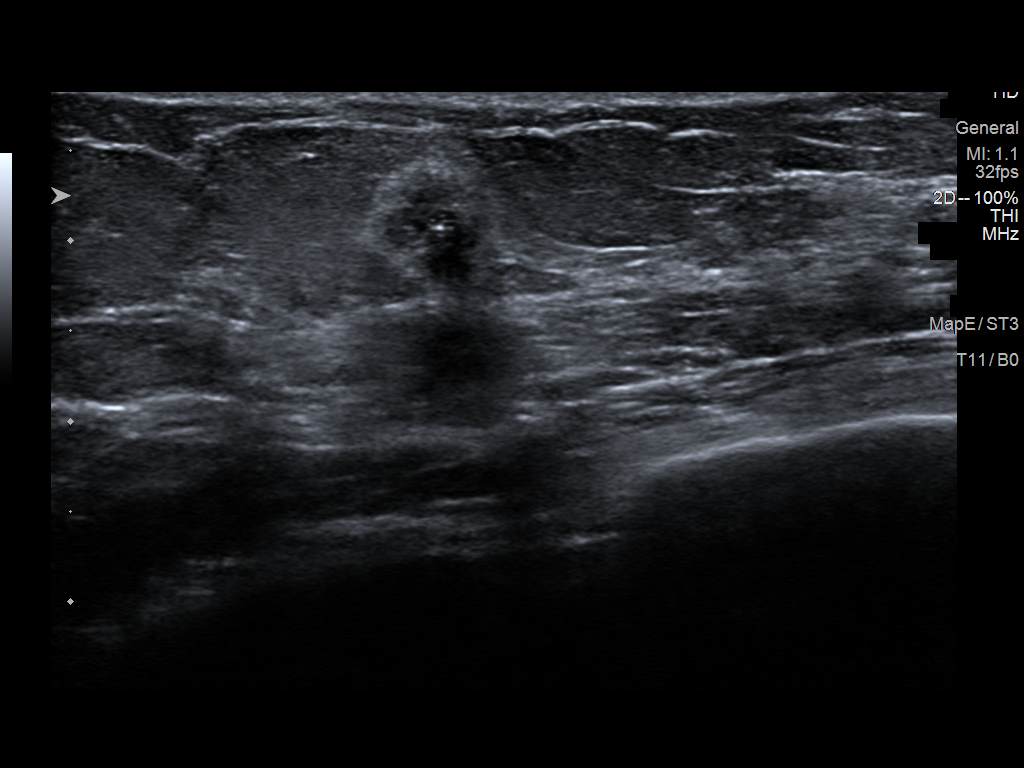
[im 2/6]
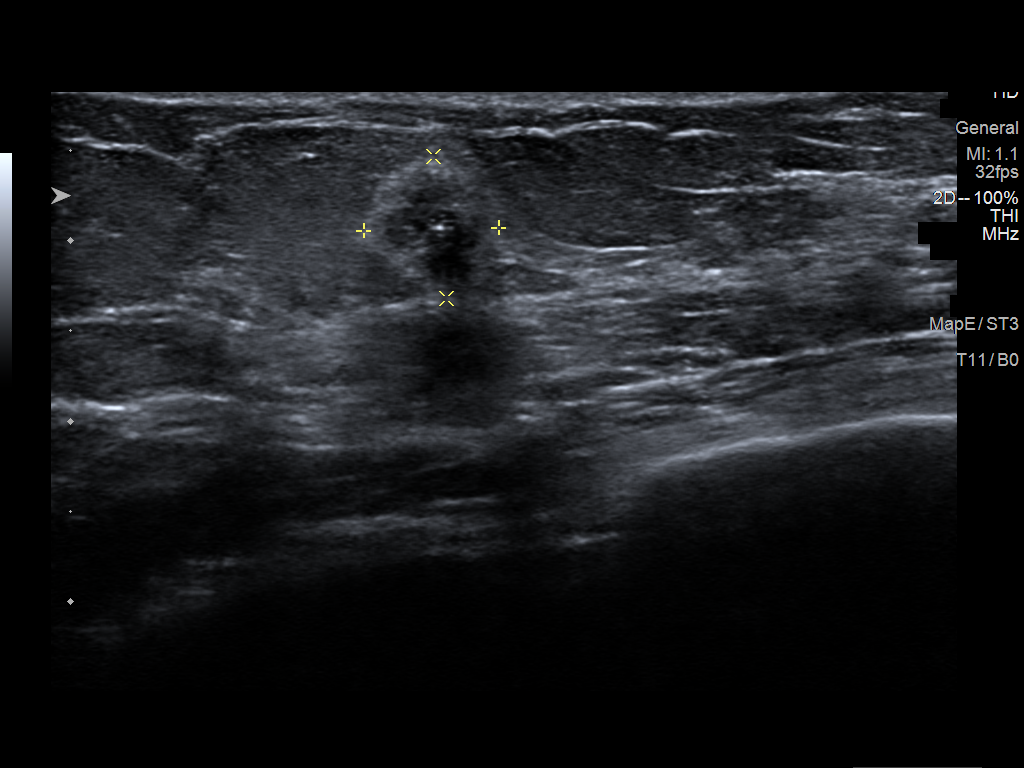
[im 3/6]
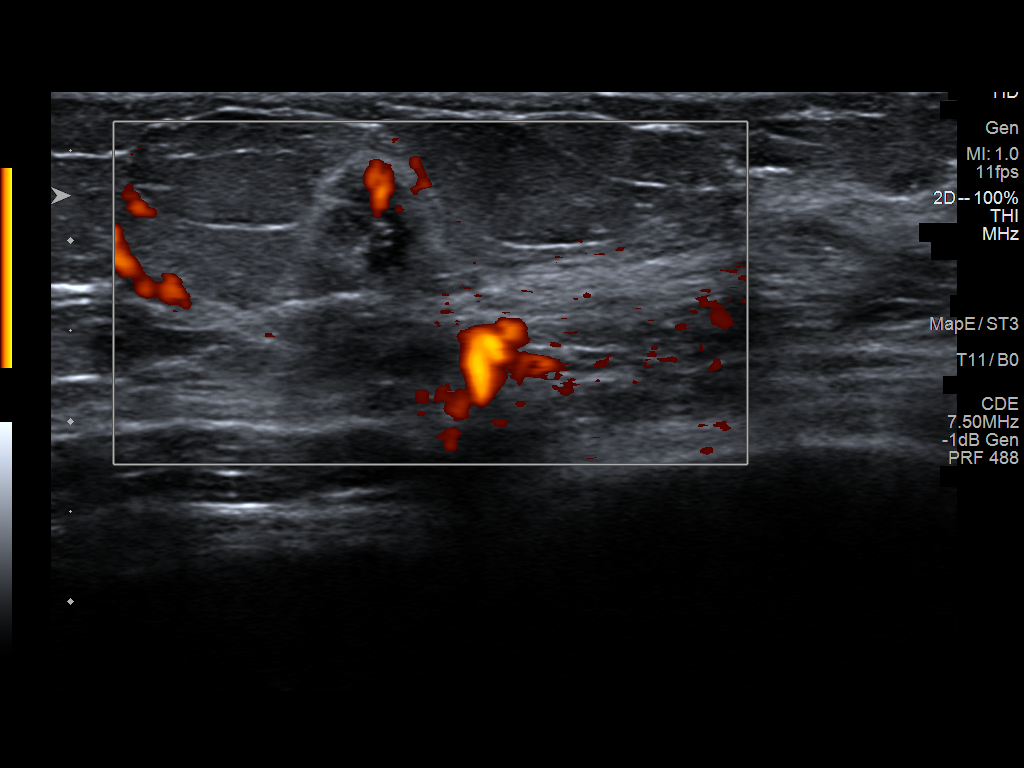
[im 4/6]
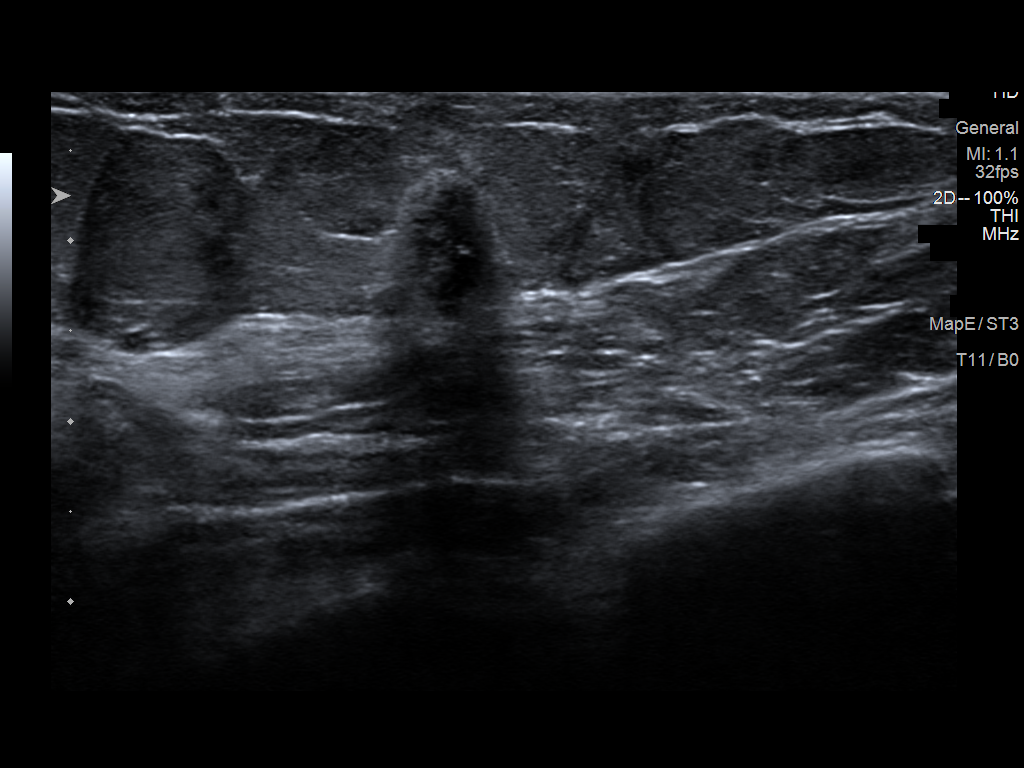
[im 5/6]
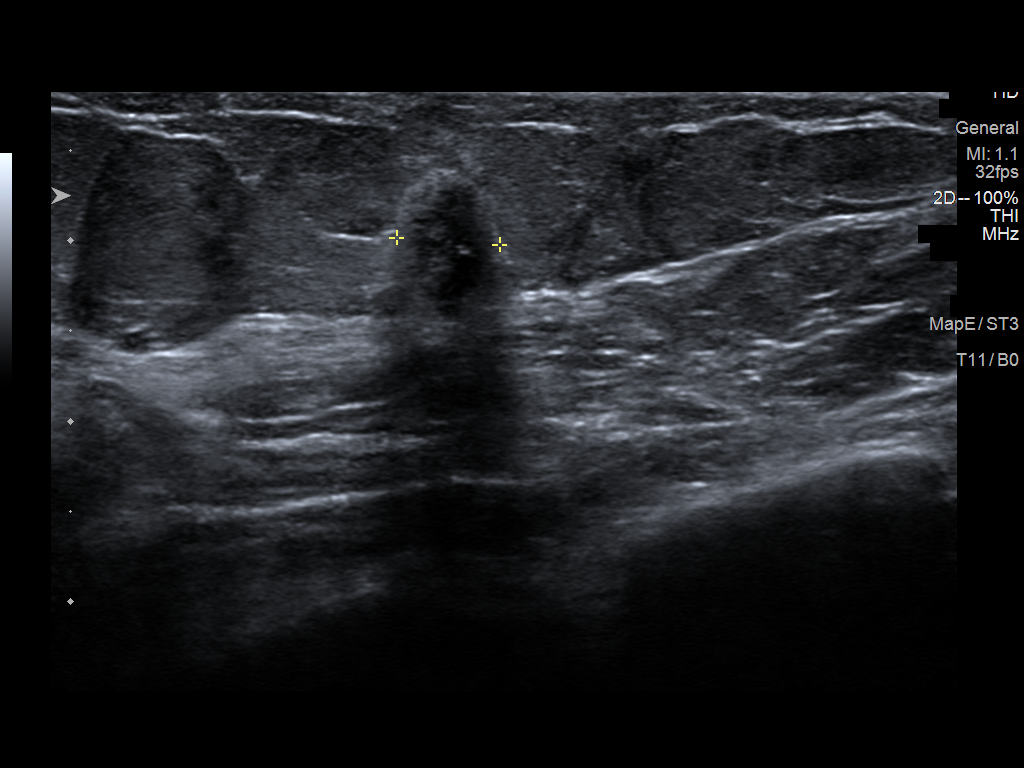
[im 6/6]
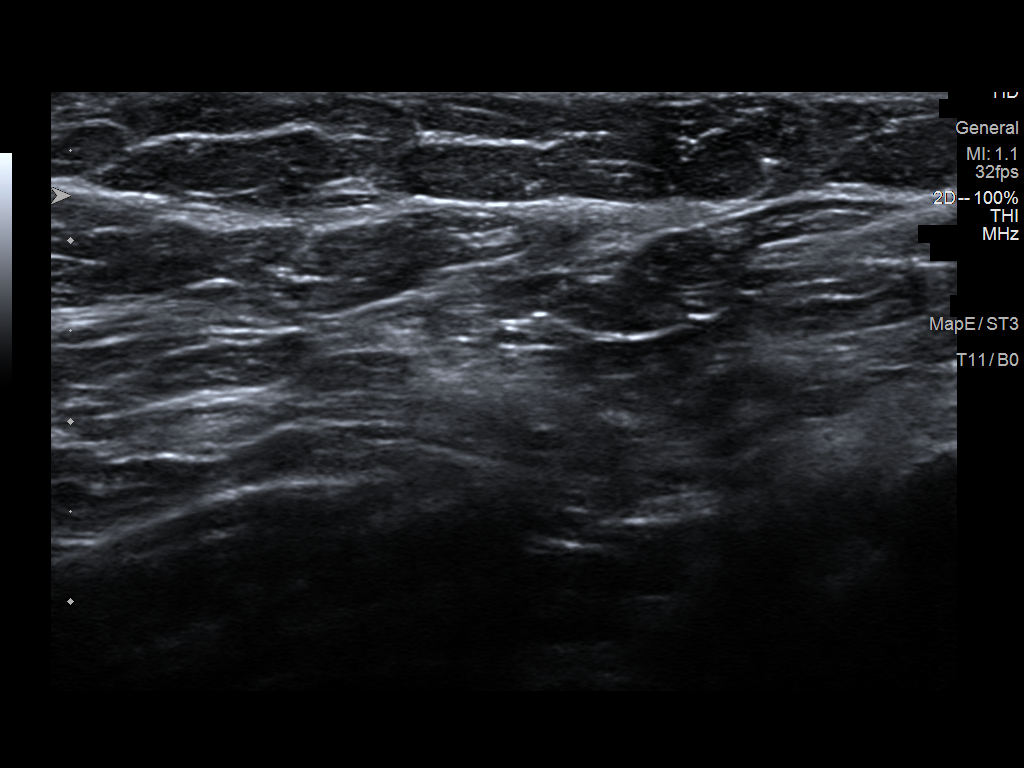

[6 of 6 positions shown; findings below may reference images not displayed]

ACR Breast Density Category b: There are scattered areas of
fibroglandular density.
FINDINGS: Spot compression views with tomography of the 6 o'clock axis of the
left breast show a dense irregular mass with indistinct margins.

On physical exam, no mass is palpated in the 6 o'clock axis of the
left breast. The skin appears normal.

Targeted ultrasound is performed, showing an irregular
heterogeneously hypoechoic mass with peripheral echogenic rim at 6
o'clock position 3 cm from the nipple. The mass is taller than wide
and measures 8 x 8 x 6 mm. Vascular flow is in the superficial
aspect of the mass.

Ultrasound of the left axilla is negative for lymphadenopathy.
IMPRESSION: Suspicious 8 mm mass in the 6 o'clock position of the left breast.

RECOMMENDATION:
Ultrasound-guided core needle biopsy of the left breast mass is
recommended. Findings were discussed with the patient and with her
daughter who accompanied her to the appointment today. The patient
is scheduling the biopsy for [REDACTED] [REDACTED].

I have discussed the findings and recommendations with the patient.
If applicable, a reminder letter will be sent to the patient
regarding the next appointment.

BI-RADS CATEGORY  5: Highly suggestive of malignancy.

## 2020-12-13 DIAGNOSIS — I10 Essential (primary) hypertension: Secondary | ICD-10-CM | POA: Diagnosis not present

## 2020-12-13 DIAGNOSIS — R5381 Other malaise: Secondary | ICD-10-CM | POA: Diagnosis not present

## 2020-12-13 DIAGNOSIS — M1990 Unspecified osteoarthritis, unspecified site: Secondary | ICD-10-CM | POA: Diagnosis not present

## 2020-12-13 DIAGNOSIS — D649 Anemia, unspecified: Secondary | ICD-10-CM | POA: Diagnosis not present

## 2020-12-13 DIAGNOSIS — K219 Gastro-esophageal reflux disease without esophagitis: Secondary | ICD-10-CM | POA: Diagnosis not present

## 2020-12-14 DIAGNOSIS — R5381 Other malaise: Secondary | ICD-10-CM | POA: Diagnosis not present

## 2020-12-14 DIAGNOSIS — K219 Gastro-esophageal reflux disease without esophagitis: Secondary | ICD-10-CM | POA: Diagnosis not present

## 2020-12-14 DIAGNOSIS — D649 Anemia, unspecified: Secondary | ICD-10-CM | POA: Diagnosis not present

## 2020-12-14 DIAGNOSIS — I10 Essential (primary) hypertension: Secondary | ICD-10-CM | POA: Diagnosis not present

## 2020-12-14 DIAGNOSIS — M1990 Unspecified osteoarthritis, unspecified site: Secondary | ICD-10-CM | POA: Diagnosis not present

## 2020-12-16 DIAGNOSIS — I10 Essential (primary) hypertension: Secondary | ICD-10-CM | POA: Diagnosis not present

## 2020-12-16 DIAGNOSIS — D649 Anemia, unspecified: Secondary | ICD-10-CM | POA: Diagnosis not present

## 2020-12-16 DIAGNOSIS — M1990 Unspecified osteoarthritis, unspecified site: Secondary | ICD-10-CM | POA: Diagnosis not present

## 2020-12-25 DIAGNOSIS — H35372 Puckering of macula, left eye: Secondary | ICD-10-CM | POA: Diagnosis not present

## 2020-12-25 DIAGNOSIS — H353132 Nonexudative age-related macular degeneration, bilateral, intermediate dry stage: Secondary | ICD-10-CM | POA: Diagnosis not present

## 2021-01-09 DIAGNOSIS — M6281 Muscle weakness (generalized): Secondary | ICD-10-CM | POA: Diagnosis not present

## 2021-01-11 DIAGNOSIS — I5189 Other ill-defined heart diseases: Secondary | ICD-10-CM | POA: Diagnosis not present

## 2021-01-11 DIAGNOSIS — I10 Essential (primary) hypertension: Secondary | ICD-10-CM | POA: Diagnosis not present

## 2021-01-11 DIAGNOSIS — B356 Tinea cruris: Secondary | ICD-10-CM | POA: Diagnosis not present

## 2021-01-11 DIAGNOSIS — D649 Anemia, unspecified: Secondary | ICD-10-CM | POA: Diagnosis not present

## 2021-01-11 DIAGNOSIS — K219 Gastro-esophageal reflux disease without esophagitis: Secondary | ICD-10-CM | POA: Diagnosis not present

## 2021-01-11 DIAGNOSIS — M1990 Unspecified osteoarthritis, unspecified site: Secondary | ICD-10-CM | POA: Diagnosis not present

## 2021-01-11 DIAGNOSIS — R5381 Other malaise: Secondary | ICD-10-CM | POA: Diagnosis not present

## 2021-01-15 DIAGNOSIS — M6281 Muscle weakness (generalized): Secondary | ICD-10-CM | POA: Diagnosis not present

## 2021-01-17 DIAGNOSIS — D649 Anemia, unspecified: Secondary | ICD-10-CM | POA: Diagnosis not present

## 2021-01-17 DIAGNOSIS — R5381 Other malaise: Secondary | ICD-10-CM | POA: Diagnosis not present

## 2021-01-17 DIAGNOSIS — R197 Diarrhea, unspecified: Secondary | ICD-10-CM | POA: Diagnosis not present

## 2021-01-17 DIAGNOSIS — I5189 Other ill-defined heart diseases: Secondary | ICD-10-CM | POA: Diagnosis not present

## 2021-01-17 DIAGNOSIS — I10 Essential (primary) hypertension: Secondary | ICD-10-CM | POA: Diagnosis not present

## 2021-01-17 DIAGNOSIS — F418 Other specified anxiety disorders: Secondary | ICD-10-CM | POA: Diagnosis not present

## 2021-01-17 DIAGNOSIS — K219 Gastro-esophageal reflux disease without esophagitis: Secondary | ICD-10-CM | POA: Diagnosis not present

## 2021-01-17 DIAGNOSIS — M1990 Unspecified osteoarthritis, unspecified site: Secondary | ICD-10-CM | POA: Diagnosis not present

## 2021-01-19 DIAGNOSIS — R197 Diarrhea, unspecified: Secondary | ICD-10-CM | POA: Diagnosis not present

## 2021-01-25 DIAGNOSIS — L84 Corns and callosities: Secondary | ICD-10-CM | POA: Diagnosis not present

## 2021-01-25 DIAGNOSIS — I7091 Generalized atherosclerosis: Secondary | ICD-10-CM | POA: Diagnosis not present

## 2021-01-25 DIAGNOSIS — B351 Tinea unguium: Secondary | ICD-10-CM | POA: Diagnosis not present

## 2021-01-29 DIAGNOSIS — R5381 Other malaise: Secondary | ICD-10-CM | POA: Diagnosis not present

## 2021-01-29 DIAGNOSIS — D649 Anemia, unspecified: Secondary | ICD-10-CM | POA: Diagnosis not present

## 2021-01-29 DIAGNOSIS — I10 Essential (primary) hypertension: Secondary | ICD-10-CM | POA: Diagnosis not present

## 2021-01-29 DIAGNOSIS — I5189 Other ill-defined heart diseases: Secondary | ICD-10-CM | POA: Diagnosis not present

## 2021-01-29 DIAGNOSIS — M1990 Unspecified osteoarthritis, unspecified site: Secondary | ICD-10-CM | POA: Diagnosis not present

## 2021-01-29 DIAGNOSIS — K219 Gastro-esophageal reflux disease without esophagitis: Secondary | ICD-10-CM | POA: Diagnosis not present

## 2021-02-03 DIAGNOSIS — D649 Anemia, unspecified: Secondary | ICD-10-CM | POA: Diagnosis not present

## 2021-02-03 DIAGNOSIS — M1990 Unspecified osteoarthritis, unspecified site: Secondary | ICD-10-CM | POA: Diagnosis not present

## 2021-02-03 DIAGNOSIS — I10 Essential (primary) hypertension: Secondary | ICD-10-CM | POA: Diagnosis not present

## 2021-02-05 DIAGNOSIS — M1990 Unspecified osteoarthritis, unspecified site: Secondary | ICD-10-CM | POA: Diagnosis not present

## 2021-02-05 DIAGNOSIS — D649 Anemia, unspecified: Secondary | ICD-10-CM | POA: Diagnosis not present

## 2021-02-05 DIAGNOSIS — R5381 Other malaise: Secondary | ICD-10-CM | POA: Diagnosis not present

## 2021-02-05 DIAGNOSIS — K219 Gastro-esophageal reflux disease without esophagitis: Secondary | ICD-10-CM | POA: Diagnosis not present

## 2021-02-05 DIAGNOSIS — I10 Essential (primary) hypertension: Secondary | ICD-10-CM | POA: Diagnosis not present

## 2021-02-05 DIAGNOSIS — I5189 Other ill-defined heart diseases: Secondary | ICD-10-CM | POA: Diagnosis not present

## 2021-02-08 DIAGNOSIS — I10 Essential (primary) hypertension: Secondary | ICD-10-CM | POA: Diagnosis not present

## 2021-02-08 DIAGNOSIS — M1990 Unspecified osteoarthritis, unspecified site: Secondary | ICD-10-CM | POA: Diagnosis not present

## 2021-02-08 DIAGNOSIS — Z8673 Personal history of transient ischemic attack (TIA), and cerebral infarction without residual deficits: Secondary | ICD-10-CM | POA: Diagnosis not present

## 2021-02-08 DIAGNOSIS — S91114A Laceration without foreign body of right lesser toe(s) without damage to nail, initial encounter: Secondary | ICD-10-CM | POA: Diagnosis not present

## 2021-02-08 DIAGNOSIS — I5189 Other ill-defined heart diseases: Secondary | ICD-10-CM | POA: Diagnosis not present

## 2021-02-08 DIAGNOSIS — R5381 Other malaise: Secondary | ICD-10-CM | POA: Diagnosis not present

## 2021-02-08 DIAGNOSIS — K219 Gastro-esophageal reflux disease without esophagitis: Secondary | ICD-10-CM | POA: Diagnosis not present

## 2021-02-08 DIAGNOSIS — D649 Anemia, unspecified: Secondary | ICD-10-CM | POA: Diagnosis not present

## 2021-02-14 DIAGNOSIS — M6281 Muscle weakness (generalized): Secondary | ICD-10-CM | POA: Diagnosis not present

## 2021-02-20 DIAGNOSIS — S91104A Unspecified open wound of right lesser toe(s) without damage to nail, initial encounter: Secondary | ICD-10-CM | POA: Diagnosis not present

## 2021-02-26 DIAGNOSIS — M1990 Unspecified osteoarthritis, unspecified site: Secondary | ICD-10-CM | POA: Diagnosis not present

## 2021-02-26 DIAGNOSIS — I10 Essential (primary) hypertension: Secondary | ICD-10-CM | POA: Diagnosis not present

## 2021-02-26 DIAGNOSIS — R5381 Other malaise: Secondary | ICD-10-CM | POA: Diagnosis not present

## 2021-02-26 DIAGNOSIS — K219 Gastro-esophageal reflux disease without esophagitis: Secondary | ICD-10-CM | POA: Diagnosis not present

## 2021-02-26 DIAGNOSIS — D649 Anemia, unspecified: Secondary | ICD-10-CM | POA: Diagnosis not present

## 2021-02-26 DIAGNOSIS — S81811A Laceration without foreign body, right lower leg, initial encounter: Secondary | ICD-10-CM | POA: Diagnosis not present

## 2021-02-26 DIAGNOSIS — I5189 Other ill-defined heart diseases: Secondary | ICD-10-CM | POA: Diagnosis not present

## 2021-02-27 DIAGNOSIS — S91104A Unspecified open wound of right lesser toe(s) without damage to nail, initial encounter: Secondary | ICD-10-CM | POA: Diagnosis not present

## 2021-03-06 DIAGNOSIS — S91104A Unspecified open wound of right lesser toe(s) without damage to nail, initial encounter: Secondary | ICD-10-CM | POA: Diagnosis not present

## 2021-03-08 DIAGNOSIS — M1990 Unspecified osteoarthritis, unspecified site: Secondary | ICD-10-CM | POA: Diagnosis not present

## 2021-03-08 DIAGNOSIS — F418 Other specified anxiety disorders: Secondary | ICD-10-CM | POA: Diagnosis not present

## 2021-03-08 DIAGNOSIS — I10 Essential (primary) hypertension: Secondary | ICD-10-CM | POA: Diagnosis not present

## 2021-03-08 DIAGNOSIS — K219 Gastro-esophageal reflux disease without esophagitis: Secondary | ICD-10-CM | POA: Diagnosis not present

## 2021-03-08 DIAGNOSIS — R5381 Other malaise: Secondary | ICD-10-CM | POA: Diagnosis not present

## 2021-03-08 DIAGNOSIS — D649 Anemia, unspecified: Secondary | ICD-10-CM | POA: Diagnosis not present

## 2021-03-08 DIAGNOSIS — I5189 Other ill-defined heart diseases: Secondary | ICD-10-CM | POA: Diagnosis not present

## 2021-03-12 DIAGNOSIS — I10 Essential (primary) hypertension: Secondary | ICD-10-CM | POA: Diagnosis not present

## 2021-03-12 DIAGNOSIS — B029 Zoster without complications: Secondary | ICD-10-CM | POA: Diagnosis not present

## 2021-03-12 DIAGNOSIS — I5189 Other ill-defined heart diseases: Secondary | ICD-10-CM | POA: Diagnosis not present

## 2021-03-12 DIAGNOSIS — R5381 Other malaise: Secondary | ICD-10-CM | POA: Diagnosis not present

## 2021-03-12 DIAGNOSIS — M1990 Unspecified osteoarthritis, unspecified site: Secondary | ICD-10-CM | POA: Diagnosis not present

## 2021-03-12 DIAGNOSIS — D649 Anemia, unspecified: Secondary | ICD-10-CM | POA: Diagnosis not present

## 2021-03-12 DIAGNOSIS — K219 Gastro-esophageal reflux disease without esophagitis: Secondary | ICD-10-CM | POA: Diagnosis not present

## 2021-03-13 DIAGNOSIS — S91104A Unspecified open wound of right lesser toe(s) without damage to nail, initial encounter: Secondary | ICD-10-CM | POA: Diagnosis not present

## 2021-03-14 DIAGNOSIS — M6281 Muscle weakness (generalized): Secondary | ICD-10-CM | POA: Diagnosis not present

## 2021-03-20 DIAGNOSIS — S91104A Unspecified open wound of right lesser toe(s) without damage to nail, initial encounter: Secondary | ICD-10-CM | POA: Diagnosis not present

## 2021-03-26 DIAGNOSIS — I10 Essential (primary) hypertension: Secondary | ICD-10-CM | POA: Diagnosis not present

## 2021-03-26 DIAGNOSIS — I5189 Other ill-defined heart diseases: Secondary | ICD-10-CM | POA: Diagnosis not present

## 2021-03-26 DIAGNOSIS — B029 Zoster without complications: Secondary | ICD-10-CM | POA: Diagnosis not present

## 2021-03-26 DIAGNOSIS — R5381 Other malaise: Secondary | ICD-10-CM | POA: Diagnosis not present

## 2021-03-26 DIAGNOSIS — K219 Gastro-esophageal reflux disease without esophagitis: Secondary | ICD-10-CM | POA: Diagnosis not present

## 2021-03-26 DIAGNOSIS — M1990 Unspecified osteoarthritis, unspecified site: Secondary | ICD-10-CM | POA: Diagnosis not present

## 2021-03-27 DIAGNOSIS — S91104A Unspecified open wound of right lesser toe(s) without damage to nail, initial encounter: Secondary | ICD-10-CM | POA: Diagnosis not present

## 2021-04-03 DIAGNOSIS — S91104A Unspecified open wound of right lesser toe(s) without damage to nail, initial encounter: Secondary | ICD-10-CM | POA: Diagnosis not present

## 2021-04-06 ENCOUNTER — Other Ambulatory Visit: Payer: Self-pay | Admitting: *Deleted

## 2021-04-06 ENCOUNTER — Ambulatory Visit
Admission: RE | Admit: 2021-04-06 | Discharge: 2021-04-06 | Disposition: A | Discharge status: Home / Self Care | Payer: PPO | Source: Ambulatory Visit | Attending: Adult Health | Admitting: Adult Health

## 2021-04-06 ENCOUNTER — Ambulatory Visit: Admission: RE | Admit: 2021-04-06 | Payer: PPO | Source: Ambulatory Visit

## 2021-04-06 DIAGNOSIS — R922 Inconclusive mammogram: Secondary | ICD-10-CM | POA: Diagnosis not present

## 2021-04-06 DIAGNOSIS — C50112 Malignant neoplasm of central portion of left female breast: Secondary | ICD-10-CM

## 2021-04-06 MED ORDER — ANASTROZOLE 1 MG PO TABS: ORAL_TABLET | ORAL | 4 refills | Status: DC

## 2021-04-09 DIAGNOSIS — I10 Essential (primary) hypertension: Secondary | ICD-10-CM | POA: Diagnosis not present

## 2021-04-09 DIAGNOSIS — D649 Anemia, unspecified: Secondary | ICD-10-CM | POA: Diagnosis not present

## 2021-04-09 DIAGNOSIS — M1990 Unspecified osteoarthritis, unspecified site: Secondary | ICD-10-CM | POA: Diagnosis not present

## 2021-04-10 DIAGNOSIS — S91104A Unspecified open wound of right lesser toe(s) without damage to nail, initial encounter: Secondary | ICD-10-CM | POA: Diagnosis not present

## 2021-04-12 DIAGNOSIS — B029 Zoster without complications: Secondary | ICD-10-CM | POA: Diagnosis not present

## 2021-04-12 DIAGNOSIS — M1990 Unspecified osteoarthritis, unspecified site: Secondary | ICD-10-CM | POA: Diagnosis not present

## 2021-04-12 DIAGNOSIS — F418 Other specified anxiety disorders: Secondary | ICD-10-CM | POA: Diagnosis not present

## 2021-04-12 DIAGNOSIS — D649 Anemia, unspecified: Secondary | ICD-10-CM | POA: Diagnosis not present

## 2021-04-12 DIAGNOSIS — K219 Gastro-esophageal reflux disease without esophagitis: Secondary | ICD-10-CM | POA: Diagnosis not present

## 2021-04-13 ENCOUNTER — Encounter: Payer: Self-pay | Admitting: Cardiology

## 2021-04-13 ENCOUNTER — Ambulatory Visit (INDEPENDENT_AMBULATORY_CARE_PROVIDER_SITE_OTHER): Payer: PPO | Admitting: Cardiology

## 2021-04-13 DIAGNOSIS — R600 Localized edema: Secondary | ICD-10-CM

## 2021-04-13 DIAGNOSIS — I10 Essential (primary) hypertension: Secondary | ICD-10-CM | POA: Diagnosis not present

## 2021-04-13 DIAGNOSIS — E782 Mixed hyperlipidemia: Secondary | ICD-10-CM

## 2021-04-13 DIAGNOSIS — Z95828 Presence of other vascular implants and grafts: Secondary | ICD-10-CM | POA: Diagnosis not present

## 2021-04-13 DIAGNOSIS — Z9889 Other specified postprocedural states: Secondary | ICD-10-CM

## 2021-04-13 NOTE — Progress Notes (Signed)
? ? ?Primary Care Provider: Verl Blalock, NP ?Cardiologist: None ?Electrophysiologist: None ? ?Clinic Note: ?Chief Complaint  ?Patient presents with  ? Follow-up  ?  Seems pretty stable.  Still has 1+ edema.  No PND, orthopnea or edema.  ? ? ?=================================== ? ?ASSESSMENT/PLAN  ? ?Problem List Items Addressed This Visit   ? ?  ? Cardiology Problems  ? Mixed hyperlipidemia (Chronic)  ?  Managed by PCP. ? ?  ?  ? Hypertension (Chronic)  ?  Blood pressure well controlled.  Would not be overly aggressive.  She is on low-dose Lopressor. ? ?Adequate hydration, no change medications. => Family member was achieved in the to the rehab. ? ?  ?  ?  ? Other  ? History of right common carotid artery stent placement  ?  On aspirin.  Noted on statin.  No change for now.  He is currently blood pressure controlled. ? ?  ?  ? Lower extremity edema (Chronic)  ?  Very deconditioned.  Echocardiogram was that helpful for strep ? ?Is a cardiac etiology.  My suspicion this is much more related to venous stasis and lower extremity swelling. ?Not overly aggressive with diuresis, but doing well. ?Would prefer foot elevation may potentially support stockings. ? ?Continue Lasix but close monitor renal function. ? ?  ?  ? ? ?=================================== ? ?HPI:   ? ?[name] is a 88 y.o. female with a PMH below for who presents today for follow-up evaluation of [name]. ?She is being seen today at the request of Brianna Pillar, MD / Brianna Blalock, NP ?  ? ?PMH  ?TIA (Carotid Artery Occlusion),  ?Chronic Lower Extremity Edema  ?HTN/HLD ,  ?Chronic Anemia,  ?h/o Breast CA (in remission),   ?Recent COVID-19 infection (June 26-July 20, 2020 along with UTI, and mildly elevated troponin levels. ? ?[name] was last seen on first consultation on 10/06/2020 -> [name] is very debilitated.  She was wheelchair-bound.  Difficult to understand, and pivot to  transfer out of the wheelchair.  She only 1 month of rehab following hospitalization but did not really begin appetite since then.  It was infrequent and not helpful.  More more deconditioned and stable.  Began the note pretty significant edema; noted to have aortic valvular murmur, pedal edema.  She has had a 2D echo ordered back in June 2022 as part of PCP evaluation showed EF of 60 to 65%.  GRII DD.  Mildly elevated BNP.  Moderate LA dilation.  Moderate MAC moderate regurgitation.  Mild AI with mild aortic valve stenosis.  Mean gradient only 8 mmHg.  Mostly sclerosis. ?Continued furosemide 40 mg daily for edema explained pathophysiology of diastolic heart failure and venous stasis.-On my evaluation.  To be related to advanced age. ?Agreed with low-dose beta-blocker, sliding scale Lasix, foot elevation-consider [name].  ? ?Recent Hospitalizations: None ? ? ?Reviewed  CV studies:   ? ?The following studies were reviewed today: (if available, images/films reviewed: From Epic Chart or Care Everywhere) ?No new studies: Not available ? ? ?Interval History:  ? ?[name] returns for follow-up overall pretty stable she has no PND orthopnea but does have 1+ edema.  She remains immobilized and pretty much requires significant assistance. ? ?CV Review of Symptoms (Summary) ?Cardiovascular ROS: no chest pain or dyspnea on exertion ?positive for - dyspnea on exertion, edema, and exertional dyspnea probably is deconditioning and is only mild.  1+ edema. ?negative  for - chest pain, edema, loss of consciousness, orthopnea, or paroxysmal nocturnal dyspnea ? ? ?REVIEWED OF SYSTEMS  ? ?Review of Systems  ?Constitutional:  Positive for malaise/fatigue [name] does have energy.  Very deconditioned.).  ?HENT:  Positive for hearing loss. Negative for congestion and nosebleeds.   ?Respiratory:  Positive for shortness of breath. Negative for cough (Mostly now nonproductive.), hemoptysis and sputum production.    ?Cardiovascular:  Positive for palpitations (Only intermittently.) and leg swelling. Negative for chest pain and orthopnea.  ?Genitourinary:  Negative for flank pain and frequency.  ?Musculoskeletal:  Positive for back pain, joint pain and myalgias. Negative for falls.  ?Neurological:  Positive for dizziness, speech change and weakness.  ?Psychiatric/Behavioral:  Positive for depression and memory loss. The patient is nervous/anxious. The patient does not have insomnia.   ?Constitutional:  Positive for malaise/fatigue (Very deconditioned; essentially wheelchair-bound.).  ? ?I have reviewed and (if needed) personally updated the patient's problem list, medications, allergies, past medical and surgical history, social and family history.  ? ?PAST MEDICAL HISTORY  ? ?Past Medical History:  ?Diagnosis Date  ? Arthritis 04/13/2009  ? Right knee  ? Carotid artery occlusion 07/15/2003  ? Identified at the time of stroke  ? COVID-19 virus infection 07/09/2020  ? Admitted with weakness, found to have UTI and COVID-19.  Treated with 3 days of remdesivir. ->  Remains extremely debilitated  ? Depression   ? Depression   ? GERD (gastroesophageal reflux disease)   ? Hyperlipidemia   ? Hypertension   ? Stroke ([place]) 07/15/2003  ? Mini  ? Thoracic kyphosis   ? ? ?PAST SURGICAL HISTORY  ? ?Past Surgical History:  ?Procedure Laterality Date  ? CATARACT EXTRACTION W/ INTRAOCULAR LENS  IMPLANT, BILATERAL    ? TRANSTHORACIC ECHOCARDIOGRAM  07/10/2020  ? In setting of UTI and COVID-19 infection, mildly elevated <100 hs Troponin (Demand ischemia) - Normal EF 60 to 65%.  No R WMA mild LVH.  GRII DD.  Moderately elevated PAP.  Moderate LA dilation.  Mild to moderate MR.  Moderate TR.  (very) mild aortic stenosis.  (Mean gradient 8 mmHg).  ? ? ?Immunization History  ?Administered Date(s) Administered  ? Influenza Split 11/05/2007, 11/03/2012, 10/17/2013  ? Influenza,inj,Quad PF,6+ Mos 02/11/2020  ? Influenza-Unspecified 10/04/2014,  10/05/2015, 10/10/2016, 10/31/2017, 10/16/2018  ? PFIZER(Purple Top)SARS-COV-2 Vaccination 02/22/2019, 03/15/2019, 04/09/2019, 10/29/2019  ? Pneumococcal Conjugate-13 02/16/2013  ? Pneumococcal Polysaccharide-23 01/15/2003  ? Td 01/14/2001  ? Tdap 08/07/2010  ? ? ?MEDICATIONS/ALLERGIES  ? ?Current Meds  ?Medication Sig  ? acetaminophen (TYLENOL) 325 MG tablet Take 650 mg by mouth every 6 (six) hours as needed.  ? anastrozole (ARIMIDEX) 1 MG tablet TAKE 1 TABLET(1 MG) BY MOUTH DAILY  ? aspirin 81 MG tablet Take 81 mg by mouth daily.  ? diclofenac Sodium (VOLTAREN) 1 % GEL Apply topically.  ? furosemide (LASIX) 40 MG tablet Take 40 mg by mouth daily.  ? metoprolol tartrate (LOPRESSOR) 25 MG tablet Take 25 mg by mouth daily.   ? pantoprazole (PROTONIX) 40 MG tablet Take 1 tablet (40 mg total) by mouth daily at 6 (six) AM.  ? PARoxetine (PAXIL) 30 MG tablet Take 30 mg by mouth daily.   ? Probiotic Product (PROBIOTIC-10 PO) Take 1 tablet by mouth daily.  ? [DISCONTINUED] clonazePAM (KLONOPIN) 0.5 MG tablet Take 1 tablet (0.5 mg total) by mouth every other day for 2 doses.  ? ? ?Allergies  ?Allergen Reactions  ? Fluogen [Influenza Virus Vaccine]   ?  Other reaction(s): didn't tolerate the high dose shot  ? Penicillins   ? Sulfa Antibiotics   ? Codeine Other (See Comments)  ? Penicillin G Rash  ? Prednisone Palpitations  ? Sulfamethoxazole-Trimethoprim Rash  ? ? ?SOCIAL HISTORY/FAMILY HISTORY  ? ?Reviewed in Epic:  ?Pertinent findings:  ?Social History  ? ?Tobacco Use  ? Smoking status: Never  ? Smokeless tobacco: Former  ?  Types: Snuff  ?Vaping Use  ? Vaping Use: Never used  ?Substance Use Topics  ? Alcohol use: No  ?  Alcohol/week: 0.0 standard drinks  ? Drug use: No  ? ?Social History  ? ?Social History Narrative  ? 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 mostly in wheelchair now.  ?   ? NOK/Agent: Daughter: Rob Hickman, (son-in-law Claire Shown); [address], [place] [address]; [phone]; [phone])  ?   ? Additional Emergency Contact:  ? *Konrad Dolores and Lorenza Burton; [address] [place], [place] [address]  ? WP#[phone] Konrad Dolores), [phone]

## 2021-04-13 NOTE — Patient Instructions (Signed)
Medication Instructions:  ?Continue same medications ?*If you need a refill on your cardiac medications before your next appointment, please call your pharmacy* ? ? ?Lab Work: ?None ordered ? ? ?Testing/Procedures: ?None ordered ? ? ?Follow-Up: ?At [facility], you and your health needs are our priority.  As part of our continuing mission to provide you with exceptional heart care, we have created designated Provider Care Teams.  These Care Teams include your primary Cardiologist (physician) and Advanced Practice Providers (APPs -  Physician Assistants and Nurse Practitioners) who all work together to provide you with the care you need, when you need it. ? ?We recommend signing up for the patient portal called "MyChart".  Sign up information is provided on this After Visit Summary.  MyChart is used to connect with patients for Virtual Visits (Telemedicine).  Patients are able to view lab/test results, encounter notes, upcoming appointments, etc.  Non-urgent messages can be sent to your provider as well.   ?To learn more about what you can do with MyChart, go to [email or web address].   ? ?Your next appointment:  1 year ?  ? ?The format for your next appointment: Office ? ? ?Provider:  Dr.Harding ? ? ?

## 2021-04-16 DIAGNOSIS — M6281 Muscle weakness (generalized): Secondary | ICD-10-CM | POA: Diagnosis not present

## 2021-04-17 DIAGNOSIS — S91104A Unspecified open wound of right lesser toe(s) without damage to nail, initial encounter: Secondary | ICD-10-CM | POA: Diagnosis not present

## 2021-04-18 ENCOUNTER — Telehealth: Payer: Self-pay | Admitting: Adult Health

## 2021-04-18 NOTE — Telephone Encounter (Signed)
Rescheduled appointment per provider PAL. Talked with the patients daughter and she is aware of the changes made to the patients upcoming appointment. ?

## 2021-04-23 ENCOUNTER — Other Ambulatory Visit: Payer: Self-pay

## 2021-04-23 DIAGNOSIS — I10 Essential (primary) hypertension: Secondary | ICD-10-CM | POA: Diagnosis not present

## 2021-04-23 DIAGNOSIS — B029 Zoster without complications: Secondary | ICD-10-CM | POA: Diagnosis not present

## 2021-04-23 DIAGNOSIS — Z170 Estrogen receptor positive status [ER+]: Secondary | ICD-10-CM

## 2021-04-23 DIAGNOSIS — D649 Anemia, unspecified: Secondary | ICD-10-CM | POA: Diagnosis not present

## 2021-04-23 DIAGNOSIS — K219 Gastro-esophageal reflux disease without esophagitis: Secondary | ICD-10-CM | POA: Diagnosis not present

## 2021-04-23 DIAGNOSIS — R5381 Other malaise: Secondary | ICD-10-CM | POA: Diagnosis not present

## 2021-04-24 ENCOUNTER — Other Ambulatory Visit: Payer: Self-pay

## 2021-04-24 ENCOUNTER — Encounter: Payer: Self-pay | Admitting: Hematology and Oncology

## 2021-04-24 ENCOUNTER — Inpatient Hospital Stay: Payer: PPO | Attending: Hematology and Oncology

## 2021-04-24 ENCOUNTER — Inpatient Hospital Stay: Payer: PPO

## 2021-04-24 ENCOUNTER — Inpatient Hospital Stay (HOSPITAL_BASED_OUTPATIENT_CLINIC_OR_DEPARTMENT_OTHER): Payer: PPO | Admitting: Hematology and Oncology

## 2021-04-24 VITALS — BP 131/82 | HR 60 | Temp 97.9°F | Resp 19 | Ht 64.0 in | Wt 156.7 lb

## 2021-04-24 DIAGNOSIS — S91104A Unspecified open wound of right lesser toe(s) without damage to nail, initial encounter: Secondary | ICD-10-CM | POA: Diagnosis not present

## 2021-04-24 DIAGNOSIS — C50112 Malignant neoplasm of central portion of left female breast: Secondary | ICD-10-CM | POA: Diagnosis not present

## 2021-04-24 DIAGNOSIS — M8580 Other specified disorders of bone density and structure, unspecified site: Secondary | ICD-10-CM | POA: Diagnosis not present

## 2021-04-24 DIAGNOSIS — M85862 Other specified disorders of bone density and structure, left lower leg: Secondary | ICD-10-CM

## 2021-04-24 DIAGNOSIS — Z170 Estrogen receptor positive status [ER+]: Secondary | ICD-10-CM | POA: Insufficient documentation

## 2021-04-24 LAB — CMP (CANCER CENTER ONLY)
ALT: 10 U/L (ref 0–44)
AST: 17 U/L (ref 15–41)
Albumin: 4 g/dL (ref 3.5–5.0)
Alkaline Phosphatase: 94 U/L (ref 38–126)
Anion gap: 8 (ref 5–15)
BUN: 27 mg/dL — ABNORMAL HIGH (ref 8–23)
CO2: 26 mmol/L (ref 22–32)
Calcium: 9.5 mg/dL (ref 8.9–10.3)
Chloride: 104 mmol/L (ref 98–111)
Creatinine: 1.19 mg/dL — ABNORMAL HIGH (ref 0.44–1.00)
GFR, Estimated: 42 mL/min — ABNORMAL LOW (ref >60)
Glucose, Bld: 97 mg/dL (ref 70–99)
Potassium: 4.3 mmol/L (ref 3.5–5.1)
Sodium: 138 mmol/L (ref 135–145)
Total Bilirubin: 0.3 mg/dL (ref 0.3–1.2)
Total Protein: 7.9 g/dL (ref 6.5–8.1)

## 2021-04-24 LAB — CBC WITH DIFFERENTIAL (CANCER CENTER ONLY)
Abs Immature Granulocytes: 0.03 10*3/uL (ref 0.00–0.07)
Basophils Absolute: 0 10*3/uL (ref 0.0–0.1)
Basophils Relative: 1 %
Eosinophils Absolute: 0.4 10*3/uL (ref 0.0–0.5)
Eosinophils Relative: 5 %
HCT: 34 % — ABNORMAL LOW (ref 36.0–46.0)
Hemoglobin: 11.4 g/dL — ABNORMAL LOW (ref 12.0–15.0)
Immature Granulocytes: 0 %
Lymphocytes Relative: 27 %
Lymphs Abs: 2.1 10*3/uL (ref 0.7–4.0)
MCH: 34.2 pg — ABNORMAL HIGH (ref 26.0–34.0)
MCHC: 33.5 g/dL (ref 30.0–36.0)
MCV: 102.1 fL — ABNORMAL HIGH (ref 80.0–100.0)
Monocytes Absolute: 0.5 10*3/uL (ref 0.1–1.0)
Monocytes Relative: 6 %
Neutro Abs: 5 10*3/uL (ref 1.7–7.7)
Neutrophils Relative %: 61 %
Platelet Count: 230 10*3/uL (ref 150–400)
RBC: 3.33 MIL/uL — ABNORMAL LOW (ref 3.87–5.11)
RDW: 12.7 % (ref 11.5–15.5)
WBC Count: 8.1 10*3/uL (ref 4.0–10.5)
nRBC: 0 % (ref 0.0–0.2)

## 2021-04-24 MED ORDER — ZOLEDRONIC ACID 4 MG/5ML IV CONC
3.0000 mg | Freq: Once | INTRAVENOUS | Status: AC
  Administered 2021-04-24: 3 mg via INTRAVENOUS
  Filled 2021-04-24: qty 3.75

## 2021-04-24 NOTE — Patient Instructions (Signed)

## 2021-04-24 NOTE — Progress Notes (Signed)
?[name]  ?[phone] [phone]  ? ? ?ID: [name] DOB: 09-13-1924  MR#: [id number]  JYN#:[id number] ? ?Patient Care Team: ?Verl Blalock, NP as PCP - General (Adult Health Nurse Practitioner) ?Vickey Huger, MD as Attending Physician (Orthopedic Surgery) ?Magrinat, Virgie Dad, MD (Inactive) as Consulting Physician (Oncology) ?OTHER MD: ? ? ?CHIEF COMPLAINT: estrogen receptor positive breast cancer ? ?CURRENT TREATMENT: anastrozole; zoledronate ? ? ?INTERVAL HISTORY: ?[name] returns today for follow up of her estrogen receptor positive breast cancer accompanied by her daughter [name].  ?She is taking anastrazole as prescribed. ?She denies any new side effects from anastrozole.  She recently had a mammogram which showed stable disease.  She continues to live in a skilled nursing facility.  She has some joint pains especially right knee pain for which she takes some Tylenol.  Rest of the pertinent 10 point ROS reviewed and negative. ? ? ? ? COVID 19 VACCINATION STATUS: Status post Pfizer x3 with booster October 2021 ? ? ?HISTORY OF CURRENT ILLNESS: ?From the original intake note: ? ?[name] had routine screening mammography on 09/28/2018 showing a possible abnormality in the left breast. She underwent unilateral left diagnostic mammography with tomography and left breast ultrasonography at [facility] on 10/05/2018 showing: Breast Density Category B. Spot compression views with tomography of the 6 o'clock axis of the left breast show a dense irregular mass with indistinct margins. On physical exam, no mass is palpated in the 6 o'clock axis of the left breast. The skin appears normal. Targeted ultrasound is performed, showing an irregular heterogeneously hypoechoic mass with peripheral echogenic rim at 6 o'clock position 3 cm from the nipple. The mass is taller than wide and measures 8 x 8 x 6 mm. Vascular flow is in the superficial aspect of the mass.  Ultrasound of the left axilla is negative for lymphadenopathy. ? ?Accordingly on 10/09/2018 she proceeded to biopsy of the left breast area in question. The pathology from this procedure showed ([id number]): invasive ductal carcinoma, Nottingham grade 2 of 3. Prognostic indicators significant for: estrogen receptor, 100% positive and progesterone receptor, 100% positive, both with strong staining intensity. Proliferation marker Ki67 at 10%. HER2 negative (1+) by immunohistochemistry. ? ?The patient's subsequent history is as detailed below. ? ?  ?PAST MEDICAL HISTORY: ?Past Medical History:  ?Diagnosis Date  ? Arthritis 04/13/2009  ? Right knee  ? Carotid artery occlusion 07/15/2003  ? Identified at the time of stroke  ? COVID-19 virus infection 07/09/2020  ? Admitted with weakness, found to have UTI and COVID-19.  Treated with 3 days of remdesivir. ->  Remains extremely debilitated  ? Depression   ? Depression   ? GERD (gastroesophageal reflux disease)   ? Hyperlipidemia   ? Hypertension   ? Stroke ([place]) 07/15/2003  ? Mini  ? Thoracic kyphosis   ? ? ?PAST SURGICAL HISTORY: ?Past Surgical History:  ?Procedure Laterality Date  ? CATARACT EXTRACTION W/ INTRAOCULAR LENS  IMPLANT, BILATERAL    ? TRANSTHORACIC ECHOCARDIOGRAM  07/10/2020  ? In setting of UTI and COVID-19 infection, mildly elevated <100 hs Troponin (Demand ischemia) - Normal EF 60 to 65%.  No R WMA mild LVH.  GRII DD.  Moderately elevated PAP.  Moderate LA dilation.  Mild to moderate MR.  Moderate TR.  (very) mild aortic stenosis.  (Mean gradient 8 mmHg).  ? ? ?FAMILY HISTORY: ?Family History  ?Problem Relation Age of Onset  ? Heart disease Brother   ? Coronary artery  disease Brother   ? Heart disease Brother   ? Heart disease Brother   ? Ovarian cancer Daughter 61  ?     Died at 44  ? Breast cancer Neg Hx   ? ?Sandeep's father died at age 33, sudden death. Patients' mother died at age 46 from "natural causes".. The patient has 3 brothers and 3 sisters.  Patient denies anyone in her family having breast, prostate, or pancreatic cancer. [name]'s oldest daughter was diagnosed with ovarian cancer at the age of 73, and passed at the age of 32.  ? ? ?GYNECOLOGIC HISTORY:  ?No LMP recorded. Patient is postmenopausal. ?Menarche: 88 years old ?Age at first live birth: 88 years old ?GX P: 3 ?LMP: 9 ?HRT: No  ?Hysterectomy?:  No ?BSO?: no  ? ? ?SOCIAL HISTORY: (Current as of 10/26/2018) ?[name] used to work in the [employer].  She is widowed and lives by herself.  Her daughter [name] as noted above died from ovarian cancer at age 33.  Daughter [name], 7, is Network engineer at [employer] across the street from the hospital.  Annie Sable 67 is a retired Agricultural consultant.  The patient has 3 grandchildren and 2 great-grandchildren.  She attends a [employer] ? ? ADVANCED DIRECTIVES:   ? ? ?HEALTH MAINTENANCE: ?Social History  ? ?Tobacco Use  ? Smoking status: Never  ? Smokeless tobacco: Former  ?  Types: Snuff  ?Vaping Use  ? Vaping Use: Never used  ?Substance Use Topics  ? Alcohol use: No  ?  Alcohol/week: 0.0 standard drinks  ? Drug use: No  ? ? Bone density: at Dr. Delene Ruffini 09/12/2011 showed a T score of -1.9 ? ? ?Allergies  ?Allergen Reactions  ? Fluogen [Influenza Virus Vaccine]   ?  Other reaction(s): didn't tolerate the high dose shot  ? Penicillins   ? Sulfa Antibiotics   ? Codeine Other (See Comments)  ? Penicillin G Rash  ? Prednisone Palpitations  ? Sulfamethoxazole-Trimethoprim Rash  ? ? ?Current Outpatient Medications  ?Medication Sig Dispense Refill  ? acetaminophen (TYLENOL) 325 MG tablet Take 650 mg by mouth every 6 (six) hours as needed.    ? anastrozole (ARIMIDEX) 1 MG tablet TAKE 1 TABLET(1 MG) BY MOUTH DAILY 90 tablet 4  ? aspirin 81 MG tablet Take 81 mg by mouth daily.    ? clonazePAM (KLONOPIN) 0.5 MG tablet Take 1 tablet (0.5 mg total) by mouth every other day for 2 doses. 2 tablet 0  ? diclofenac Sodium (VOLTAREN) 1 % GEL Apply topically.     ? furosemide (LASIX) 40 MG tablet Take 40 mg by mouth daily.    ? metoprolol tartrate (LOPRESSOR) 25 MG tablet Take 25 mg by mouth daily.     ? pantoprazole (PROTONIX) 40 MG tablet Take 1 tablet (40 mg total) by mouth daily at 6 (six) AM.    ? PARoxetine (PAXIL) 30 MG tablet Take 30 mg by mouth daily.     ? Probiotic Product (PROBIOTIC-10 PO) Take 1 tablet by mouth daily.    ? ?No current facility-administered medications for this visit.  ? ? ?OBJECTIVE: Elderly white woman examined in a wheelchair ? ?Vitals:  ? 04/24/21 1405  ?BP: 131/82  ?Pulse: 60  ?Resp: 19  ?Temp: 97.9 ?F (36.6 ?C)  ?SpO2: 100%  ? ?Wt Readings from Last 3 Encounters:  ?04/24/21 156 lb 11.2 oz (71.1 kg)  ?04/13/21 154 lb (69.9 kg)  ?10/06/20 156 lb (70.8 kg)  ? ?Body  mass index is 26.9 kg/m?.   ? ?ECOG FS:2 - Symptomatic, <50% confined to bed ? ?Physical Exam ?Constitutional:   ?   Appearance: Normal appearance.  ?Chest:  ?   Comments: Bilateral breasts inspected. ?No palpable masses in left breast. ?No regional adenopathy ?Musculoskeletal:  ?   Cervical back: Normal range of motion and neck supple. No rigidity.  ?Lymphadenopathy:  ?   Cervical: No cervical adenopathy.  ?Skin: ?   General: Skin is warm and dry.  ?Neurological:  ?   Mental Status: She is alert.  ? ? ? ?LAB RESULTS: ? ?CMP  ?   ?Component Value Date/Time  ? NA 138 04/24/2021 1343  ? K 4.3 04/24/2021 1343  ? CL 104 04/24/2021 1343  ? CO2 26 04/24/2021 1343  ? GLUCOSE 97 04/24/2021 1343  ? BUN 27 (H) 04/24/2021 1343  ? CREATININE 1.19 (H) 04/24/2021 1343  ? CALCIUM 9.5 04/24/2021 1343  ? PROT 7.9 04/24/2021 1343  ? ALBUMIN 4.0 04/24/2021 1343  ? AST 17 04/24/2021 1343  ? ALT 10 04/24/2021 1343  ? ALKPHOS 94 04/24/2021 1343  ? BILITOT 0.3 04/24/2021 1343  ? GFRNONAA 42 (L) 04/24/2021 1343  ? GFRAA 43 (L) 04/27/2019 1250  ? ? ?No results found for: TOTALPROTELP, ALBUMINELP, A1GS, A2GS, BETS, BETA2SER, GAMS, MSPIKE, SPEI ? ?No results found for: KPAFRELGTCHN, LAMBDASER,  KAPLAMBRATIO ? ?Lab Results  ?Component Value Date  ? WBC 8.1 04/24/2021  ? NEUTROABS 5.0 04/24/2021  ? HGB 11.4 (L) 04/24/2021  ? HCT 34.0 (L) 04/24/2021  ? MCV 102.1 (H) 04/24/2021  ? PLT 230 04/24/2021  ? ? ?No resu

## 2021-04-26 ENCOUNTER — Ambulatory Visit: Payer: PPO | Admitting: Adult Health

## 2021-04-26 ENCOUNTER — Ambulatory Visit: Payer: PPO

## 2021-04-26 ENCOUNTER — Other Ambulatory Visit: Payer: PPO

## 2021-04-27 ENCOUNTER — Other Ambulatory Visit: Payer: PPO

## 2021-04-27 ENCOUNTER — Ambulatory Visit: Payer: PPO

## 2021-04-27 ENCOUNTER — Ambulatory Visit: Payer: PPO | Admitting: Adult Health

## 2021-04-27 DIAGNOSIS — B351 Tinea unguium: Secondary | ICD-10-CM | POA: Diagnosis not present

## 2021-04-27 DIAGNOSIS — I7091 Generalized atherosclerosis: Secondary | ICD-10-CM | POA: Diagnosis not present

## 2021-04-30 ENCOUNTER — Telehealth: Payer: Self-pay | Admitting: *Deleted

## 2021-04-30 DIAGNOSIS — I10 Essential (primary) hypertension: Secondary | ICD-10-CM | POA: Diagnosis not present

## 2021-04-30 DIAGNOSIS — I5189 Other ill-defined heart diseases: Secondary | ICD-10-CM | POA: Diagnosis not present

## 2021-04-30 DIAGNOSIS — M1990 Unspecified osteoarthritis, unspecified site: Secondary | ICD-10-CM | POA: Diagnosis not present

## 2021-04-30 DIAGNOSIS — D649 Anemia, unspecified: Secondary | ICD-10-CM | POA: Diagnosis not present

## 2021-04-30 DIAGNOSIS — K219 Gastro-esophageal reflux disease without esophagitis: Secondary | ICD-10-CM | POA: Diagnosis not present

## 2021-04-30 NOTE — Telephone Encounter (Signed)
Daughter called- mother is experiencing generalized aching since Zometa infusion 2 days (04/28/21). She said her mother reports pain worse in AM, decreases after taking tylenol.  ?Encouraged continued use of tylenol as directed movement as tolerated. Per daughter, she will contact mother and SNF with this information.Patient currently in SNF, spends most days in wheelchair or lift chair and has PT once/day so has limited mobility.  ?Encouraged daughter to contact office if aching continues or for further concerns. ?Call routed to Dr. Chryl Heck as  Juluis Rainier  ?

## 2021-05-01 DIAGNOSIS — S91104A Unspecified open wound of right lesser toe(s) without damage to nail, initial encounter: Secondary | ICD-10-CM | POA: Diagnosis not present

## 2021-05-08 DIAGNOSIS — I10 Essential (primary) hypertension: Secondary | ICD-10-CM | POA: Diagnosis not present

## 2021-05-08 DIAGNOSIS — M1990 Unspecified osteoarthritis, unspecified site: Secondary | ICD-10-CM | POA: Diagnosis not present

## 2021-05-08 DIAGNOSIS — S91104A Unspecified open wound of right lesser toe(s) without damage to nail, initial encounter: Secondary | ICD-10-CM | POA: Diagnosis not present

## 2021-05-08 DIAGNOSIS — D649 Anemia, unspecified: Secondary | ICD-10-CM | POA: Diagnosis not present

## 2021-05-09 DIAGNOSIS — K219 Gastro-esophageal reflux disease without esophagitis: Secondary | ICD-10-CM | POA: Diagnosis not present

## 2021-05-09 DIAGNOSIS — M1990 Unspecified osteoarthritis, unspecified site: Secondary | ICD-10-CM | POA: Diagnosis not present

## 2021-05-09 DIAGNOSIS — I10 Essential (primary) hypertension: Secondary | ICD-10-CM | POA: Diagnosis not present

## 2021-05-09 DIAGNOSIS — M25561 Pain in right knee: Secondary | ICD-10-CM | POA: Diagnosis not present

## 2021-05-09 DIAGNOSIS — D649 Anemia, unspecified: Secondary | ICD-10-CM | POA: Diagnosis not present

## 2021-05-10 DIAGNOSIS — M25561 Pain in right knee: Secondary | ICD-10-CM | POA: Diagnosis not present

## 2021-05-14 ENCOUNTER — Encounter: Payer: Self-pay | Admitting: Oncology

## 2021-05-14 DIAGNOSIS — R41841 Cognitive communication deficit: Secondary | ICD-10-CM | POA: Diagnosis not present

## 2021-05-14 DIAGNOSIS — R1311 Dysphagia, oral phase: Secondary | ICD-10-CM | POA: Diagnosis not present

## 2021-05-14 DIAGNOSIS — F0390 Unspecified dementia without behavioral disturbance: Secondary | ICD-10-CM | POA: Diagnosis not present

## 2021-05-14 DIAGNOSIS — M6281 Muscle weakness (generalized): Secondary | ICD-10-CM | POA: Diagnosis not present

## 2021-05-18 DIAGNOSIS — M25561 Pain in right knee: Secondary | ICD-10-CM | POA: Diagnosis not present

## 2021-05-18 DIAGNOSIS — G8929 Other chronic pain: Secondary | ICD-10-CM | POA: Diagnosis not present

## 2021-05-19 ENCOUNTER — Encounter: Payer: Self-pay | Admitting: Cardiology

## 2021-05-19 NOTE — Assessment & Plan Note (Addendum)
Blood pressure well controlled.  Would not be overly aggressive.  She is on low-dose Lopressor. ? ?Adequate hydration, no change medications. => Family member was achieved in the to the rehab. ?

## 2021-05-19 NOTE — Assessment & Plan Note (Signed)
Managed by PCP

## 2021-05-19 NOTE — Assessment & Plan Note (Addendum)
Very deconditioned.  Echocardiogram was that helpful for strep ? ?Is a cardiac etiology.  My suspicion this is much more related to venous stasis and lower extremity swelling. ?Not overly aggressive with diuresis, but doing well. ?Would prefer foot elevation may potentially support stockings. ? ?Continue Lasix but close monitor renal function. ?

## 2021-05-19 NOTE — Assessment & Plan Note (Signed)
On aspirin.  Noted on statin.  No change for now.  He is currently blood pressure controlled. ?

## 2021-05-21 DIAGNOSIS — I10 Essential (primary) hypertension: Secondary | ICD-10-CM | POA: Diagnosis not present

## 2021-05-21 DIAGNOSIS — R5381 Other malaise: Secondary | ICD-10-CM | POA: Diagnosis not present

## 2021-05-21 DIAGNOSIS — M1990 Unspecified osteoarthritis, unspecified site: Secondary | ICD-10-CM | POA: Diagnosis not present

## 2021-05-21 DIAGNOSIS — M25561 Pain in right knee: Secondary | ICD-10-CM | POA: Diagnosis not present

## 2021-05-21 DIAGNOSIS — I5189 Other ill-defined heart diseases: Secondary | ICD-10-CM | POA: Diagnosis not present

## 2021-05-21 DIAGNOSIS — K219 Gastro-esophageal reflux disease without esophagitis: Secondary | ICD-10-CM | POA: Diagnosis not present

## 2021-05-21 DIAGNOSIS — D649 Anemia, unspecified: Secondary | ICD-10-CM | POA: Diagnosis not present

## 2021-05-21 DIAGNOSIS — J309 Allergic rhinitis, unspecified: Secondary | ICD-10-CM | POA: Diagnosis not present

## 2021-05-24 DIAGNOSIS — M25561 Pain in right knee: Secondary | ICD-10-CM | POA: Diagnosis not present

## 2021-05-24 DIAGNOSIS — M1990 Unspecified osteoarthritis, unspecified site: Secondary | ICD-10-CM | POA: Diagnosis not present

## 2021-05-24 DIAGNOSIS — I10 Essential (primary) hypertension: Secondary | ICD-10-CM | POA: Diagnosis not present

## 2021-05-24 DIAGNOSIS — D649 Anemia, unspecified: Secondary | ICD-10-CM | POA: Diagnosis not present

## 2021-05-24 DIAGNOSIS — J309 Allergic rhinitis, unspecified: Secondary | ICD-10-CM | POA: Diagnosis not present

## 2021-05-24 DIAGNOSIS — I5189 Other ill-defined heart diseases: Secondary | ICD-10-CM | POA: Diagnosis not present

## 2021-05-24 DIAGNOSIS — R5381 Other malaise: Secondary | ICD-10-CM | POA: Diagnosis not present

## 2021-05-24 DIAGNOSIS — K219 Gastro-esophageal reflux disease without esophagitis: Secondary | ICD-10-CM | POA: Diagnosis not present

## 2021-06-04 DIAGNOSIS — I10 Essential (primary) hypertension: Secondary | ICD-10-CM | POA: Diagnosis not present

## 2021-06-04 DIAGNOSIS — I5189 Other ill-defined heart diseases: Secondary | ICD-10-CM | POA: Diagnosis not present

## 2021-06-04 DIAGNOSIS — M1990 Unspecified osteoarthritis, unspecified site: Secondary | ICD-10-CM | POA: Diagnosis not present

## 2021-06-04 DIAGNOSIS — R5381 Other malaise: Secondary | ICD-10-CM | POA: Diagnosis not present

## 2021-06-04 DIAGNOSIS — D649 Anemia, unspecified: Secondary | ICD-10-CM | POA: Diagnosis not present

## 2021-06-04 DIAGNOSIS — M25561 Pain in right knee: Secondary | ICD-10-CM | POA: Diagnosis not present

## 2021-06-04 DIAGNOSIS — J309 Allergic rhinitis, unspecified: Secondary | ICD-10-CM | POA: Diagnosis not present

## 2021-06-04 DIAGNOSIS — K219 Gastro-esophageal reflux disease without esophagitis: Secondary | ICD-10-CM | POA: Diagnosis not present

## 2021-06-05 DIAGNOSIS — M1990 Unspecified osteoarthritis, unspecified site: Secondary | ICD-10-CM | POA: Diagnosis not present

## 2021-06-05 DIAGNOSIS — D649 Anemia, unspecified: Secondary | ICD-10-CM | POA: Diagnosis not present

## 2021-06-05 DIAGNOSIS — I10 Essential (primary) hypertension: Secondary | ICD-10-CM | POA: Diagnosis not present

## 2021-06-14 DIAGNOSIS — M6281 Muscle weakness (generalized): Secondary | ICD-10-CM | POA: Diagnosis not present

## 2021-06-18 DIAGNOSIS — I10 Essential (primary) hypertension: Secondary | ICD-10-CM | POA: Diagnosis not present

## 2021-06-18 DIAGNOSIS — M1990 Unspecified osteoarthritis, unspecified site: Secondary | ICD-10-CM | POA: Diagnosis not present

## 2021-06-18 DIAGNOSIS — M25561 Pain in right knee: Secondary | ICD-10-CM | POA: Diagnosis not present

## 2021-06-18 DIAGNOSIS — B370 Candidal stomatitis: Secondary | ICD-10-CM | POA: Diagnosis not present

## 2021-06-18 DIAGNOSIS — K219 Gastro-esophageal reflux disease without esophagitis: Secondary | ICD-10-CM | POA: Diagnosis not present

## 2021-06-18 DIAGNOSIS — J309 Allergic rhinitis, unspecified: Secondary | ICD-10-CM | POA: Diagnosis not present

## 2021-06-18 DIAGNOSIS — R5381 Other malaise: Secondary | ICD-10-CM | POA: Diagnosis not present

## 2021-06-18 DIAGNOSIS — D649 Anemia, unspecified: Secondary | ICD-10-CM | POA: Diagnosis not present

## 2021-06-21 DIAGNOSIS — R5381 Other malaise: Secondary | ICD-10-CM | POA: Diagnosis not present

## 2021-06-21 DIAGNOSIS — I5189 Other ill-defined heart diseases: Secondary | ICD-10-CM | POA: Diagnosis not present

## 2021-06-21 DIAGNOSIS — J309 Allergic rhinitis, unspecified: Secondary | ICD-10-CM | POA: Diagnosis not present

## 2021-06-21 DIAGNOSIS — D649 Anemia, unspecified: Secondary | ICD-10-CM | POA: Diagnosis not present

## 2021-06-21 DIAGNOSIS — I10 Essential (primary) hypertension: Secondary | ICD-10-CM | POA: Diagnosis not present

## 2021-06-21 DIAGNOSIS — M1990 Unspecified osteoarthritis, unspecified site: Secondary | ICD-10-CM | POA: Diagnosis not present

## 2021-06-21 DIAGNOSIS — M25561 Pain in right knee: Secondary | ICD-10-CM | POA: Diagnosis not present

## 2021-06-21 DIAGNOSIS — F418 Other specified anxiety disorders: Secondary | ICD-10-CM | POA: Diagnosis not present

## 2021-06-21 DIAGNOSIS — K219 Gastro-esophageal reflux disease without esophagitis: Secondary | ICD-10-CM | POA: Diagnosis not present

## 2021-06-25 DIAGNOSIS — F32A Depression, unspecified: Secondary | ICD-10-CM | POA: Diagnosis not present

## 2021-06-25 DIAGNOSIS — K219 Gastro-esophageal reflux disease without esophagitis: Secondary | ICD-10-CM | POA: Diagnosis not present

## 2021-06-25 DIAGNOSIS — I10 Essential (primary) hypertension: Secondary | ICD-10-CM | POA: Diagnosis not present

## 2021-06-25 DIAGNOSIS — M1990 Unspecified osteoarthritis, unspecified site: Secondary | ICD-10-CM | POA: Diagnosis not present

## 2021-06-25 DIAGNOSIS — M25561 Pain in right knee: Secondary | ICD-10-CM | POA: Diagnosis not present

## 2021-06-25 DIAGNOSIS — L309 Dermatitis, unspecified: Secondary | ICD-10-CM | POA: Diagnosis not present

## 2021-06-25 DIAGNOSIS — B370 Candidal stomatitis: Secondary | ICD-10-CM | POA: Diagnosis not present

## 2021-06-26 DIAGNOSIS — M79632 Pain in left forearm: Secondary | ICD-10-CM | POA: Diagnosis not present

## 2021-06-26 DIAGNOSIS — M25532 Pain in left wrist: Secondary | ICD-10-CM | POA: Diagnosis not present

## 2021-06-27 DIAGNOSIS — K219 Gastro-esophageal reflux disease without esophagitis: Secondary | ICD-10-CM | POA: Diagnosis not present

## 2021-06-27 DIAGNOSIS — M1990 Unspecified osteoarthritis, unspecified site: Secondary | ICD-10-CM | POA: Diagnosis not present

## 2021-06-27 DIAGNOSIS — F419 Anxiety disorder, unspecified: Secondary | ICD-10-CM | POA: Diagnosis not present

## 2021-06-27 DIAGNOSIS — L309 Dermatitis, unspecified: Secondary | ICD-10-CM | POA: Diagnosis not present

## 2021-06-27 DIAGNOSIS — B370 Candidal stomatitis: Secondary | ICD-10-CM | POA: Diagnosis not present

## 2021-06-27 DIAGNOSIS — R5381 Other malaise: Secondary | ICD-10-CM | POA: Diagnosis not present

## 2021-06-27 DIAGNOSIS — D649 Anemia, unspecified: Secondary | ICD-10-CM | POA: Diagnosis not present

## 2021-06-27 DIAGNOSIS — M25561 Pain in right knee: Secondary | ICD-10-CM | POA: Diagnosis not present

## 2021-07-04 DIAGNOSIS — D649 Anemia, unspecified: Secondary | ICD-10-CM | POA: Diagnosis not present

## 2021-07-04 DIAGNOSIS — I10 Essential (primary) hypertension: Secondary | ICD-10-CM | POA: Diagnosis not present

## 2021-07-04 DIAGNOSIS — M1990 Unspecified osteoarthritis, unspecified site: Secondary | ICD-10-CM | POA: Diagnosis not present

## 2021-07-05 DIAGNOSIS — I7091 Generalized atherosclerosis: Secondary | ICD-10-CM | POA: Diagnosis not present

## 2021-07-05 DIAGNOSIS — M2042 Other hammer toe(s) (acquired), left foot: Secondary | ICD-10-CM | POA: Diagnosis not present

## 2021-07-05 DIAGNOSIS — B351 Tinea unguium: Secondary | ICD-10-CM | POA: Diagnosis not present

## 2021-07-05 DIAGNOSIS — M2041 Other hammer toe(s) (acquired), right foot: Secondary | ICD-10-CM | POA: Diagnosis not present

## 2021-07-09 DIAGNOSIS — I10 Essential (primary) hypertension: Secondary | ICD-10-CM | POA: Diagnosis not present

## 2021-07-12 DIAGNOSIS — I10 Essential (primary) hypertension: Secondary | ICD-10-CM | POA: Diagnosis not present

## 2021-07-14 DIAGNOSIS — M6281 Muscle weakness (generalized): Secondary | ICD-10-CM | POA: Diagnosis not present

## 2021-07-16 DIAGNOSIS — I5189 Other ill-defined heart diseases: Secondary | ICD-10-CM | POA: Diagnosis not present

## 2021-07-16 DIAGNOSIS — M1990 Unspecified osteoarthritis, unspecified site: Secondary | ICD-10-CM | POA: Diagnosis not present

## 2021-07-16 DIAGNOSIS — F419 Anxiety disorder, unspecified: Secondary | ICD-10-CM | POA: Diagnosis not present

## 2021-07-16 DIAGNOSIS — K219 Gastro-esophageal reflux disease without esophagitis: Secondary | ICD-10-CM | POA: Diagnosis not present

## 2021-07-16 DIAGNOSIS — F32A Depression, unspecified: Secondary | ICD-10-CM | POA: Diagnosis not present

## 2021-07-16 DIAGNOSIS — I10 Essential (primary) hypertension: Secondary | ICD-10-CM | POA: Diagnosis not present

## 2021-07-19 DIAGNOSIS — D649 Anemia, unspecified: Secondary | ICD-10-CM | POA: Diagnosis not present

## 2021-07-19 DIAGNOSIS — M1990 Unspecified osteoarthritis, unspecified site: Secondary | ICD-10-CM | POA: Diagnosis not present

## 2021-07-19 DIAGNOSIS — K219 Gastro-esophageal reflux disease without esophagitis: Secondary | ICD-10-CM | POA: Diagnosis not present

## 2021-07-19 DIAGNOSIS — I10 Essential (primary) hypertension: Secondary | ICD-10-CM | POA: Diagnosis not present

## 2021-07-27 DIAGNOSIS — H35372 Puckering of macula, left eye: Secondary | ICD-10-CM | POA: Diagnosis not present

## 2021-07-27 DIAGNOSIS — H18513 Endothelial corneal dystrophy, bilateral: Secondary | ICD-10-CM | POA: Diagnosis not present

## 2021-07-27 DIAGNOSIS — H353132 Nonexudative age-related macular degeneration, bilateral, intermediate dry stage: Secondary | ICD-10-CM | POA: Diagnosis not present

## 2021-08-07 DIAGNOSIS — M1990 Unspecified osteoarthritis, unspecified site: Secondary | ICD-10-CM | POA: Diagnosis not present

## 2021-08-07 DIAGNOSIS — D649 Anemia, unspecified: Secondary | ICD-10-CM | POA: Diagnosis not present

## 2021-08-07 DIAGNOSIS — I10 Essential (primary) hypertension: Secondary | ICD-10-CM | POA: Diagnosis not present

## 2021-08-11 DIAGNOSIS — M25552 Pain in left hip: Secondary | ICD-10-CM | POA: Diagnosis not present

## 2021-08-11 DIAGNOSIS — R102 Pelvic and perineal pain: Secondary | ICD-10-CM | POA: Diagnosis not present

## 2021-08-11 DIAGNOSIS — M25551 Pain in right hip: Secondary | ICD-10-CM | POA: Diagnosis not present

## 2021-08-13 DIAGNOSIS — R100 Acute abdomen: Secondary | ICD-10-CM | POA: Diagnosis not present

## 2021-08-13 DIAGNOSIS — R079 Chest pain, unspecified: Secondary | ICD-10-CM | POA: Diagnosis not present

## 2021-08-13 DIAGNOSIS — K219 Gastro-esophageal reflux disease without esophagitis: Secondary | ICD-10-CM | POA: Diagnosis not present

## 2021-08-13 DIAGNOSIS — D649 Anemia, unspecified: Secondary | ICD-10-CM | POA: Diagnosis not present

## 2021-08-13 DIAGNOSIS — F419 Anxiety disorder, unspecified: Secondary | ICD-10-CM | POA: Diagnosis not present

## 2021-08-13 DIAGNOSIS — I10 Essential (primary) hypertension: Secondary | ICD-10-CM | POA: Diagnosis not present

## 2021-08-15 DIAGNOSIS — U071 COVID-19: Secondary | ICD-10-CM | POA: Diagnosis not present

## 2021-08-30 DIAGNOSIS — M1990 Unspecified osteoarthritis, unspecified site: Secondary | ICD-10-CM | POA: Diagnosis not present

## 2021-08-30 DIAGNOSIS — D649 Anemia, unspecified: Secondary | ICD-10-CM | POA: Diagnosis not present

## 2021-08-30 DIAGNOSIS — I10 Essential (primary) hypertension: Secondary | ICD-10-CM | POA: Diagnosis not present

## 2021-08-30 DIAGNOSIS — I5189 Other ill-defined heart diseases: Secondary | ICD-10-CM | POA: Diagnosis not present

## 2021-08-30 DIAGNOSIS — K219 Gastro-esophageal reflux disease without esophagitis: Secondary | ICD-10-CM | POA: Diagnosis not present

## 2021-08-30 DIAGNOSIS — F32A Depression, unspecified: Secondary | ICD-10-CM | POA: Diagnosis not present

## 2021-08-30 DIAGNOSIS — R100 Acute abdomen: Secondary | ICD-10-CM | POA: Diagnosis not present

## 2021-09-03 DIAGNOSIS — M79605 Pain in left leg: Secondary | ICD-10-CM | POA: Diagnosis not present

## 2021-09-03 DIAGNOSIS — K1370 Unspecified lesions of oral mucosa: Secondary | ICD-10-CM | POA: Diagnosis not present

## 2021-09-03 DIAGNOSIS — K219 Gastro-esophageal reflux disease without esophagitis: Secondary | ICD-10-CM | POA: Diagnosis not present

## 2021-09-03 DIAGNOSIS — M25562 Pain in left knee: Secondary | ICD-10-CM | POA: Diagnosis not present

## 2021-09-03 DIAGNOSIS — M1990 Unspecified osteoarthritis, unspecified site: Secondary | ICD-10-CM | POA: Diagnosis not present

## 2021-09-03 DIAGNOSIS — M79662 Pain in left lower leg: Secondary | ICD-10-CM | POA: Diagnosis not present

## 2021-09-03 DIAGNOSIS — R58 Hemorrhage, not elsewhere classified: Secondary | ICD-10-CM | POA: Diagnosis not present

## 2021-09-03 DIAGNOSIS — I5189 Other ill-defined heart diseases: Secondary | ICD-10-CM | POA: Diagnosis not present

## 2021-09-03 DIAGNOSIS — U071 COVID-19: Secondary | ICD-10-CM | POA: Diagnosis not present

## 2021-09-03 DIAGNOSIS — I10 Essential (primary) hypertension: Secondary | ICD-10-CM | POA: Diagnosis not present

## 2021-09-04 DIAGNOSIS — I7091 Generalized atherosclerosis: Secondary | ICD-10-CM | POA: Diagnosis not present

## 2021-09-04 DIAGNOSIS — B351 Tinea unguium: Secondary | ICD-10-CM | POA: Diagnosis not present

## 2021-09-04 DIAGNOSIS — C069 Malignant neoplasm of mouth, unspecified: Secondary | ICD-10-CM | POA: Diagnosis not present

## 2021-09-04 DIAGNOSIS — K1329 Other disturbances of oral epithelium, including tongue: Secondary | ICD-10-CM | POA: Diagnosis not present

## 2021-09-06 DIAGNOSIS — D649 Anemia, unspecified: Secondary | ICD-10-CM | POA: Diagnosis not present

## 2021-09-06 DIAGNOSIS — I10 Essential (primary) hypertension: Secondary | ICD-10-CM | POA: Diagnosis not present

## 2021-09-06 DIAGNOSIS — M1990 Unspecified osteoarthritis, unspecified site: Secondary | ICD-10-CM | POA: Diagnosis not present

## 2021-09-09 DIAGNOSIS — C069 Malignant neoplasm of mouth, unspecified: Secondary | ICD-10-CM | POA: Diagnosis present

## 2021-09-10 DIAGNOSIS — C410 Malignant neoplasm of bones of skull and face: Secondary | ICD-10-CM | POA: Diagnosis not present

## 2021-09-10 DIAGNOSIS — C069 Malignant neoplasm of mouth, unspecified: Secondary | ICD-10-CM | POA: Diagnosis not present

## 2021-09-11 DIAGNOSIS — C069 Malignant neoplasm of mouth, unspecified: Secondary | ICD-10-CM | POA: Diagnosis not present

## 2021-09-12 DIAGNOSIS — Z23 Encounter for immunization: Secondary | ICD-10-CM | POA: Diagnosis not present

## 2021-09-14 ENCOUNTER — Non-Acute Institutional Stay: Payer: PPO | Admitting: Family Medicine

## 2021-09-14 ENCOUNTER — Encounter: Payer: Self-pay | Admitting: Family Medicine

## 2021-09-14 VITALS — BP 112/68 | HR 68 | Resp 20 | Wt 152.4 lb

## 2021-09-14 DIAGNOSIS — C069 Malignant neoplasm of mouth, unspecified: Secondary | ICD-10-CM | POA: Diagnosis not present

## 2021-09-14 DIAGNOSIS — Z515 Encounter for palliative care: Secondary | ICD-10-CM

## 2021-09-14 DIAGNOSIS — F03A0 Unspecified dementia, mild, without behavioral disturbance, psychotic disturbance, mood disturbance, and anxiety: Secondary | ICD-10-CM

## 2021-09-14 NOTE — Progress Notes (Unsigned)
[facility] Consult Note Telephone: [phone]  Fax: [phone]   Date of encounter: 09/14/21 11:51 AM PATIENT NAME: [name] [id number]   [phone] (home)  DOB: [birth year] MRN: [id number] PRIMARY CARE PROVIDER:    Verl Blalock, NP,  [address] [place] [address] [phone]  REFERRING PROVIDER:   Verl Blalock, NP [address] [place],  [place] [address] [phone]  RESPONSIBLE PARTY:    Contact Information     Name Relation Home Work [place] Daughter [phone]  [phone]        I met face to face with patient and in her AL facility. Spoke with her daughter Rob Hickman by phone.  Palliative Care was asked to follow this patient by consultation request of  Verl Blalock, NP to address advance care planning and complex medical decision making. This is the initial visit.          ASSESSMENT, SYMPTOM MANAGEMENT AND PLAN / RECOMMENDATIONS:     Follow up Palliative Care Visit: Palliative care will continue to follow for complex medical decision making, advance care planning, and clarification of goals. Return 1 weeks or prn.    This visit was coded based on medical decision making (MDM).  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  Palliative Care is also follow to assist with advance directive  planning and defining/refining goals of care.   HISTORY OF PRESENT ILLNESS:  [name] is a 88 y.o. year old female  with *** .  Pt states her appetite is good, daughter states her food is pureed.  She denies pain, SOB, dysuria, nausea/vomiting or constipation.  Her weight has overall  been very stable in the 147-152 range.  Denies falls.  Has to have help bathing and dressing, able to ambulate only small distances.  Pt has DNR on chart which daughter confirms.  ENT who is evaluating her oral cancer is from [facility], has indicated after doing scans that cancer is confined to the gums, not in her bone or nasal cavity.  Daughter wants to meet with both pt and Palliative NP to discuss the surgical options.  Daughter wants pt to do surgery only if the patient wants to but feels like her mother is agreeing only for her.  ENT has indicated that pt will have some pain and have to have a prostodentic appliance made but she should be able to eat with it and just take it out at night.  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  Physician has advised pt and family that she is not a candidate for chemotherapy, radiotherapy and has low chance of success with immunotherapy so ideal treatment would be surgical excision which would leave her with a significant oral defect.  Plan was to perform CT chest and rediscuss treatment options.   History obtained from review of EMR, discussion with primary team, and interview with family, facility staff/caregiver and/or [name].  I reviewed EMR for available labs, medications, imaging, studies and related documents.  No new records since last visit/Records reviewed  and summarized above.   ROS General: NAD EYES: denies vision changes ENMT: denies dysphagia Cardiovascular: denies chest pain, denies DOE Pulmonary: denies cough, denies increased SOB Abdomen: endorses good appetite, denies constipation, endorses continence of bowel GU: denies dysuria, endorses continence of urine MSK:  denies increased weakness, no falls reported Skin: denies rashes or wounds Neurological: denies pain, denies insomnia Psych: Endorses positive  mood Heme/lymph/immuno: denies bruises, abnormal bleeding  Physical Exam: Current and past weights: Constitutional: NAD General: frail appearing, thin/WNWD/obese  EYES: anicteric sclera, lids intact, no discharge  ENMT: intact hearing, oral mucous membranes moist, dentition intact CV: S1S2, RRR, no LE edema Pulmonary: CTAB, no increased work of breathing, no cough, room air Abdomen: intake 100%, normo-active BS + 4 quadrants, soft and non tender, no ascites GU: deferred MSK: no sarcopenia, moves all extremities, ambulatory Skin: warm and dry, no rashes or wounds on visible skin Neuro:  no generalized weakness, no cognitive impairment Psych: non-anxious affect, A and O x 3 Hem/lymph/immuno: no widespread bruising  CURRENT PROBLEM LIST:  Patient Active Problem List   Diagnosis Date Noted   Diastolic dysfunction without heart failure 10/07/2020   Generalized weakness 07/10/2020   Fall at home, initial encounter 07/10/2020   COVID-19 virus infection 07/10/2020   Lower extremity edema 07/10/2020   Elevated troponin level not due myocardial infarction 07/10/2020   Hypertension    Anemia    Acute cystitis 07/09/2020   Stage 3 chronic kidney disease ([place]) 04/22/2020   Prediabetes 04/22/2020   Personal history of transient ischemic attack (TIA), and cerebral infarction without residual deficits 04/22/2020   Osteoarthritis 04/22/2020   Mixed hyperlipidemia 04/22/2020   Malnutrition ([place]) 04/22/2020   Major depression, single episode 04/22/2020   Edema 04/22/2020   Disorder of arteries and arterioles, unspecified ([place]) 04/22/2020   Dementia ([place]) 04/22/2020   Genetic testing 11/13/2018   Malignant neoplasm of central portion of left breast in female, estrogen receptor positive ([place]) 10/26/2018   Osteopenia of lower leg 10/26/2018   Neck pain 05/05/2015   History of right common carotid artery stent placement 05/27/2012   PAST MEDICAL HISTORY:  Active Ambulatory Problems     Diagnosis Date Noted   History of right common carotid artery stent placement 05/27/2012   Malignant neoplasm of central portion of left breast in female, estrogen receptor positive ([place]) 10/26/2018   Osteopenia of lower leg 10/26/2018   Genetic testing 11/13/2018   Neck pain 05/05/2015   Stage 3 chronic kidney disease ([place]) 04/22/2020   Prediabetes 04/22/2020   Personal history of transient ischemic attack (TIA), and cerebral infarction without residual deficits 04/22/2020   Osteoarthritis 04/22/2020   Mixed hyperlipidemia 04/22/2020   Malnutrition ([place]) 04/22/2020   Major depression, single episode 04/22/2020   Edema 04/22/2020   Disorder of arteries and arterioles, unspecified ([place]) 04/22/2020   Dementia ([place]) 04/22/2020   Acute cystitis 07/09/2020   Generalized weakness 07/10/2020   Fall at home, initial encounter 07/10/2020   COVID-19 virus infection 07/10/2020   Lower extremity edema 07/10/2020   Elevated troponin level not due myocardial infarction 07/10/2020   Hypertension    Anemia    Diastolic dysfunction without heart failure 10/07/2020   Resolved Ambulatory Problems    Diagnosis Date Noted   No Resolved Ambulatory Problems   Past Medical History:  Diagnosis Date   Arthritis 04/13/2009   Carotid artery occlusion 07/15/2003   Depression    Depression    GERD (gastroesophageal reflux disease)    Hyperlipidemia  Stroke ([place]) 07/15/2003   Thoracic kyphosis    SOCIAL HX:  Social History   Tobacco Use   Smoking status: Never   Smokeless tobacco: Former    Types: Snuff  Substance Use Topics   Alcohol use: No    Alcohol/week: 0.0 standard drinks of alcohol   FAMILY HX:  Family History  Problem Relation Age of Onset   Heart disease Brother    Coronary artery disease Brother    Heart disease Brother    Heart disease Brother    Ovarian cancer Daughter 39       Died at 32   Breast cancer Neg Hx     ***   Preferred Pharmacy: [facility]:  Allergies   Allergen Reactions   Fluogen [Influenza Virus Vaccine]     Other reaction(s): didn't tolerate the high dose shot   Penicillins    Sulfa Antibiotics    Codeine Other (See Comments)   Penicillin G Rash   Prednisone Palpitations   Sulfamethoxazole-Trimethoprim Rash     PERTINENT MEDICATIONS:  Outpatient Encounter Medications as of 09/14/2021  Medication Sig   acetaminophen (TYLENOL) 325 MG tablet Take 650 mg by mouth every 6 (six) hours as needed.   anastrozole (ARIMIDEX) 1 MG tablet TAKE 1 TABLET(1 MG) BY MOUTH DAILY   aspirin 81 MG tablet Take 81 mg by mouth daily.   diclofenac Sodium (VOLTAREN) 1 % GEL Apply topically.   furosemide (LASIX) 40 MG tablet Take 40 mg by mouth daily.   metoprolol tartrate (LOPRESSOR) 25 MG tablet Take 25 mg by mouth daily.    pantoprazole (PROTONIX) 40 MG tablet Take 1 tablet (40 mg total) by mouth daily at 6 (six) AM.   PARoxetine (PAXIL) 30 MG tablet Take 30 mg by mouth daily.    Probiotic Product (PROBIOTIC-10 PO) Take 1 tablet by mouth daily.   No facility-administered encounter medications on file as of 09/14/2021.     -------------------------------------------------------------------------------------------------------------------------------------------------------------------------------------------------------------------------------------------- Advance Care Planning/Goals of Care: Goals include to maximize quality of life and symptom management. Patient/health care surrogate gave his/her permission to discuss.Our advance care planning conversation included a discussion about:    The value and importance of advance care planning  Experiences with loved ones who have been seriously ill or have died  Exploration of personal, cultural or spiritual beliefs that might influence medical decisions  Exploration of goals of care in the event of a sudden injury or illness  Identification  of a healthcare agent  Review and updating or creation of an   advance directive document . Decision not to resuscitate or to de-escalate disease focused treatments due to poor prognosis. CODE STATUS:     Thank you for the opportunity to participate in the care of Ms. Mollica.  The palliative care team will continue to follow. Please call our office at [phone] if we can be of additional assistance.   Marijo Conception, FNP-C  COVID-19 PATIENT SCREENING TOOL Asked and negative response unless otherwise noted:  Have you had symptoms of covid, tested positive or been in contact with someone with symptoms/positive test in the past 5-10 days?

## 2021-09-17 ENCOUNTER — Encounter: Payer: Self-pay | Admitting: Family Medicine

## 2021-09-17 DIAGNOSIS — Z515 Encounter for palliative care: Secondary | ICD-10-CM | POA: Insufficient documentation

## 2021-09-17 DIAGNOSIS — C069 Malignant neoplasm of mouth, unspecified: Secondary | ICD-10-CM | POA: Insufficient documentation

## 2021-09-18 ENCOUNTER — Encounter: Payer: Self-pay | Admitting: Family Medicine

## 2021-09-18 ENCOUNTER — Non-Acute Institutional Stay: Payer: PPO | Admitting: Family Medicine

## 2021-09-18 VITALS — BP 110/76 | HR 94 | Resp 18

## 2021-09-18 DIAGNOSIS — C069 Malignant neoplasm of mouth, unspecified: Secondary | ICD-10-CM | POA: Diagnosis not present

## 2021-09-18 DIAGNOSIS — Z515 Encounter for palliative care: Secondary | ICD-10-CM

## 2021-09-18 DIAGNOSIS — E441 Mild protein-calorie malnutrition: Secondary | ICD-10-CM | POA: Diagnosis not present

## 2021-09-18 NOTE — Progress Notes (Signed)
[facility] Consult Note Telephone: [phone]  Fax: [phone]   Date of encounter: 09/18/21 1:37 PM PATIENT NAME: [name] [address] [place] [address]   [phone] (home)  DOB: [birth year] MRN: [id number] PRIMARY CARE PROVIDER:    Verl Blalock, NP,  [address] [place] [address] [phone]  REFERRING PROVIDER:   Verl Blalock, NP [address] [place],  [place] [address] [phone]  RESPONSIBLE PARTY:    Contact Information     Name Relation Home Work [name] Daughter [phone]  [phone]        I met face to face with patient and in her AL facility. Spoke with her daughter Rob Hickman by phone.  Palliative Care was asked to follow this patient by consultation request of  Verl Blalock, NP to address advance care planning and complex medical decision making. This is the initial visit.          ASSESSMENT, SYMPTOM MANAGEMENT AND PLAN / RECOMMENDATIONS:   Primary oral squamos cell carcinoma Reviewed treatment with surgical excision and development of prosthetic to cover excised area during the day vs no treatment with eventual progression of the cancer, difficulty swallowing, eating and potentially breathing. Last Albumin in 04/24/21 was 4.0.  Pt wants to proceed with surgical excision.   Palliative Care Encounter MOST reviewed but daughter wants to discuss with her brother (pt's son) Reminded both pt and daughter that DNR and MOST dont apply during surgery   Mild Dementia without behavioral disturbance FAST 7 score 6c Advised that likely medications and anesthesia will cause at least temporary worsening in dementia symptoms.    Follow up Palliative Care Visit: Palliative care will continue to follow for complex medical decision making, advance care planning, and clarification of goals. Return 4 weeks or prn.    This  visit was coded based on medical decision making (MDM).  PPS: 50%  HOSPICE ELIGIBILITY/DIAGNOSIS: TBD  Chief Complaint:  Palliative Care is following to assist primary caregiver with support to discuss treatment options for new oral cancer lesion and review advance directive planning and goals of care.  HISTORY OF PRESENT ILLNESS:  [name] is a 88 y.o. year old female with dementia with hx of ER positive breast cancer of left breast, HTN, osteopenia, OA, Stage 3 CKD, HLD, diastolic dysfunction without heart failure, right common carotid artery stent, hx of TIA, depression, weakness, anemia and malnutrition.  She has recently been diagnosed with oral Squamos cell cancer and is undergoing evaluation for that currently with no invasion of the bone or sinuses. She denies pain, SOB, dysuria, nausea/vomiting.  Denies falls.   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   Multiple reassurances given to patient who states "I did this" in reference to her oral squamos cell carcinoma because she used to dip snuff.  Advised pt that her cancer could be a result of using snuff but there were many choices people make daily that could affect her health. She denies significant pain, was informed multiple times that her advance directives would not matter during her surgery.  Advised of risks and benefits to surgery and no surgery.  Pt states she would like the chance to continue to live and wants to have the surgery.  We discussed her MOST form and  wishes.  Patient relies on her daughter to make the decision and did not object.  Daughter was unable to discuss MOST with her brother and wants to wait to sign it until they have discussed it. There are some staff in the therapy department currently who have had Covid. Pt's daughter [name] wants to know who should give surgical clearance and advised her to follow up  with PCP and she has an appointment next week. History obtained from review of EMR, discussion with daughter, facility staff and/or Ms. Sing.     I reviewed EMR for available labs, medications, imaging, studies and related documents.  No new records since last visit/.   ROS General: NAD EYES: denies vision changes ENMT: denies dysphagia Cardiovascular: denies chest pain, denies DOE Pulmonary: denies cough, denies increased SOB Abdomen: endorses good appetite, denies constipation, endorses continence of bowel GU: denies dysuria, endorses continence of urine MSK:  denies increased weakness, no falls reported Skin: denies rashes or wounds Neurological: denies pain, denies insomnia Psych: Endorses euthymic mood Heme/lymph/immuno: denies bruises, abnormal bleeding  Physical Exam: Current and past weights: weight 152 lb 6.4 ounces at facility, on 04/24/21 weight was 156 lbs 11.2 ounces Constitutional: NAD General: frail appearing, WD CV: S1S2, RRR,  no edema Pulmonary: CTAB, no increased work of breathing, no cough, room air Abdomen: normo-active BS + 4 quadrants, soft and non tender MSK: no sarcopenia, moves all extremities, uses WC for mobility Neuro:  no generalized weakness, noted mild cognitive impairment Psych: non-anxious affect, A and O x 3 Hem/lymph/immuno: no widespread bruising  CURRENT PROBLEM LIST:  Patient Active Problem List   Diagnosis Date Noted   Primary oral squamous cell carcinoma ([place]) 09/17/2021   Palliative care encounter 69/79/4801   Diastolic dysfunction without heart failure 10/07/2020   Generalized weakness 07/10/2020   Fall at home, initial encounter 07/10/2020   COVID-19 virus infection 07/10/2020   Lower extremity edema 07/10/2020   Elevated troponin level not due myocardial infarction 07/10/2020   Hypertension    Anemia    Acute cystitis 07/09/2020   Stage 3 chronic kidney disease ([place]) 04/22/2020   Prediabetes 04/22/2020   Personal history  of transient ischemic attack (TIA), and cerebral infarction without residual deficits 04/22/2020   Osteoarthritis 04/22/2020   Mixed hyperlipidemia 04/22/2020   Malnutrition ([place]) 04/22/2020   Major depression, single episode 04/22/2020   Edema 04/22/2020   Disorder of arteries and arterioles, unspecified ([place]) 04/22/2020   Dementia ([place]) 04/22/2020   Genetic testing 11/13/2018   Malignant neoplasm of central portion of left breast in female, estrogen receptor positive ([place]) 10/26/2018   Osteopenia of lower leg 10/26/2018   Neck pain 05/05/2015   History of right common carotid artery stent placement 05/27/2012   PAST MEDICAL HISTORY:  Active Ambulatory Problems    Diagnosis Date Noted   History of right common carotid artery stent placement 05/27/2012   Malignant neoplasm of central portion of left breast in female, estrogen receptor positive ([place]) 10/26/2018   Osteopenia of lower leg 10/26/2018   Genetic testing 11/13/2018   Neck pain 05/05/2015   Stage 3 chronic kidney disease ([place]) 04/22/2020   Prediabetes 04/22/2020   Personal history of transient ischemic attack (TIA), and cerebral infarction without residual deficits 04/22/2020   Osteoarthritis 04/22/2020   Mixed hyperlipidemia 04/22/2020   Malnutrition ([place]) 04/22/2020   Major depression, single episode 04/22/2020   Edema 04/22/2020   Disorder of arteries and arterioles, unspecified ([place]) 04/22/2020   Dementia ([place]) 04/22/2020   Acute  cystitis 07/09/2020   Generalized weakness 07/10/2020   Fall at home, initial encounter 07/10/2020   COVID-19 virus infection 07/10/2020   Lower extremity edema 07/10/2020   Elevated troponin level not due myocardial infarction 07/10/2020   Hypertension    Anemia    Diastolic dysfunction without heart failure 10/07/2020   Primary oral squamous cell carcinoma ([place]) 09/17/2021   Palliative care encounter 09/17/2021   Resolved Ambulatory Problems    Diagnosis Date Noted   No Resolved  Ambulatory Problems   Past Medical History:  Diagnosis Date   Arthritis 04/13/2009   Carotid artery occlusion 07/15/2003   Depression    Depression    GERD (gastroesophageal reflux disease)    Hyperlipidemia    Stroke ([place]) 07/15/2003   Thoracic kyphosis    SOCIAL HX:  Social History   Tobacco Use   Smoking status: Never   Smokeless tobacco: Former    Types: Snuff  Substance Use Topics   Alcohol use: No    Alcohol/week: 0.0 standard drinks of alcohol   FAMILY HX:  Family History  Problem Relation Age of Onset   Heart disease Brother    Coronary artery disease Brother    Heart disease Brother    Heart disease Brother    Ovarian cancer Daughter 43       Died at 71   Breast cancer Neg Hx        Preferred Pharmacy: [facility]:  Allergies  Allergen Reactions   Fluogen [Influenza Virus Vaccine]     Other reaction(s): didn't tolerate the high dose shot   Penicillins    Sulfa Antibiotics    Codeine Other (See Comments)   Penicillin G Rash   Prednisone Palpitations   Sulfamethoxazole-Trimethoprim Rash     PERTINENT MEDICATIONS:  Outpatient Encounter Medications as of 09/18/2021  Medication Sig   acetaminophen (TYLENOL) 325 MG tablet Take 650 mg by mouth 2 (two) times daily.   anastrozole (ARIMIDEX) 1 MG tablet TAKE 1 TABLET(1 MG) BY MOUTH DAILY   aspirin 81 MG tablet Take 81 mg by mouth daily.   clonazePAM (KLONOPIN) 0.5 MG tablet Take 0.5 mg by mouth daily as needed for anxiety.   diclofenac Sodium (VOLTAREN) 1 % GEL Apply 2-4 g topically as directed. 4 gm QID to right knee, 2 gm BID to left wrist   furosemide (LASIX) 40 MG tablet Take 40 mg by mouth daily.   metoprolol tartrate (LOPRESSOR) 25 MG tablet Take 25 mg by mouth daily.    Multiple Vitamins-Minerals (PRESERVISION AREDS PO) Take 1 capsule by mouth 2 (two) times daily.   pantoprazole (PROTONIX) 40 MG tablet Take 1 tablet (40 mg total) by mouth daily at 6 (six) AM.   PARoxetine (PAXIL) 30 MG tablet Take 30 mg  by mouth daily.    Probiotic Product (PROBIOTIC-10 PO) Take 1 tablet by mouth 2 (two) times daily.   sodium chloride (SALINE MIST) 0.65 % nasal spray Place 1 spray into the nose daily.   Soft Lens Products (SM SALINE SOLUTION) SOLN Take 10 mLs by mouth 3 (three) times daily after meals. Rinse out mouth   No facility-administered encounter medications on file as of 09/18/2021.     ---------------------------------------------------------------------------------------- Advance Care Planning/Goals of Care: Goals include to maximize quality of life and symptom management. Health care surrogate gave her permission to discuss.Our advance care planning conversation included a discussion about:    The value and importance of advance care planning -has advance directive on file 07/03/2007 Identification  of a  healthcare agent-daughter, Rob Hickman Review and updating or creation of an advance directive document-DNR. Discussed MOST with pt's daughter and she wants to consult with pt's son as well before completion Decision not to resuscitate or to de-escalate disease focused treatments due to poor prognosis. CODE STATUS: DNR  Expressed wanting DNI and possibly choosing between limited or comfort care.  Wants IV fluids and antibiotics on case by case basis and no feeding tube but will confirm with pt's son and let provider know when she is ready to sign.     Thank you for the opportunity to participate in the care of Ms. Kleeman.  The palliative care team will continue to follow. Please call our office at [phone] if we can be of additional assistance.   Marijo Conception, FNP-C  COVID-19 PATIENT SCREENING TOOL Asked and negative response unless otherwise noted:  Have you had symptoms of covid, tested positive or been in contact with someone with symptoms/positive test in the past 5-10 days? Unknown, cases in her facility

## 2021-09-27 DIAGNOSIS — U071 COVID-19: Secondary | ICD-10-CM | POA: Diagnosis not present

## 2021-09-27 DIAGNOSIS — I10 Essential (primary) hypertension: Secondary | ICD-10-CM | POA: Diagnosis not present

## 2021-09-27 DIAGNOSIS — M79605 Pain in left leg: Secondary | ICD-10-CM | POA: Diagnosis not present

## 2021-09-27 DIAGNOSIS — M1990 Unspecified osteoarthritis, unspecified site: Secondary | ICD-10-CM | POA: Diagnosis not present

## 2021-10-03 ENCOUNTER — Ambulatory Visit
Admission: RE | Admit: 2021-10-03 | Discharge: 2021-10-03 | Disposition: A | Discharge status: Home / Self Care | Payer: PPO | Source: Ambulatory Visit | Attending: Hematology and Oncology | Admitting: Hematology and Oncology

## 2021-10-03 DIAGNOSIS — Z853 Personal history of malignant neoplasm of breast: Secondary | ICD-10-CM | POA: Diagnosis not present

## 2021-10-03 DIAGNOSIS — Z170 Estrogen receptor positive status [ER+]: Secondary | ICD-10-CM

## 2021-10-03 DIAGNOSIS — R928 Other abnormal and inconclusive findings on diagnostic imaging of breast: Secondary | ICD-10-CM | POA: Diagnosis not present

## 2021-10-04 ENCOUNTER — Telehealth: Payer: Self-pay | Admitting: Family Medicine

## 2021-10-04 DIAGNOSIS — M6281 Muscle weakness (generalized): Secondary | ICD-10-CM | POA: Diagnosis not present

## 2021-10-04 NOTE — Telephone Encounter (Signed)
Received voice mail yesterday from [facility], pt's daughter and [facility] POA.  She stated her mother had decided against the surgical procedure to remove the oral SCC.  TCT pt's daughter to discuss potential referral to Hospice. She states that her mother did not want to have the surgery to remove the oral mass and was told by ENT that it will continue to grow sufficiently until she will have trouble swallowing/breathing.  Advised daughter that she would be a candidate for hospice care at the facility to help with symptom management.  She plans to complete the MOST form and was informed that she could do this on the admission to Hospice as well.  She states feeling like she let her mother down but wanting to respect her wishes as she is of sound mind.  She lost her father in his 8s to heart disease, her sister in her 26s to ovarian cancer.  She does have a brother and her mother who is the patient.  Advised about Hospice support at the facility for her mother and her as well as grief support after her mother's passing.  Pt is on pureed foods and does not like it or want to eat it.  Advised that going outside of the "safe" diet could put her at risk for aspiration pneumonia and death but if her goals are comfort care, she may be able to liberalize the diet to allow pt what she wants.  Reinforced that she is doing all she can do to care for her mother.  She is in agreement with comfort care type goals, does not want a feeding tube or intubation, hospitalization.  She states that she may want to take pt to [facility] for the last 6 weeks of her life as this is her understanding from talking with someone at [facility] yesterday.  Advised Libby that Hospice can manage symptoms at [facility] to keep her comfortable, that [facility] is reserved for acute symptoms unmanageable in their current environment with a prognosis of 2 weeks or less. She verbalized understanding.  Advised that I would contact PCP for  referral orders and take care of referral to [facility] who would contact her to set up an admission visit.  Left vm for Irene Limbo, NP for [facility] who is pt's PCP to obtain referral orders for Hospice and alerting her to pt's decision to decline treatment of the oral cancer mass.  Left number for call back. Will send referral to [facility] hospice referral team.  Damaris Hippo FNP-C

## 2021-10-08 DIAGNOSIS — D649 Anemia, unspecified: Secondary | ICD-10-CM | POA: Diagnosis not present

## 2021-10-08 DIAGNOSIS — I10 Essential (primary) hypertension: Secondary | ICD-10-CM | POA: Diagnosis not present

## 2021-10-08 DIAGNOSIS — M1990 Unspecified osteoarthritis, unspecified site: Secondary | ICD-10-CM | POA: Diagnosis not present

## 2021-10-10 ENCOUNTER — Telehealth: Payer: Self-pay

## 2021-10-10 ENCOUNTER — Telehealth: Payer: Self-pay | Admitting: *Deleted

## 2021-10-10 DIAGNOSIS — K219 Gastro-esophageal reflux disease without esophagitis: Secondary | ICD-10-CM | POA: Diagnosis not present

## 2021-10-10 DIAGNOSIS — C059 Malignant neoplasm of palate, unspecified: Secondary | ICD-10-CM | POA: Diagnosis not present

## 2021-10-10 DIAGNOSIS — F419 Anxiety disorder, unspecified: Secondary | ICD-10-CM | POA: Diagnosis not present

## 2021-10-10 DIAGNOSIS — I5189 Other ill-defined heart diseases: Secondary | ICD-10-CM | POA: Diagnosis not present

## 2021-10-10 DIAGNOSIS — D649 Anemia, unspecified: Secondary | ICD-10-CM | POA: Diagnosis not present

## 2021-10-10 DIAGNOSIS — I10 Essential (primary) hypertension: Secondary | ICD-10-CM | POA: Diagnosis not present

## 2021-10-10 DIAGNOSIS — F32A Depression, unspecified: Secondary | ICD-10-CM | POA: Diagnosis not present

## 2021-10-10 DIAGNOSIS — M1990 Unspecified osteoarthritis, unspecified site: Secondary | ICD-10-CM | POA: Diagnosis not present

## 2021-10-10 NOTE — Telephone Encounter (Signed)
1245 pm.   Return call made to daughter [name].  She states patient is telling her someone from [facility] did a visit with her this morning. No visits are listed for this patient  today.  Followed up with Damaris Hippo, NP who advised she did not see the patient.  Referred daughter back to facility as this may possibly be a facility provider.

## 2021-10-12 DIAGNOSIS — M25561 Pain in right knee: Secondary | ICD-10-CM | POA: Diagnosis not present

## 2021-10-12 DIAGNOSIS — F32A Depression, unspecified: Secondary | ICD-10-CM | POA: Diagnosis not present

## 2021-10-12 DIAGNOSIS — K219 Gastro-esophageal reflux disease without esophagitis: Secondary | ICD-10-CM | POA: Diagnosis not present

## 2021-10-12 DIAGNOSIS — I10 Essential (primary) hypertension: Secondary | ICD-10-CM | POA: Diagnosis not present

## 2021-10-12 DIAGNOSIS — D649 Anemia, unspecified: Secondary | ICD-10-CM | POA: Diagnosis not present

## 2021-10-12 DIAGNOSIS — I5189 Other ill-defined heart diseases: Secondary | ICD-10-CM | POA: Diagnosis not present

## 2021-10-12 DIAGNOSIS — C059 Malignant neoplasm of palate, unspecified: Secondary | ICD-10-CM | POA: Diagnosis not present

## 2021-10-12 DIAGNOSIS — F419 Anxiety disorder, unspecified: Secondary | ICD-10-CM | POA: Diagnosis not present

## 2021-10-12 DIAGNOSIS — M1990 Unspecified osteoarthritis, unspecified site: Secondary | ICD-10-CM | POA: Diagnosis not present

## 2021-10-15 DIAGNOSIS — R1311 Dysphagia, oral phase: Secondary | ICD-10-CM | POA: Diagnosis not present

## 2021-10-16 ENCOUNTER — Encounter: Payer: Self-pay | Admitting: Family Medicine

## 2021-10-16 ENCOUNTER — Non-Acute Institutional Stay: Payer: PPO | Admitting: Family Medicine

## 2021-10-16 VITALS — BP 102/60 | HR 76 | Resp 18

## 2021-10-16 DIAGNOSIS — C069 Malignant neoplasm of mouth, unspecified: Secondary | ICD-10-CM | POA: Diagnosis not present

## 2021-10-16 DIAGNOSIS — Z515 Encounter for palliative care: Secondary | ICD-10-CM

## 2021-10-16 DIAGNOSIS — F332 Major depressive disorder, recurrent severe without psychotic features: Secondary | ICD-10-CM

## 2021-10-16 NOTE — Progress Notes (Signed)
[facility] Consult Note Telephone: [phone]  Fax: [phone]   Date of encounter: 10/16/21 12:34 PM PATIENT NAME: [name] [address] [place] [address]   [id number] (home)  DOB: 1924-09-12 MRN: [id number] PRIMARY CARE PROVIDER:    Verl Blalock, NP,  [address] [place] [address] [phone]  REFERRING PROVIDER:   Verl Blalock, NP [address] [place] ,   [address] [phone]  RESPONSIBLE PARTY:    Contact Information     Name Relation Home Work Ahmeek Daughter [phone]  [phone]        I met face to face with patient and her daughter Rob Hickman in her AL facility. Palliative Care was asked to follow this patient by consultation request of  Verl Blalock, NP to address advance care planning and complex medical decision making. This is a follow up visit.          ASSESSMENT, SYMPTOM MANAGEMENT AND PLAN / RECOMMENDATIONS:   Primary oral squamos cell carcinoma Declined treatment Recommend advancing diet to soft with pureed meats Referral to [facility]  2.   Recurrent major depression without psychosis Reviewed normal EOL behaviors/grieving with daughter-pt pulling away, declining intake, introspection, grief with anger/sadness/crying and that most often this occurs with the people pt is most comfortable with that won't leave. Recommend either change to different SSRI due to length of time with treatment or addition of Mirtazapine 7.5 mg QHS to improve sleep/mood and stimulate appetite along with personalized counseling (will check with Hospice SW to see if they can provide this for pt)  3.   Palliative Care Encounter MOST completed with daughter/ HCPOA Encouraged to try to arrange a time outside of the facility either on the grounds or in a restaurant that is neutral to give pt a new focus and encourage  son to visit.      Follow up Palliative Care Visit: Palliative care will defer to [facility] for continued follow up.    This visit was coded based on medical decision making (MDM).  PPS: 50%  HOSPICE ELIGIBILITY/DIAGNOSIS: TBD  Chief Complaint:  Palliative Care is following to assist primary caregiver with support to discuss treatment options for new oral cancer lesion and review advance directive planning and goals of care.  HISTORY OF PRESENT ILLNESS:  [name] is a 88 y.o. year old female with dementia with hx of ER positive breast cancer of left breast, HTN, osteopenia, OA, Stage 3 CKD, HLD, diastolic dysfunction without heart failure, right common carotid artery stent, hx of TIA, depression, weakness, anemia and malnutrition.  She has recently been diagnosed with oral Squamos cell cancer with no invasion of the bone or sinusesn. And has declined treatment.  Hospice referral previously received but pt was declined since she was still doing PT to help improve gait strength and ambulation. She denies pain, SOB, dysuria, nausea/vomiting and falls.  Pt has declined to do therapy the last couple of visits stating it is just too hard.  Daughter, [name] states pt has been crying frequently and saying things like she will never go out again, never have another picnic.  Daughter states that her brother has said he cannot come to visit his mother in her current living situation.  He has a disabled sister-in-law who is bedbound in his home he is providing care.  Encouraged daughter to consider an outing to a restaurant  with a nice outdoor seating and invite pt's son to join them to see if he will come and if the change in scenery and visit from son will also help pt.      History obtained from review of EMR, discussion with daughter, facility staff and/or Ms. Smoak.     I reviewed EMR for available labs, medications, imaging, studies and related documents.  No new records since  last visit/.   ROS obtained from pt and daughter General: NAD ENMT: denies dysphagia, hard of hearing Cardiovascular: denies chest pain, denies DOE Pulmonary: denies cough, denies increased SOB Abdomen: endorses fair appetite, denies constipation, endorses continence of bowel GU: denies dysuria, endorses continence of urine MSK:  denies increased weakness, no falls reported.  Pain in bilat knees worse in left Neurological: denies insomnia Psych: Daughter indicates pt sad, angry and crying a lot Heme/lymph/immuno: denies bruises, abnormal bleeding  Physical Exam: Current and past weights: weight 152 lb 6.4 ounces at facility, on 04/24/21 weight was 156 lbs 11.2 ounces Constitutional: NAD General: frail appearing, WD CV: S1S2, RRR,  no edema Pulmonary: CTAB, no increased work of breathing, no cough, room air Abdomen: normo-active BS + 4 quadrants, soft and non tender MSK: no sarcopenia, moves all extremities, uses WC for mobility, pain in bilateral knees due to OA Neuro:  no generalized weakness, noted mild cognitive impairment Psych: non-anxious affect, A and O x 3 Hem/lymph/immuno: no widespread bruising  CURRENT PROBLEM LIST:  Patient Active Problem List   Diagnosis Date Noted   Severe episode of recurrent major depressive disorder, without psychotic features ([place]) 10/16/2021   Primary oral squamous cell carcinoma ([place]) 09/17/2021   Palliative care encounter 27/78/2423   Diastolic dysfunction without heart failure 10/07/2020   Generalized weakness 07/10/2020   Fall at home, initial encounter 07/10/2020   COVID-19 virus infection 07/10/2020   Lower extremity edema 07/10/2020   Elevated troponin level not due myocardial infarction 07/10/2020   Hypertension    Anemia    Acute cystitis 07/09/2020   Stage 3 chronic kidney disease ([place]) 04/22/2020   Prediabetes 04/22/2020   Personal history of transient ischemic attack (TIA), and cerebral infarction without residual deficits  04/22/2020   Osteoarthritis 04/22/2020   Mixed hyperlipidemia 04/22/2020   Malnutrition ([place]) 04/22/2020   Major depression, single episode 04/22/2020   Edema 04/22/2020   Disorder of arteries and arterioles, unspecified ([place]) 04/22/2020   Dementia ([place]) 04/22/2020   Genetic testing 11/13/2018   Malignant neoplasm of central portion of left breast in female, estrogen receptor positive ([place]) 10/26/2018   Osteopenia of lower leg 10/26/2018   Neck pain 05/05/2015   History of right common carotid artery stent placement 05/27/2012   PAST MEDICAL HISTORY:  Active Ambulatory Problems    Diagnosis Date Noted   History of right common carotid artery stent placement 05/27/2012   Malignant neoplasm of central portion of left breast in female, estrogen receptor positive ([place]) 10/26/2018   Osteopenia of lower leg 10/26/2018   Genetic testing 11/13/2018   Neck pain 05/05/2015   Stage 3 chronic kidney disease ([place]) 04/22/2020   Prediabetes 04/22/2020   Personal history of transient ischemic attack (TIA), and cerebral infarction without residual deficits 04/22/2020   Osteoarthritis 04/22/2020   Mixed hyperlipidemia 04/22/2020   Malnutrition ([place]) 04/22/2020   Major depression, single episode 04/22/2020   Edema 04/22/2020   Disorder of arteries and arterioles, unspecified ([place]) 04/22/2020   Dementia ([place]) 04/22/2020   Acute cystitis 07/09/2020   Generalized weakness 07/10/2020  Fall at home, initial encounter 07/10/2020   COVID-19 virus infection 07/10/2020   Lower extremity edema 07/10/2020   Elevated troponin level not due myocardial infarction 07/10/2020   Hypertension    Anemia    Diastolic dysfunction without heart failure 10/07/2020   Primary oral squamous cell carcinoma ([place]) 09/17/2021   Palliative care encounter 09/17/2021   Severe episode of recurrent major depressive disorder, without psychotic features ([place]) 10/16/2021   Resolved Ambulatory Problems    Diagnosis Date Noted    No Resolved Ambulatory Problems   Past Medical History:  Diagnosis Date   Arthritis 04/13/2009   Carotid artery occlusion 07/15/2003   Depression    Depression    GERD (gastroesophageal reflux disease)    Hyperlipidemia    Stroke ([place]) 07/15/2003   Thoracic kyphosis    SOCIAL HX:  Social History   Tobacco Use   Smoking status: Never   Smokeless tobacco: Former    Types: Snuff  Substance Use Topics   Alcohol use: No    Alcohol/week: 0.0 standard drinks of alcohol   FAMILY HX:  Family History  Problem Relation Age of Onset   Heart disease Brother    Coronary artery disease Brother    Heart disease Brother    Heart disease Brother    Ovarian cancer Daughter 23       Died at 4   Breast cancer Neg Hx        Preferred Pharmacy: ALLERGIES:  Allergies  Allergen Reactions   Fluogen [Influenza Virus Vaccine]     Other reaction(s): didn't tolerate the high dose shot   Penicillins    Sulfa Antibiotics    Codeine Other (See Comments)   Penicillin G Rash   Prednisone Palpitations   Sulfamethoxazole-Trimethoprim Rash     PERTINENT MEDICATIONS:  Outpatient Encounter Medications as of 10/16/2021  Medication Sig   acetaminophen (TYLENOL) 325 MG tablet Take 650 mg by mouth 2 (two) times daily.   anastrozole (ARIMIDEX) 1 MG tablet TAKE 1 TABLET(1 MG) BY MOUTH DAILY   aspirin 81 MG tablet Take 81 mg by mouth daily.   clonazePAM (KLONOPIN) 0.5 MG tablet Take 0.5 mg by mouth daily as needed for anxiety.   diclofenac Sodium (VOLTAREN) 1 % GEL Apply 2-4 g topically as directed. 4 gm QID to right knee, 2 gm BID to left wrist   furosemide (LASIX) 40 MG tablet Take 40 mg by mouth daily.   metoprolol tartrate (LOPRESSOR) 25 MG tablet Take 25 mg by mouth daily.    Multiple Vitamins-Minerals (PRESERVISION AREDS PO) Take 1 capsule by mouth 2 (two) times daily.   pantoprazole (PROTONIX) 40 MG tablet Take 1 tablet (40 mg total) by mouth daily at 6 (six) AM.   PARoxetine (PAXIL) 30 MG  tablet Take 30 mg by mouth daily.    Probiotic Product (PROBIOTIC-10 PO) Take 1 tablet by mouth 2 (two) times daily.   sodium chloride (SALINE MIST) 0.65 % nasal spray Place 1 spray into the nose daily.   Soft Lens Products (SM SALINE SOLUTION) SOLN Take 10 mLs by mouth 3 (three) times daily after meals. Rinse out mouth   No facility-administered encounter medications on file as of 10/16/2021.     ---------------------------------------------------------------------------------------- Advance Care Planning/Goals of Care: Goals include to maximize quality of life and symptom management. Health care surrogate gave her permission to discuss.Our advance care planning conversation included a discussion about:    The value and importance of advance care planning -has advance directive on file  07/03/2007 Identification  of a healthcare agent-daughter, Stark Bray of an advance directive document-MOST Decision not to resuscitate or to de-escalate disease focused treatments due to poor prognosis. CODE STATUS: DNR MOST as of 10/16/2021: DNR/DNI with limited intervention Antibiotics and IV fluids on a case by case time limited basis No feeding tube     Thank you for the opportunity to participate in the care of Ms. Dambach.  The palliative care team will defer to the Hospice team to follow. Please call our office at [phone] if we can be of additional assistance.   Marijo Conception, FNP-C  COVID-19 PATIENT SCREENING TOOL Asked and negative response unless otherwise noted:  Have you had symptoms of covid, tested positive or been in contact with someone with symptoms/positive test in the past 5-10 days? no

## 2021-10-21 DIAGNOSIS — Z515 Encounter for palliative care: Secondary | ICD-10-CM | POA: Diagnosis not present

## 2021-10-25 DIAGNOSIS — F418 Other specified anxiety disorders: Secondary | ICD-10-CM | POA: Diagnosis not present

## 2021-10-25 DIAGNOSIS — C059 Malignant neoplasm of palate, unspecified: Secondary | ICD-10-CM | POA: Diagnosis not present

## 2021-10-25 DIAGNOSIS — I10 Essential (primary) hypertension: Secondary | ICD-10-CM | POA: Diagnosis not present

## 2021-10-30 ENCOUNTER — Telehealth: Payer: Self-pay | Admitting: *Deleted

## 2021-10-30 NOTE — Telephone Encounter (Signed)
This RN returned VM left by Rob Hickman- pt's dtr- stating " just want to update you on some medical concerns "  Obtained [name]'s identified VM - message to call again as well as this RN will call again tomorrow.

## 2021-11-01 ENCOUNTER — Telehealth: Payer: Self-pay | Admitting: *Deleted

## 2021-11-01 NOTE — Telephone Encounter (Signed)
This RN spoke with [name] ( pt's dtr,caregiver and POA).  She states

## 2021-11-07 DIAGNOSIS — D649 Anemia, unspecified: Secondary | ICD-10-CM | POA: Diagnosis not present

## 2021-11-07 DIAGNOSIS — I10 Essential (primary) hypertension: Secondary | ICD-10-CM | POA: Diagnosis not present

## 2021-11-07 DIAGNOSIS — M1990 Unspecified osteoarthritis, unspecified site: Secondary | ICD-10-CM | POA: Diagnosis not present

## 2021-11-08 ENCOUNTER — Ambulatory Visit: Payer: PPO

## 2021-11-08 ENCOUNTER — Encounter (HOSPITAL_COMMUNITY): Payer: PPO

## 2021-11-14 ENCOUNTER — Encounter: Payer: Self-pay | Admitting: Oncology

## 2021-11-15 DIAGNOSIS — I7091 Generalized atherosclerosis: Secondary | ICD-10-CM | POA: Diagnosis not present

## 2021-11-15 DIAGNOSIS — B351 Tinea unguium: Secondary | ICD-10-CM | POA: Diagnosis not present

## 2021-11-21 ENCOUNTER — Encounter: Payer: Self-pay | Admitting: Oncology

## 2021-11-21 NOTE — Telephone Encounter (Signed)
No entry 

## 2021-12-19 DIAGNOSIS — R059 Cough, unspecified: Secondary | ICD-10-CM | POA: Diagnosis not present

## 2022-06-26 ENCOUNTER — Telehealth: Payer: Self-pay | Admitting: Hematology and Oncology

## 2022-06-26 ENCOUNTER — Other Ambulatory Visit: Payer: Self-pay | Admitting: Adult Health

## 2022-06-26 NOTE — Telephone Encounter (Signed)
Called patient to get her scheduled. Patient daughter says she has to get appointment approved through hospice care. She will call back with appointment

## 2022-09-22 ENCOUNTER — Other Ambulatory Visit: Payer: Self-pay | Admitting: Hematology and Oncology

## 2022-09-23 ENCOUNTER — Telehealth: Payer: Self-pay | Admitting: Hematology and Oncology

## 2022-09-23 ENCOUNTER — Other Ambulatory Visit: Payer: Self-pay

## 2022-09-23 ENCOUNTER — Encounter: Payer: Self-pay | Admitting: Oncology

## 2022-09-23 NOTE — Telephone Encounter (Signed)
I had spoken with daughter this morning, she stated with her mother being under hospice care she wanted to wait until they had figured out the path for her care leading forward and then also what direction to move forward with providers, she also stated that with her being in hospice that she would have to discuss with her nurse timeframe's due to her scheduled,  she stated that once everything has been figured out that the daughter will call back and reschedule her appointment for her appointment for her

## 2022-10-21 ENCOUNTER — Other Ambulatory Visit: Payer: Self-pay | Admitting: Hematology and Oncology

## 2023-04-06 ENCOUNTER — Encounter: Payer: Self-pay | Admitting: Oncology

## 2023-04-06 ENCOUNTER — Inpatient Hospital Stay (HOSPITAL_COMMUNITY)
Admission: EM | Admit: 2023-04-06 | Discharge: 2023-04-13 | DRG: 872 | Disposition: A | Discharge status: Skilled Nursing Facility | Attending: Family Medicine | Admitting: Family Medicine

## 2023-04-06 ENCOUNTER — Emergency Department (HOSPITAL_COMMUNITY)

## 2023-04-06 DIAGNOSIS — I10 Essential (primary) hypertension: Secondary | ICD-10-CM | POA: Diagnosis present

## 2023-04-06 DIAGNOSIS — N179 Acute kidney failure, unspecified: Secondary | ICD-10-CM | POA: Diagnosis present

## 2023-04-06 DIAGNOSIS — K8071 Calculus of gallbladder and bile duct without cholecystitis with obstruction: Secondary | ICD-10-CM | POA: Diagnosis present

## 2023-04-06 DIAGNOSIS — M25561 Pain in right knee: Secondary | ICD-10-CM | POA: Diagnosis not present

## 2023-04-06 DIAGNOSIS — Z880 Allergy status to penicillin: Secondary | ICD-10-CM

## 2023-04-06 DIAGNOSIS — Z881 Allergy status to other antibiotic agents status: Secondary | ICD-10-CM | POA: Diagnosis not present

## 2023-04-06 DIAGNOSIS — E872 Acidosis, unspecified: Secondary | ICD-10-CM | POA: Diagnosis present

## 2023-04-06 DIAGNOSIS — K8309 Other cholangitis: Principal | ICD-10-CM

## 2023-04-06 DIAGNOSIS — Z993 Dependence on wheelchair: Secondary | ICD-10-CM | POA: Diagnosis not present

## 2023-04-06 DIAGNOSIS — Z8249 Family history of ischemic heart disease and other diseases of the circulatory system: Secondary | ICD-10-CM | POA: Diagnosis not present

## 2023-04-06 DIAGNOSIS — A419 Sepsis, unspecified organism: Principal | ICD-10-CM | POA: Diagnosis present

## 2023-04-06 DIAGNOSIS — Z8673 Personal history of transient ischemic attack (TIA), and cerebral infarction without residual deficits: Secondary | ICD-10-CM

## 2023-04-06 DIAGNOSIS — C069 Malignant neoplasm of mouth, unspecified: Secondary | ICD-10-CM | POA: Diagnosis present

## 2023-04-06 DIAGNOSIS — R197 Diarrhea, unspecified: Secondary | ICD-10-CM | POA: Diagnosis present

## 2023-04-06 DIAGNOSIS — F03C4 Unspecified dementia, severe, with anxiety: Secondary | ICD-10-CM | POA: Diagnosis present

## 2023-04-06 DIAGNOSIS — I6529 Occlusion and stenosis of unspecified carotid artery: Secondary | ICD-10-CM | POA: Diagnosis present

## 2023-04-06 DIAGNOSIS — Z66 Do not resuscitate: Secondary | ICD-10-CM | POA: Diagnosis present

## 2023-04-06 DIAGNOSIS — Z8616 Personal history of COVID-19: Secondary | ICD-10-CM | POA: Diagnosis not present

## 2023-04-06 DIAGNOSIS — B9620 Unspecified Escherichia coli [E. coli] as the cause of diseases classified elsewhere: Secondary | ICD-10-CM | POA: Diagnosis present

## 2023-04-06 DIAGNOSIS — Z79899 Other long term (current) drug therapy: Secondary | ICD-10-CM | POA: Diagnosis not present

## 2023-04-06 DIAGNOSIS — Z853 Personal history of malignant neoplasm of breast: Secondary | ICD-10-CM

## 2023-04-06 DIAGNOSIS — Z85819 Personal history of malignant neoplasm of unspecified site of lip, oral cavity, and pharynx: Secondary | ICD-10-CM | POA: Diagnosis not present

## 2023-04-06 DIAGNOSIS — F0390 Unspecified dementia without behavioral disturbance: Secondary | ICD-10-CM

## 2023-04-06 DIAGNOSIS — A4151 Sepsis due to Escherichia coli [E. coli]: Secondary | ICD-10-CM | POA: Diagnosis present

## 2023-04-06 DIAGNOSIS — E785 Hyperlipidemia, unspecified: Secondary | ICD-10-CM | POA: Diagnosis present

## 2023-04-06 DIAGNOSIS — Z515 Encounter for palliative care: Secondary | ICD-10-CM

## 2023-04-06 DIAGNOSIS — R17 Unspecified jaundice: Secondary | ICD-10-CM | POA: Diagnosis not present

## 2023-04-06 DIAGNOSIS — E871 Hypo-osmolality and hyponatremia: Secondary | ICD-10-CM | POA: Diagnosis present

## 2023-04-06 DIAGNOSIS — Z9841 Cataract extraction status, right eye: Secondary | ICD-10-CM

## 2023-04-06 DIAGNOSIS — G8929 Other chronic pain: Secondary | ICD-10-CM | POA: Diagnosis present

## 2023-04-06 DIAGNOSIS — Z882 Allergy status to sulfonamides status: Secondary | ICD-10-CM

## 2023-04-06 DIAGNOSIS — F03C3 Unspecified dementia, severe, with mood disturbance: Secondary | ICD-10-CM | POA: Diagnosis present

## 2023-04-06 DIAGNOSIS — Z9842 Cataract extraction status, left eye: Secondary | ICD-10-CM

## 2023-04-06 DIAGNOSIS — M1711 Unilateral primary osteoarthritis, right knee: Secondary | ICD-10-CM | POA: Diagnosis present

## 2023-04-06 DIAGNOSIS — Z961 Presence of intraocular lens: Secondary | ICD-10-CM | POA: Diagnosis present

## 2023-04-06 DIAGNOSIS — B9689 Other specified bacterial agents as the cause of diseases classified elsewhere: Secondary | ICD-10-CM | POA: Diagnosis present

## 2023-04-06 DIAGNOSIS — Z7982 Long term (current) use of aspirin: Secondary | ICD-10-CM | POA: Diagnosis not present

## 2023-04-06 DIAGNOSIS — R7881 Bacteremia: Secondary | ICD-10-CM | POA: Diagnosis not present

## 2023-04-06 DIAGNOSIS — I509 Heart failure, unspecified: Secondary | ICD-10-CM | POA: Diagnosis not present

## 2023-04-06 DIAGNOSIS — Z885 Allergy status to narcotic agent status: Secondary | ICD-10-CM

## 2023-04-06 DIAGNOSIS — F32A Depression, unspecified: Secondary | ICD-10-CM | POA: Diagnosis present

## 2023-04-06 DIAGNOSIS — Z887 Allergy status to serum and vaccine status: Secondary | ICD-10-CM

## 2023-04-06 DIAGNOSIS — K828 Other specified diseases of gallbladder: Secondary | ICD-10-CM | POA: Diagnosis present

## 2023-04-06 LAB — COMPREHENSIVE METABOLIC PANEL
ALT: 291 U/L — ABNORMAL HIGH (ref 0–44)
AST: 918 U/L — ABNORMAL HIGH (ref 15–41)
Albumin: 3.3 g/dL — ABNORMAL LOW (ref 3.5–5.0)
Alkaline Phosphatase: 454 U/L — ABNORMAL HIGH (ref 38–126)
Anion gap: 14 (ref 5–15)
BUN: 25 mg/dL — ABNORMAL HIGH (ref 8–23)
CO2: 19 mmol/L — ABNORMAL LOW (ref 22–32)
Calcium: 9.6 mg/dL (ref 8.9–10.3)
Chloride: 99 mmol/L (ref 98–111)
Creatinine, Ser: 1.68 mg/dL — ABNORMAL HIGH (ref 0.44–1.00)
GFR, Estimated: 28 mL/min — ABNORMAL LOW (ref >60)
Glucose, Bld: 130 mg/dL — ABNORMAL HIGH (ref 70–99)
Potassium: 4.3 mmol/L (ref 3.5–5.1)
Sodium: 132 mmol/L — ABNORMAL LOW (ref 135–145)
Total Bilirubin: 1.7 mg/dL — ABNORMAL HIGH (ref 0.0–1.2)
Total Protein: 8.4 g/dL — ABNORMAL HIGH (ref 6.5–8.1)

## 2023-04-06 LAB — CBC WITH DIFFERENTIAL/PLATELET
Abs Immature Granulocytes: 0.1 10*3/uL — ABNORMAL HIGH (ref 0.00–0.07)
Basophils Absolute: 0 10*3/uL (ref 0.0–0.1)
Basophils Relative: 0 %
Eosinophils Absolute: 0 10*3/uL (ref 0.0–0.5)
Eosinophils Relative: 0 %
HCT: 34.9 % — ABNORMAL LOW (ref 36.0–46.0)
Hemoglobin: 11.1 g/dL — ABNORMAL LOW (ref 12.0–15.0)
Immature Granulocytes: 1 %
Lymphocytes Relative: 3 %
Lymphs Abs: 0.6 10*3/uL — ABNORMAL LOW (ref 0.7–4.0)
MCH: 33 pg (ref 26.0–34.0)
MCHC: 31.8 g/dL (ref 30.0–36.0)
MCV: 103.9 fL — ABNORMAL HIGH (ref 80.0–100.0)
Monocytes Absolute: 0.4 10*3/uL (ref 0.1–1.0)
Monocytes Relative: 2 %
Neutro Abs: 17.3 10*3/uL — ABNORMAL HIGH (ref 1.7–7.7)
Neutrophils Relative %: 94 %
Platelets: 354 10*3/uL (ref 150–400)
RBC: 3.36 MIL/uL — ABNORMAL LOW (ref 3.87–5.11)
RDW: 11.5 % (ref 11.5–15.5)
WBC: 18.5 10*3/uL — ABNORMAL HIGH (ref 4.0–10.5)
nRBC: 0 % (ref 0.0–0.2)

## 2023-04-06 LAB — I-STAT CG4 LACTIC ACID, ED
Lactic Acid, Venous: 4.5 mmol/L (ref 0.5–1.9)
Lactic Acid, Venous: 3.9 mmol/L (ref 0.5–1.9)

## 2023-04-06 LAB — RESP PANEL BY RT-PCR (RSV, FLU A&B, COVID)  RVPGX2
Influenza A by PCR: NEGATIVE
Influenza B by PCR: NEGATIVE
Resp Syncytial Virus by PCR: NEGATIVE
SARS Coronavirus 2 by RT PCR: NEGATIVE

## 2023-04-06 LAB — HEPATITIS PANEL, ACUTE
HCV Ab: NONREACTIVE
Hep A IgM: NONREACTIVE
Hep B C IgM: NONREACTIVE
Hepatitis B Surface Ag: NONREACTIVE

## 2023-04-06 MED ORDER — SODIUM CHLORIDE 0.9 % IV SOLN: 2.0000 g | INTRAVENOUS | Status: DC

## 2023-04-06 MED ORDER — ACETAMINOPHEN 650 MG RE SUPP
RECTAL | Status: AC
  Filled 2023-04-06: qty 1

## 2023-04-06 MED ORDER — LACTATED RINGERS IV SOLN
INTRAVENOUS | Status: AC
  Administered 2023-04-07: 100 mL/h via INTRAVENOUS

## 2023-04-06 MED ORDER — METRONIDAZOLE 500 MG/100ML IV SOLN
500.0000 mg | Freq: Two times a day (BID) | INTRAVENOUS | Status: DC
  Administered 2023-04-06: 500 mg via INTRAVENOUS
  Filled 2023-04-06: qty 100

## 2023-04-06 MED ORDER — VANCOMYCIN HCL 1500 MG/300ML IV SOLN
1500.0000 mg | Freq: Once | INTRAVENOUS | Status: AC
  Administered 2023-04-06: 1500 mg via INTRAVENOUS
  Filled 2023-04-06: qty 300

## 2023-04-06 MED ORDER — SODIUM CHLORIDE 0.9 % IV SOLN
2.0000 g | Freq: Once | INTRAVENOUS | Status: AC
  Administered 2023-04-06: 2 g via INTRAVENOUS
  Filled 2023-04-06: qty 12.5

## 2023-04-06 MED ORDER — LACTATED RINGERS IV BOLUS
1000.0000 mL | Freq: Once | INTRAVENOUS | Status: AC
  Administered 2023-04-06: 1000 mL via INTRAVENOUS

## 2023-04-06 MED ORDER — ACETAMINOPHEN 500 MG PO TABS
1000.0000 mg | ORAL_TABLET | Freq: Once | ORAL | Status: AC
  Administered 2023-04-06: 1000 mg via ORAL
  Filled 2023-04-06: qty 2

## 2023-04-06 NOTE — ED Notes (Signed)
 Lactic acid flagged results given to chloe y,rn by at

## 2023-04-06 NOTE — ED Provider Notes (Signed)
 [place] EMERGENCY DEPARTMENT AT [facility] Provider Note  CSN: [id number] Arrival date & time: 04/06/23 1640  Chief Complaint(s) Diarrhea  HPI [name] is a 89 y.o. female with PMH TIA, HTN, HLD, depression, oral squamous cell carcinoma, dementia who presents from her facility due to concern for diarrhea and perioral cyanosis.  Facility initially called out as the patient had discolored toes and lips but on arrival patient has some mild erythema at the toes but no perioral cyanosis.  She arrives tachycardic and febrile with a new oxygen requirement.  There is reportedly a GI illness starting around the nursing facility.  Additional history unable to be obtained due to patient's underlying dementia.   Past Medical History Past Medical History:  Diagnosis Date   Arthritis 04/13/2009   Right knee   Carotid artery occlusion 07/15/2003   Identified at the time of stroke   COVID-19 virus infection 07/09/2020   Admitted with weakness, found to have UTI and COVID-19.  Treated with 3 days of remdesivir. ->  Remains extremely debilitated   Depression    Depression    GERD (gastroesophageal reflux disease)    Hyperlipidemia    Hypertension    Stroke (HCC) 07/15/2003   Mini   Thoracic kyphosis    Patient Active Problem List   Diagnosis Date Noted   Severe episode of recurrent major depressive disorder, without psychotic features (HCC) 10/16/2021   Primary oral squamous cell carcinoma (HCC) 09/17/2021   Palliative care encounter 09/17/2021   Diastolic dysfunction without heart failure 10/07/2020   Generalized weakness 07/10/2020   Fall at home, initial encounter 07/10/2020   COVID-19 virus infection 07/10/2020   Lower extremity edema 07/10/2020   Elevated troponin level not due myocardial infarction 07/10/2020   Hypertension    Anemia    Acute cystitis 07/09/2020   Stage 3 chronic kidney disease (HCC) 04/22/2020   Prediabetes 04/22/2020   Personal history of  transient ischemic attack (TIA), and cerebral infarction without residual deficits 04/22/2020   Osteoarthritis 04/22/2020   Mixed hyperlipidemia 04/22/2020   Malnutrition (HCC) 04/22/2020   Major depression, single episode 04/22/2020   Edema 04/22/2020   Disorder of arteries and arterioles, unspecified (HCC) 04/22/2020   Dementia (HCC) 04/22/2020   Genetic testing 11/13/2018   Malignant neoplasm of central portion of left breast in female, estrogen receptor positive (HCC) 10/26/2018   Osteopenia of lower leg 10/26/2018   Neck pain 05/05/2015   History of right common carotid artery stent placement 05/27/2012   Home Medication(s) Prior to Admission medications   Medication Sig Start Date End Date Taking? Authorizing Provider  acetaminophen (TYLENOL) 325 MG tablet Take 650 mg by mouth 2 (two) times daily.    [provider]  anastrozole (ARIMIDEX) 1 MG tablet TAKE 1 TABLET(1 MG) BY MOUTH DAILY 10/21/22   Rachel Moulds, MD  aspirin 81 MG tablet Take 81 mg by mouth daily.    [provider]  clonazePAM (KLONOPIN) 0.5 MG tablet Take 0.5 mg by mouth daily as needed for anxiety.    [provider]  diclofenac Sodium (VOLTAREN) 1 % GEL Apply 2-4 g topically as directed. 4 gm QID to right knee, 2 gm BID to left wrist 08/25/20   [provider]  furosemide (LASIX) 40 MG tablet Take 40 mg by mouth daily. 09/28/20   [provider]  metoprolol tartrate (LOPRESSOR) 25 MG tablet Take 25 mg by mouth daily.  06/12/17   [provider]  Multiple Vitamins-Minerals (PRESERVISION  AREDS PO) Take 1 capsule by mouth 2 (two) times daily.    [provider]  pantoprazole (PROTONIX) 40 MG tablet Take 1 tablet (40 mg total) by mouth daily at 6 (six) AM. 07/21/20   Lanae Boast, MD  PARoxetine (PAXIL) 30 MG tablet Take 30 mg by mouth daily.     [provider]  Probiotic Product (PROBIOTIC-10 PO) Take 1 tablet by mouth 2 (two) times daily.    [provider]  sodium chloride (SALINE MIST) 0.65 % nasal spray Place 1 spray into the nose daily.    [provider]  Soft Lens Products (SM SALINE SOLUTION) SOLN Take 10 mLs by mouth 3 (three) times daily after meals. Rinse out mouth    [provider]                                                                                                                                    Past Surgical History Past Surgical History:  Procedure Laterality Date   CATARACT EXTRACTION W/ INTRAOCULAR LENS  IMPLANT, BILATERAL     TRANSTHORACIC ECHOCARDIOGRAM  07/10/2020   In setting of UTI and COVID-19 infection, mildly elevated <100 hs Troponin (Demand ischemia) - Normal EF 60 to 65%.  No R WMA mild LVH.  GRII DD.  Moderately elevated PAP.  Moderate LA dilation.  Mild to moderate MR.  Moderate TR.  (very) mild aortic stenosis.  (Mean gradient 8 mmHg).   Family History Family History  Problem Relation Age of Onset   Heart disease Brother    Coronary artery disease Brother    Heart disease Brother    Heart disease Brother    Ovarian cancer Daughter 23       Died at 50   Breast cancer Neg Hx     Social History Social History   Tobacco Use   Smoking status: Never   Smokeless tobacco: Former    Types: Snuff  Vaping Use   Vaping status: Never Used  Substance Use Topics   Alcohol use: No    Alcohol/week: 0.0 standard drinks of alcohol   Drug use: No   Allergies Fluogen [influenza virus vaccine], Penicillins, Sulfa antibiotics, Codeine, Penicillin g, Prednisone, and Sulfamethoxazole-trimethoprim  Review of Systems Review of Systems  Unable to perform ROS: Dementia    Physical Exam Vital Signs  I have reviewed the triage vital signs BP 115/74   Pulse (!) 122   Temp (!) 101.9 F (38.8 C) (Axillary)   Resp (!) 26   SpO2 100%   Physical Exam Vitals and nursing note reviewed.  Constitutional:      General: She is not in acute distress.    Appearance: She is  well-developed.  HENT:     Head: Normocephalic and atraumatic.  Eyes:     Conjunctiva/sclera: Conjunctivae normal.  Cardiovascular:     Rate and Rhythm: Regular rhythm. Tachycardia present.  Heart sounds: No murmur heard. Pulmonary:     Effort: Pulmonary effort is normal. No respiratory distress.     Breath sounds: Normal breath sounds.  Abdominal:     Palpations: Abdomen is soft.     Tenderness: There is no abdominal tenderness.  Musculoskeletal:        General: No swelling.     Cervical back: Neck supple.  Skin:    General: Skin is warm and dry.     Capillary Refill: Capillary refill takes less than 2 seconds.  Neurological:     Mental Status: She is alert.  Psychiatric:        Mood and Affect: Mood normal.     ED Results and Treatments Labs (all labs ordered are listed, but only abnormal results are displayed) Labs Reviewed  CBC WITH DIFFERENTIAL/PLATELET - Abnormal; Notable for the following components:      Result Value   WBC 18.5 (*)    RBC 3.36 (*)    Hemoglobin 11.1 (*)    HCT 34.9 (*)    MCV 103.9 (*)    Neutro Abs 17.3 (*)    Lymphs Abs 0.6 (*)    Abs Immature Granulocytes 0.10 (*)    All other components within normal limits  GASTROINTESTINAL PANEL BY PCR, STOOL (REPLACES STOOL CULTURE)  RESP PANEL BY RT-PCR (RSV, FLU A&B, COVID)  RVPGX2  CULTURE, BLOOD (ROUTINE X 2)  CULTURE, BLOOD (ROUTINE X 2)  COMPREHENSIVE METABOLIC PANEL  I-STAT CG4 LACTIC ACID, ED                                                                                                                          Radiology No results found.  Pertinent labs & imaging results that were available during my care of the patient were reviewed by me and considered in my medical decision making (see MDM for details).  Medications Ordered in ED Medications  acetaminophen (TYLENOL) tablet 1,000 mg (1,000 mg Oral Not Given 04/06/23 1723)  acetaminophen (TYLENOL) 650 MG suppository (  Given 04/06/23  1723)  lactated ringers bolus 1,000 mL (1,000 mLs Intravenous New Bag/Given 04/06/23 1728)                                                                                                                                     Procedures .Critical Care  Performed by: Glendora Score, MD Authorized by:  Rashidah Belleville, MD   Critical care provider statement:    Critical care time (minutes):  30   Critical care was necessary to treat or prevent imminent or life-threatening deterioration of the following conditions:  Sepsis   Critical care was time spent personally by me on the following activities:  Development of treatment plan with patient or surrogate, discussions with consultants, evaluation of patient's response to treatment, examination of patient, ordering and review of laboratory studies, ordering and review of radiographic studies, ordering and performing treatments and interventions, pulse oximetry, re-evaluation of patient's condition and review of old charts   (including critical care time)  Medical Decision Making / ED Course   This patient presents to the ED for concern of diarrhea, skin discoloration, this involves an extensive number of treatment options, and is a complaint that carries with it a high risk of complications and morbidity.  The differential diagnosis includes bacteremia, sepsis, pneumonia, UTI, viral illness, obstruction, biliary pathology  MDM: Patient seen emergency room for evaluation of skin discoloration and diarrhea.  Physical exam with some mild erythema of the toe on the right but no perioral cyanosis as described in the field.  However, patient is tachycardic and febrile on arrival.  Sepsis protocol initiated and laboratory evaluation with a leukocytosis to 18.5, hemoglobin 11.1, BUN 25, creatinine 1.68, significant transaminitis with AST 918, ALT 291, alk phos 454, total bili 1.7, blood cultures obtained, initial lactic acid 4.5.  COVID, flu, RSV negative.   Broad-spectrum antibiotics and fluid resuscitation begun.  Chest abdomen pelvis CT without acute pathology but given significant transaminitis, right upper quadrant ultrasound obtained.  This shows a dilated gallbladder with sludge and stones.  Spoke with general surgeon on-call who came to evaluate the patient at bedside and is recommending HIDA scan and medical admission.  Patient admitted.   Additional history obtained: -Additional history obtained from daughter -External records from outside source obtained and reviewed including: Chart review including previous notes, labs, imaging, consultation notes   Lab Tests: -I ordered, reviewed, and interpreted labs.   The pertinent results include:   Labs Reviewed  CBC WITH DIFFERENTIAL/PLATELET - Abnormal; Notable for the following components:      Result Value   WBC 18.5 (*)    RBC 3.36 (*)    Hemoglobin 11.1 (*)    HCT 34.9 (*)    MCV 103.9 (*)    Neutro Abs 17.3 (*)    Lymphs Abs 0.6 (*)    Abs Immature Granulocytes 0.10 (*)    All other components within normal limits  GASTROINTESTINAL PANEL BY PCR, STOOL (REPLACES STOOL CULTURE)  RESP PANEL BY RT-PCR (RSV, FLU A&B, COVID)  RVPGX2  CULTURE, BLOOD (ROUTINE X 2)  CULTURE, BLOOD (ROUTINE X 2)  COMPREHENSIVE METABOLIC PANEL  I-STAT CG4 LACTIC ACID, ED      EKG   EKG Interpretation Date/Time:  Sunday April 06 2023 16:59:10 EDT Ventricular Rate:  126 PR Interval:  135 QRS Duration:  108 QT Interval:  314 QTC Calculation: 455 R Axis:   -54  Text Interpretation: Sinus tachycardia Left ventricular hypertrophy Confirmed by Janeth Terry ([id number]) on 04/06/2023 7:14:44 PM         Imaging Studies ordered: I ordered imaging studies including CT chest abdomen pelvis, right quadrant ultrasound I independently visualized and interpreted imaging. I agree with the radiologist interpretation   Medicines ordered and prescription drug management: Meds ordered this encounter   Medications   acetaminophen (TYLENOL) tablet 1,000 mg   acetaminophen (  TYLENOL) 650 MG suppository    Threasa Beards, Chloe Y: cabinet override   lactated ringers bolus 1,000 mL    -I have reviewed the patients home medicines and have made adjustments as needed  Critical interventions Broad-spectrum antibiotics, fluids,  Consultations Obtained: I requested consultation with the general surgeon on-call,  and discussed lab and imaging findings as well as pertinent plan - they recommend: Medical admission, HIDA scan   Cardiac Monitoring: The patient was maintained on a cardiac monitor.  I personally viewed and interpreted the cardiac monitored which showed an underlying rhythm of: Sinus tachycardia  Social Determinants of Health:  Factors impacting patients care include: Lives in nursing facility   Reevaluation: After the interventions noted above, I reevaluated the patient and found that they have :improved  Co morbidities that complicate the patient evaluation  Past Medical History:  Diagnosis Date   Arthritis 04/13/2009   Right knee   Carotid artery occlusion 07/15/2003   Identified at the time of stroke   COVID-19 virus infection 07/09/2020   Admitted with weakness, found to have UTI and COVID-19.  Treated with 3 days of remdesivir. ->  Remains extremely debilitated   Depression    Depression    GERD (gastroesophageal reflux disease)    Hyperlipidemia    Hypertension    Stroke (HCC) 07/15/2003   Mini   Thoracic kyphosis       Dispostion: I considered admission for this patient, and patient require hospital admission for suspected bacteremia     Final Clinical Impression(s) / ED Diagnoses Final diagnoses:  None     @PCDICTATION @    Glendora Score, MD 04/07/23 1558

## 2023-04-06 NOTE — H&P (Incomplete)
 History and Physical    Patient: [name] WUJ:[id number] DOB: 1924/09/23 DOA: 04/06/2023 DOS: the patient was seen and examined on 04/06/2023 PCP: Ellan Lambert, NP  Patient coming from: SNF  Chief Complaint:  Chief Complaint  Patient presents with   Diarrhea   HPI: [name] is a 89 y.o. female with medical history significant of ***       Review of Systems: {ROS_Text:26778} Past Medical History:  Diagnosis Date   Arthritis 04/13/2009   Right knee   Carotid artery occlusion 07/15/2003   Identified at the time of stroke   COVID-19 virus infection 07/09/2020   Admitted with weakness, found to have UTI and COVID-19.  Treated with 3 days of remdesivir. ->  Remains extremely debilitated   Depression    Depression    GERD (gastroesophageal reflux disease)    Hyperlipidemia    Hypertension    Stroke (HCC) 07/15/2003   Mini   Thoracic kyphosis    Past Surgical History:  Procedure Laterality Date   CATARACT EXTRACTION W/ INTRAOCULAR LENS  IMPLANT, BILATERAL     TRANSTHORACIC ECHOCARDIOGRAM  07/10/2020   In setting of UTI and COVID-19 infection, mildly elevated <100 hs Troponin (Demand ischemia) - Normal EF 60 to 65%.  No R WMA mild LVH.  GRII DD.  Moderately elevated PAP.  Moderate LA dilation.  Mild to moderate MR.  Moderate TR.  (very) mild aortic stenosis.  (Mean gradient 8 mmHg).   Social History:  reports that she has never smoked. She has quit using smokeless tobacco.  Her smokeless tobacco use included snuff. She reports that she does not drink alcohol and does not use drugs.  Allergies  Allergen Reactions   Fluogen [Influenza Virus Vaccine]     Other reaction(s): didn't tolerate the high dose shot   Penicillins    Sulfa Antibiotics    Codeine Other (See Comments)   Penicillin G Rash   Prednisone Palpitations   Sulfamethoxazole-Trimethoprim Rash    Family History  Problem Relation Age of Onset   Heart disease Brother    Coronary artery  disease Brother    Heart disease Brother    Heart disease Brother    Ovarian cancer Daughter 59       Died at 62   Breast cancer Neg Hx     Prior to Admission medications   Medication Sig Start Date End Date Taking? Authorizing Provider  acetaminophen (TYLENOL) 325 MG tablet Take 650 mg by mouth 2 (two) times daily.    [provider]  anastrozole (ARIMIDEX) 1 MG tablet TAKE 1 TABLET(1 MG) BY MOUTH DAILY 10/21/22   Rachel Moulds, MD  aspirin 81 MG tablet Take 81 mg by mouth daily.    [provider]  clonazePAM (KLONOPIN) 0.5 MG tablet Take 0.5 mg by mouth daily as needed for anxiety.    [provider]  diclofenac Sodium (VOLTAREN) 1 % GEL Apply 2-4 g topically as directed. 4 gm QID to right knee, 2 gm BID to left wrist 08/25/20   [provider]  furosemide (LASIX) 40 MG tablet Take 40 mg by mouth daily. 09/28/20   [provider]  metoprolol tartrate (LOPRESSOR) 25 MG tablet Take 25 mg by mouth daily.  06/12/17   [provider]  Multiple Vitamins-Minerals (PRESERVISION AREDS PO) Take 1 capsule by mouth 2 (two) times daily.    [provider]  pantoprazole (PROTONIX) 40 MG tablet Take 1 tablet (40 mg total) by mouth daily  at 6 (six) AM. 07/21/20   Lanae Boast, MD  PARoxetine (PAXIL) 30 MG tablet Take 30 mg by mouth daily.     [provider]  Probiotic Product (PROBIOTIC-10 PO) Take 1 tablet by mouth 2 (two) times daily.    [provider]  sodium chloride (SALINE MIST) 0.65 % nasal spray Place 1 spray into the nose daily.    [provider]  Soft Lens Products (SM SALINE SOLUTION) SOLN Take 10 mLs by mouth 3 (three) times daily after meals. Rinse out mouth    [provider]    Physical Exam: Vitals:   04/06/23 1900 04/06/23 1904 04/06/23 1915 04/06/23 2155  BP: 124/69  122/69 92/65  Pulse: (!) 111  (!) 112   Resp: (!) 26  (!) 24 (!) 22  Temp:  99.9 F (37.7 C)    TempSrc:  Axillary     SpO2: 96%  96%   Weight:      Height:       *** Data Reviewed: {Tip this will not be part of the note when signed- Document your independent interpretation of telemetry tracing, EKG, lab, Radiology test or any other diagnostic tests. Add any new diagnostic test ordered today. (Optional):26781} Results for orders placed or performed during the hospital encounter of 04/06/23 (from the past 24 hours)  Comprehensive metabolic panel     Status: Abnormal   Collection Time: 04/06/23  5:15 PM  Result Value Ref Range   Sodium 132 (L) 135 - 145 mmol/L   Potassium 4.3 3.5 - 5.1 mmol/L   Chloride 99 98 - 111 mmol/L   CO2 19 (L) 22 - 32 mmol/L   Glucose, Bld 130 (H) 70 - 99 mg/dL   BUN 25 (H) 8 - 23 mg/dL   Creatinine, Ser 1.61 (H) 0.44 - 1.00 mg/dL   Calcium 9.6 8.9 - 09.6 mg/dL   Total Protein 8.4 (H) 6.5 - 8.1 g/dL   Albumin 3.3 (L) 3.5 - 5.0 g/dL   AST 045 (H) 15 - 41 U/L   ALT 291 (H) 0 - 44 U/L   Alkaline Phosphatase 454 (H) 38 - 126 U/L   Total Bilirubin 1.7 (H) 0.0 - 1.2 mg/dL   GFR, Estimated 28 (L) >60 mL/min   Anion gap 14 5 - 15  CBC with Differential     Status: Abnormal   Collection Time: 04/06/23  5:15 PM  Result Value Ref Range   WBC 18.5 (H) 4.0 - 10.5 K/uL   RBC 3.36 (L) 3.87 - 5.11 MIL/uL   Hemoglobin 11.1 (L) 12.0 - 15.0 g/dL   HCT 40.9 (L) 81.1 - 91.4 %   MCV 103.9 (H) 80.0 - 100.0 fL   MCH 33.0 26.0 - 34.0 pg   MCHC 31.8 30.0 - 36.0 g/dL   RDW 78.2 95.6 - 21.3 %   Platelets 354 150 - 400 K/uL   nRBC 0.0 0.0 - 0.2 %   Neutrophils Relative % 94 %   Neutro Abs 17.3 (H) 1.7 - 7.7 K/uL   Lymphocytes Relative 3 %   Lymphs Abs 0.6 (L) 0.7 - 4.0 K/uL   Monocytes Relative 2 %   Monocytes Absolute 0.4 0.1 - 1.0 K/uL   Eosinophils Relative 0 %   Eosinophils Absolute 0.0 0.0 - 0.5 K/uL   Basophils Relative 0 %   Basophils Absolute 0.0 0.0 - 0.1 K/uL   Immature Granulocytes 1 %   Abs Immature Granulocytes 0.10 (H) 0.00 - 0.07 K/uL  I-Stat CG4 Lactic Acid     Status:  Abnormal   Collection Time: 04/06/23  5:23 PM  Result Value Ref Range   Lactic Acid, Venous 4.5 (HH) 0.5 - 1.9 mmol/L   Comment NOTIFIED PHYSICIAN   Resp panel by RT-PCR (RSV, Flu A&B, Covid) Anterior Nasal Swab     Status: None   Collection Time: 04/06/23  6:14 PM   Specimen: Anterior Nasal Swab  Result Value Ref Range   SARS Coronavirus 2 by RT PCR NEGATIVE NEGATIVE   Influenza A by PCR NEGATIVE NEGATIVE   Influenza B by PCR NEGATIVE NEGATIVE   Resp Syncytial Virus by PCR NEGATIVE NEGATIVE  Hepatitis panel, acute     Status: None   Collection Time: 04/06/23  6:35 PM  Result Value Ref Range   Hepatitis B Surface Ag NON REACTIVE NON REACTIVE   HCV Ab NON REACTIVE NON REACTIVE   Hep A IgM NON REACTIVE NON REACTIVE   Hep B C IgM NON REACTIVE NON REACTIVE  I-Stat CG4 Lactic Acid     Status: Abnormal   Collection Time: 04/06/23  6:44 PM  Result Value Ref Range   Lactic Acid, Venous 3.9 (HH) 0.5 - 1.9 mmol/L   Comment NOTIFIED PHYSICIAN      Assessment and Plan: Low garde fever, diarrhea, tachycardia - gastroenteritis vs gallbladder pathology -  - Hida scan ordered per surgeon's recommendation.  Noted that the surgeon does not think the gallbladder is the cause of the patient's symptoms. - Vanc and Cefepime started epirically.  This could be a viral gastroenteritis.   2.      Advance Care Planning:   Code Status: Prior ***  Consults: general surgery  Family Communication: daughter at bedside  Severity of Illness: The appropriate patient status for this patient is INPATIENT. Inpatient status is judged to be reasonable and necessary in order to provide the required intensity of service to ensure the patient's safety. The patient's presenting symptoms, physical exam findings, and initial radiographic and laboratory data in the context of their chronic comorbidities is felt to place them at high risk for further clinical deterioration. Furthermore, it is not anticipated that the  patient will be medically stable for discharge from the hospital within 2 midnights of admission.   * I certify that at the point of admission it is my clinical judgment that the patient will require inpatient hospital care spanning beyond 2 midnights from the point of admission due to high intensity of service, high risk for further deterioration and high frequency of surveillance required.*  Author: Buena Irish, MD 04/06/2023 10:31 PM  For on call review [email or web address].

## 2023-04-06 NOTE — Progress Notes (Signed)
 ED Pharmacy Antibiotic Sign Off An antibiotic consult was received from an ED provider for cefepime and vancomycin per pharmacy dosing for sepsis. A chart review was completed to assess appropriateness.   The following one time order(s) were placed:  Cefepime 2g  Vancomycin 1500mg    Further antibiotic and/or antibiotic pharmacy consults should be ordered by the admitting provider if indicated.   Thank you for allowing pharmacy to be a part of this patient's care.   Estill Batten, PharmD, BCCCP  Clinical Pharmacist 04/06/23 6:15 PM

## 2023-04-06 NOTE — Consult Note (Signed)
 Reason for Consult:  Elevated LFTs, gallbladder sludge and stones Referring Provider: Kommor, MD  HPI  [name] is an 89 y.o. female with history of TIA, HTN, HLD, depression, oral SCC, and dementia who lives in a facility who presents with diarrhea and perioral cyanosis.  Patient was brought to ED this evening for diarrhea and was tachycardic and febrile on arrival with new oxygen requirement. Per report, there has been gastroenteritis in several of the residents of the facility where she resides.  CT scan did not show any remarkable intraabdominal findings. RUQ [place] was obtained due to elevated LFTs (ALP 545, AST 918, ALT 291, Tbili 1.7). US showed gallbladder sludge and small mobile stones without wall thickening or pericholecystic fluid. Normal CBD diameter.  Other labs notable for initial lactate of 4.5, downtrending to 3.9 most recently. Leukocytosis to 18.5. Hepatitis workup negative thus far.  Patient denies any abdominal pain.   Patient's last echo in 2022 with EF 60-65%, normal LV and RV function but elevated PAP, MVR, TVR.   10 point review of systems is negative except as listed above in HPI.  Objective  Past Medical History: Past Medical History:  Diagnosis Date   Arthritis 04/13/2009   Right knee   Carotid artery occlusion 07/15/2003   Identified at the time of stroke   COVID-19 virus infection 07/09/2020   Admitted with weakness, found to have UTI and COVID-19.  Treated with 3 days of remdesivir. ->  Remains extremely debilitated   Depression    Depression    GERD (gastroesophageal reflux disease)    Hyperlipidemia    Hypertension    Stroke (HCC) 07/15/2003   Mini   Thoracic kyphosis     Past Surgical History: Past Surgical History:  Procedure Laterality Date   CATARACT EXTRACTION W/ INTRAOCULAR LENS  IMPLANT, BILATERAL     TRANSTHORACIC ECHOCARDIOGRAM  07/10/2020   In setting of UTI and COVID-19 infection, mildly elevated <100 hs Troponin (Demand  ischemia) - Normal EF 60 to 65%.  No R WMA mild LVH.  GRII DD.  Moderately elevated PAP.  Moderate LA dilation.  Mild to moderate MR.  Moderate TR.  (very) mild aortic stenosis.  (Mean gradient 8 mmHg).    Family History:  Family History  Problem Relation Age of Onset   Heart disease Brother    Coronary artery disease Brother    Heart disease Brother    Heart disease Brother    Ovarian cancer Daughter 15       Died at 15   Breast cancer Neg Hx     Social History:  reports that she has never smoked. She has quit using smokeless tobacco.  Her smokeless tobacco use included snuff. She reports that she does not drink alcohol and does not use drugs.  Allergies:  Allergies  Allergen Reactions   Fluogen [Influenza Virus Vaccine]     Other reaction(s): didn't tolerate the high dose shot   Penicillins    Sulfa Antibiotics    Codeine Other (See Comments)   Penicillin G Rash   Prednisone Palpitations   Sulfamethoxazole-Trimethoprim Rash    Medications: I have reviewed the patient's current medications.  Labs: I have personally reviewed all labs for the past 24h  Imaging: I have personally reviewed and interpreted all imaging for the past 24h and agree with the radiologist's impression.  US Abdomen Limited RUQ (LIVER/GB) Result Date: 04/06/2023 CLINICAL DATA:  Elevated LFTs EXAM: ULTRASOUND ABDOMEN LIMITED RIGHT UPPER QUADRANT COMPARISON:  CT from earlier in the same day. FINDINGS: Gallbladder: Gallbladder is well distended. Gallbladder sludge is noted within. A few small mobile stones are seen. No wall thickening or pericholecystic fluid is noted. Negative sonographic Murphy's sign is elicited. Common bile duct: Diameter: 6 mm. Liver: No focal lesion identified. Within normal limits in parenchymal echogenicity. Portal vein is patent on color Doppler imaging with normal direction of blood flow towards the liver. Other: None. IMPRESSION: Gallbladder sludge and small stones. Electronically  Signed   By: Alcide Clever M.D.   On: 04/06/2023 21:18   CT CHEST ABDOMEN PELVIS WO CONTRAST Result Date: 04/06/2023 CLINICAL DATA:  Peripheral cyanosis and diarrhea with abdominal pain EXAM: CT CHEST, ABDOMEN AND PELVIS WITHOUT CONTRAST TECHNIQUE: Multidetector CT imaging of the chest, abdomen and pelvis was performed following the standard protocol without IV contrast. RADIATION DOSE REDUCTION: This exam was performed according to the departmental dose-optimization program which includes automated exposure control, adjustment of the mA and/or kV according to patient size and/or use of iterative reconstruction technique. COMPARISON:  07/10/2020 FINDINGS: CT CHEST FINDINGS Cardiovascular: Limited due to the lack of IV contrast. Atherosclerotic calcifications are seen. No aneurysmal dilatation is noted. Extensive calcification of the mitral annulus is noted. No cardiac enlargement is seen. Heavy coronary calcifications are noted as well. Mediastinum/Nodes: Thoracic inlet is within normal limits. No hilar or mediastinal adenopathy is noted. The esophagus as visualized is within normal limits. Lungs/Pleura: Lungs are well aerated bilaterally. Basilar atelectatic changes are seen. A small 3 mm nodule is noted in the right middle lobe laterally not well appreciated on the prior exam. No further follow-up is recommended. No other significant nodule is noted. Musculoskeletal: Degenerative changes of the thoracic spine are noted. Chronic T5 compression fracture is noted. Old healed manubrial fracture is seen. No definitive rib abnormality is noted. CT ABDOMEN PELVIS FINDINGS Hepatobiliary: No focal liver abnormality is seen. No gallstones, gallbladder wall thickening, or biliary dilatation. Pancreas: Unremarkable. No pancreatic ductal dilatation or surrounding inflammatory changes. Spleen: Normal in size without focal abnormality. Adrenals/Urinary Tract: Adrenal glands are within normal limits. Kidneys show no renal  calculi or obstructive changes. A few small exophytic cysts are noted arising from the kidneys. No follow-up is recommended. The bladder is well distended. Stomach/Bowel: Scattered diverticular change of the colon is noted without evidence of diverticulitis. The appendix is air-filled and within normal limits. Small bowel and stomach are unremarkable. Vascular/Lymphatic: Aortic atherosclerosis. No enlarged abdominal or pelvic lymph nodes. Reproductive: Uterus and bilateral adnexa are unremarkable. Other: No abdominal wall hernia or abnormality. No abdominopelvic ascites. Musculoskeletal: Degenerative changes of the right hip joint are noted with remodeling of the femoral head. No acute fracture is noted. Chronic L2 compression fracture is noted. IMPRESSION: Somewhat limited exam due to lack of IV contrast. Single nodule in the right middle lobe. No follow-up is recommended based on the size and patient's given age. Diverticular change without diverticulitis. Chronic bony changes as described. Electronically Signed   By: Alcide Clever M.D.   On: 04/06/2023 19:51     Physical Exam Blood pressure 92/65, pulse (!) 112, temperature 99.9 F (37.7 C), temperature source Axillary, resp. rate (!) 22, height 5\' 6"  (1.676 m), weight 66.7 kg, SpO2 96%. Constitutional: elderly female HEENT: pupils equal, round, reactive to light. Head normocephalic, atraumatic Oropharynx: mucous membranes dry CV: Sinus tachycardia, normotensive Chest: equal chest rise bilaterally  Abdomen: soft, nondistended, nontender, negative Murphy's sign Skin: warm, dry, no rashes Neuro: Alert, baseline dementia  Assessment   RACHELL DRUCKENMILLER is an 89 y.o. female who presents with diarrhea and perioral cyanosis found to have elevated LFTs, gallbladder sludge and small mobile stones.  Plan  - Agree with medicine admission - Given patient has no abdominal pain, negative Murphy's sign and no imaging concerns for cholecystitis, I have  lower suspicion that gallbladder is source of symptoms.  - Recommend HIDA to r/o cholecystitis - Discussed with daughter at bedside that if HIDA positive will need to discuss possible intervention. Given patient's age, dementia and dependence in facility, daughter who is her POA would not want to pursue an operation. She has concerns about facility's ability to care for a cholecystostomy tube as well if patient were proven to have cholecystitis. - Defer to primary team if they feel she needs antibiotics for other source, but do not feel she needs antibiotics for cholecystitis at this time. - Defer to primary but could consider cardiac etiology contributing to elevated LFTs given history of valve regurg and elevated PAP.  - Repeat AM LFTs - NPO, IVF per primary  I reviewed ED provider notes, last 24 h vitals and pain scores, last 48 h intake and output, last 24 h labs and trends, and last 24 h imaging results. Discussed plan of care with patient's daughter and EDP, Dr. Posey Rea.  This care required moderate level of medical decision making.   Donata Duff, MD [facility]

## 2023-04-06 NOTE — ED Triage Notes (Signed)
 Pt BIB  [facility] from [facility] facility . EMS was initially dispatched out bleu toes and blue fingers and blue tongue  ? There is a stomach bug going around at the nursing facility. Pt had 1 episode of diarrhea today. Facility reported abd pain but denied by the pt. Pt is able to answer questions appropriately but has dementia at baseline. Pt does not wear o2 at baseline. VSS.   O2 91-95% on 3L  Bp 122/78 RR 18  Cbg 187  Temp 97.5

## 2023-04-06 NOTE — Progress Notes (Signed)
 Pharmacy Antibiotic Note  [name] is a 89 y.o. female admitted on 04/06/2023 with  intra-abdominal .  Pharmacy has been consulted for cefepime dosing.  Plan: Cefepie 2g q24h.  Flagyl 500 mg q12h per MD.  Follow culture data for de-escalation.  Monitor renal function for dose adjustments as indicated.   Height: 5\' 6"  (167.6 cm) Weight: 66.7 kg (147 lb) IBW/kg (Calculated) : 59.3  Temp (24hrs), Avg:101.2 F (38.4 C), Min:99.9 F (37.7 C), Max:101.9 F (38.8 C)  Recent Labs  Lab 04/06/23 1715 04/06/23 1723 04/06/23 1844  WBC 18.5*  --   --   CREATININE 1.68*  --   --   LATICACIDVEN  --  4.5* 3.9*    Estimated Creatinine Clearance: 18.3 mL/min (A) (by C-G formula based on SCr of 1.68 mg/dL (H)).    Allergies  Allergen Reactions   Fluogen [Influenza Virus Vaccine]     Other reaction(s): didn't tolerate the high dose shot   Penicillins    Sulfa Antibiotics    Codeine Other (See Comments)   Penicillin G Rash   Prednisone Palpitations   Sulfamethoxazole-Trimethoprim Rash    Antimicrobials this admission: Cefepime 3/23 >>  Vancomycin x1 on 3/23   Microbiology results: 3/23 BCx:  3/23 GI by PCR:   Thank you for allowing pharmacy to be a part of this patient's care.  Estill Batten, PharmD, BCCCP  04/06/2023 9:50 PM

## 2023-04-06 NOTE — Progress Notes (Addendum)
 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   Patient is a DNR and Leanne Lovely [phone] is the patient's primary CG.   We will continue to follow for any discharge planning needs and to coordinate continuation of?hospice care.?   Please don't hesitate to call with any Hospice related questions or concerns.    Thank you for the opportunity to participate in this patient's care  Roe Rutherford, BSN, [facility] Liaison [phone]

## 2023-04-07 ENCOUNTER — Encounter (HOSPITAL_COMMUNITY): Payer: Self-pay | Admitting: Internal Medicine

## 2023-04-07 ENCOUNTER — Other Ambulatory Visit: Payer: Self-pay

## 2023-04-07 ENCOUNTER — Inpatient Hospital Stay (HOSPITAL_COMMUNITY)

## 2023-04-07 DIAGNOSIS — I509 Heart failure, unspecified: Secondary | ICD-10-CM | POA: Diagnosis not present

## 2023-04-07 DIAGNOSIS — K8309 Other cholangitis: Secondary | ICD-10-CM | POA: Diagnosis not present

## 2023-04-07 LAB — BLOOD CULTURE ID PANEL (REFLEXED) - BCID2

## 2023-04-07 LAB — CBC
HCT: 27.2 % — ABNORMAL LOW (ref 36.0–46.0)
Hemoglobin: 8.8 g/dL — ABNORMAL LOW (ref 12.0–15.0)
MCH: 32.4 pg (ref 26.0–34.0)
MCHC: 32.4 g/dL (ref 30.0–36.0)
MCV: 100 fL (ref 80.0–100.0)
Platelets: 251 10*3/uL (ref 150–400)
RBC: 2.72 MIL/uL — ABNORMAL LOW (ref 3.87–5.11)
RDW: 11.8 % (ref 11.5–15.5)
WBC: 15.4 10*3/uL — ABNORMAL HIGH (ref 4.0–10.5)
nRBC: 0 % (ref 0.0–0.2)

## 2023-04-07 LAB — COMPREHENSIVE METABOLIC PANEL
ALT: 224 U/L — ABNORMAL HIGH (ref 0–44)
AST: 403 U/L — ABNORMAL HIGH (ref 15–41)
Albumin: 2.7 g/dL — ABNORMAL LOW (ref 3.5–5.0)
Alkaline Phosphatase: 346 U/L — ABNORMAL HIGH (ref 38–126)
Anion gap: 9 (ref 5–15)
BUN: 25 mg/dL — ABNORMAL HIGH (ref 8–23)
CO2: 20 mmol/L — ABNORMAL LOW (ref 22–32)
Calcium: 8.9 mg/dL (ref 8.9–10.3)
Chloride: 104 mmol/L (ref 98–111)
Creatinine, Ser: 1.41 mg/dL — ABNORMAL HIGH (ref 0.44–1.00)
GFR, Estimated: 34 mL/min — ABNORMAL LOW (ref >60)
Glucose, Bld: 95 mg/dL (ref 70–99)
Potassium: 3.7 mmol/L (ref 3.5–5.1)
Sodium: 133 mmol/L — ABNORMAL LOW (ref 135–145)
Total Bilirubin: 3.2 mg/dL — ABNORMAL HIGH (ref 0.0–1.2)
Total Protein: 6.6 g/dL (ref 6.5–8.1)

## 2023-04-07 LAB — ECHOCARDIOGRAM COMPLETE
Area-P 1/2: 4.3 cm2
Calc EF: 61.8 %
Height: 66 in
MV VTI: 2.47 cm2
S' Lateral: 3.1 cm
Single Plane A2C EF: 61.7 %
Single Plane A4C EF: 63.7 %
Weight: 2352 [oz_av]

## 2023-04-07 LAB — LACTIC ACID, PLASMA: Lactic Acid, Venous: 1 mmol/L (ref 0.5–1.9)

## 2023-04-07 MED ORDER — SODIUM CHLORIDE 0.9 % IV SOLN
2.0000 g | INTRAVENOUS | Status: DC
  Administered 2023-04-07 – 2023-04-08 (×2): 2 g via INTRAVENOUS
  Filled 2023-04-07 (×2): qty 20

## 2023-04-07 MED ORDER — ASPIRIN 81 MG PO TBEC
81.0000 mg | DELAYED_RELEASE_TABLET | Freq: Every day | ORAL | Status: DC
  Administered 2023-04-07 – 2023-04-13 (×7): 81 mg via ORAL
  Filled 2023-04-07 (×7): qty 1

## 2023-04-07 MED ORDER — PANTOPRAZOLE SODIUM 40 MG PO TBEC
40.0000 mg | DELAYED_RELEASE_TABLET | Freq: Every day | ORAL | Status: DC
Start: 2023-04-08 — End: 2023-04-13
  Administered 2023-04-09 – 2023-04-13 (×5): 40 mg via ORAL
  Filled 2023-04-07 (×5): qty 1

## 2023-04-07 MED ORDER — ONDANSETRON HCL 4 MG PO TABS: 4.0000 mg | ORAL_TABLET | Freq: Four times a day (QID) | ORAL | Status: DC | PRN

## 2023-04-07 MED ORDER — CLONAZEPAM 0.5 MG PO TABS: 0.5000 mg | ORAL_TABLET | Freq: Every day | ORAL | Status: DC

## 2023-04-07 MED ORDER — METOPROLOL TARTRATE 25 MG PO TABS
25.0000 mg | ORAL_TABLET | Freq: Every day | ORAL | Status: DC
  Administered 2023-04-07 – 2023-04-13 (×7): 25 mg via ORAL
  Filled 2023-04-07 (×7): qty 1

## 2023-04-07 MED ORDER — ACETAMINOPHEN 325 MG PO TABS: 650.0000 mg | ORAL_TABLET | Freq: Four times a day (QID) | ORAL | Status: DC | PRN

## 2023-04-07 MED ORDER — TRAMADOL HCL 50 MG PO TABS
50.0000 mg | ORAL_TABLET | Freq: Three times a day (TID) | ORAL | Status: DC | PRN
  Administered 2023-04-07 – 2023-04-09 (×3): 50 mg via ORAL
  Filled 2023-04-07 (×4): qty 1

## 2023-04-07 MED ORDER — DICLOFENAC SODIUM 1 % EX GEL
4.0000 g | Freq: Three times a day (TID) | CUTANEOUS | Status: DC
  Administered 2023-04-07 – 2023-04-12 (×20): 4 g via TOPICAL
  Filled 2023-04-07: qty 100

## 2023-04-07 MED ORDER — ACETAMINOPHEN 650 MG RE SUPP: 650.0000 mg | Freq: Four times a day (QID) | RECTAL | Status: DC | PRN

## 2023-04-07 MED ORDER — TECHNETIUM TC 99M MEBROFENIN IV KIT
5.0000 | PACK | Freq: Once | INTRAVENOUS | Status: AC | PRN
  Administered 2023-04-07: 5 via INTRAVENOUS

## 2023-04-07 MED ORDER — CLONAZEPAM 0.25 MG PO TBDP
0.2500 mg | ORAL_TABLET | Freq: Every day | ORAL | Status: DC
  Administered 2023-04-07 – 2023-04-12 (×5): 0.25 mg via ORAL
  Filled 2023-04-07 (×6): qty 1

## 2023-04-07 MED ORDER — ONDANSETRON HCL 4 MG/2ML IJ SOLN
4.0000 mg | Freq: Four times a day (QID) | INTRAMUSCULAR | Status: DC | PRN
  Filled 2023-04-07: qty 2

## 2023-04-07 NOTE — Progress Notes (Signed)
 PHARMACY - PHYSICIAN COMMUNICATION CRITICAL VALUE ALERT - BLOOD CULTURE IDENTIFICATION (BCID)  [name] is an 89 y.o. female who presented to [facility] on 04/06/2023 with a chief complaint of hypoxia/diarrhea.   Assessment:  89 year old female admitted with gastroenteritis/diarrhea. Now with E coli in 1 bottle of both sets of blood cultures. Likely GI translocation.   Name of physician (or Provider) Contacted: [place]   Current antibiotics: Cefepime/flagyl  Changes to prescribed antibiotics recommended:  Change antibiotics to ceftriaxone 2 gm IV q 24 hours   Results for orders placed or performed during the hospital encounter of 04/06/23  Blood Culture ID Panel (Reflexed) (Collected: 04/06/2023  5:00 PM)  Result Value Ref Range   Enterococcus faecalis NOT DETECTED NOT DETECTED   Enterococcus Faecium NOT DETECTED NOT DETECTED   Listeria monocytogenes NOT DETECTED NOT DETECTED   Staphylococcus species NOT DETECTED NOT DETECTED   Staphylococcus aureus (BCID) NOT DETECTED NOT DETECTED   Staphylococcus epidermidis NOT DETECTED NOT DETECTED   Staphylococcus lugdunensis NOT DETECTED NOT DETECTED   Streptococcus species NOT DETECTED NOT DETECTED   Streptococcus agalactiae NOT DETECTED NOT DETECTED   Streptococcus pneumoniae NOT DETECTED NOT DETECTED   Streptococcus pyogenes NOT DETECTED NOT DETECTED   A.calcoaceticus-baumannii NOT DETECTED NOT DETECTED   Bacteroides fragilis NOT DETECTED NOT DETECTED   Enterobacterales DETECTED (A) NOT DETECTED   Enterobacter cloacae complex NOT DETECTED NOT DETECTED   Escherichia coli DETECTED (A) NOT DETECTED   Klebsiella aerogenes NOT DETECTED NOT DETECTED   Klebsiella oxytoca NOT DETECTED NOT DETECTED   Klebsiella pneumoniae NOT DETECTED NOT DETECTED   Proteus species NOT DETECTED NOT DETECTED   Salmonella species NOT DETECTED NOT DETECTED   Serratia marcescens NOT DETECTED NOT DETECTED   Haemophilus influenzae NOT DETECTED NOT DETECTED    Neisseria meningitidis NOT DETECTED NOT DETECTED   Pseudomonas aeruginosa NOT DETECTED NOT DETECTED   Stenotrophomonas maltophilia NOT DETECTED NOT DETECTED   Candida albicans NOT DETECTED NOT DETECTED   Candida auris NOT DETECTED NOT DETECTED   Candida glabrata NOT DETECTED NOT DETECTED   Candida krusei NOT DETECTED NOT DETECTED   Candida parapsilosis NOT DETECTED NOT DETECTED   Candida tropicalis NOT DETECTED NOT DETECTED   Cryptococcus neoformans/gattii NOT DETECTED NOT DETECTED   CTX-M ESBL NOT DETECTED NOT DETECTED   Carbapenem resistance IMP NOT DETECTED NOT DETECTED   Carbapenem resistance KPC NOT DETECTED NOT DETECTED   Carbapenem resistance NDM NOT DETECTED NOT DETECTED   Carbapenem resist OXA 48 LIKE NOT DETECTED NOT DETECTED   Carbapenem resistance VIM NOT DETECTED NOT DETECTED    [name], PharmD, BCPS, BCIDP Infectious Diseases Clinical Pharmacist Phone: [phone] 04/07/2023  10:14 AM

## 2023-04-07 NOTE — Progress Notes (Signed)
  Echocardiogram 2D Echocardiogram has been performed.  [name] 04/07/2023, 1:52 PM

## 2023-04-07 NOTE — Progress Notes (Signed)
 [facility] Hospitalized [facility]osp[facility] Patient Visit  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 She was admitted 04/06/2023 with diagnosis of  Cholangitis. [facility] was notified by her facility of being sent to ED. Per Dr. Nolon Rod Monguilod, hospice physician, this is a related hospital admission.   Visited with patient with daughter at bedside. Patient is resting without sign of distress currently. Discussed plan of care with daughter. Discussed recent HIDA scan with results pending.   Patient is GIP appropriate for IV fluids, antibiotics and further evaluation of infection.   Vital Signs: 98.4/83/18   111/53   O2 94% on RA  I&O: not documented  Abnormal labs: BUN 25, CRE 1.68, Alk Phos 454, Alb 3.3, Ast 918, Alt 291, T.Pro 8.4, T.Bili 1.7, GFRe 28, lactic acid 4.5, WBC 18.5, HGB 11.1, BC x 2 pending  Diagnostics:   HIDA scan, results pending  IV/PRN Meds: maxipime 2G IV x 1, LR x 1, LR 118ml/hr, Flagyl 500mg  IV X 1, Vancomycin 1500mg  IV x 1    Assessment and Plan from note 3.24.25  Sepsis/ T=102/ wbc=18, diarrhea x 1 - gastroenteritis vs gallbladder pathology -  - Hida scan ordered per surgeon's recommendation.  Noted that the surgeon does not think the gallbladder is the cause of the patient's symptoms. - Vanc and Cefepime started epirically.   - Viral gastroenteritis is going around the patient's NH.  The patient did not have diarrhea until in the ambulance and has not had it since.     2.On hospice because of oral cancer - She is DNR per the hospice note.     3. Chronic right knee pain -  Continue Tramadol and Voltaren gel.  She may need something stronger.     Discharge Planning: Ongoing  Family Contact: spoke with daughter at bedside  IDT: Updated  Goals of Care: DNR  Glenna Fellows BSN, RN, [facility] hospital liaison [phone]

## 2023-04-07 NOTE — Progress Notes (Signed)
 Subjective: No complaints.  Family at bedside.  Patient had some pain 2 days ago that has resolved according to her daughter.  She was brought in last night due to "blue lips, tongue"  per her daughter.  She only had diarrhea in the ambulance, none since then.  Temp max here of 101.9.  she has eaten some FLD a few minutes ago.  ROS: See above, otherwise other systems negative  Objective: Vital signs in last 24 hours: Temp:  [98 F (36.7 C)-101.9 F (38.8 C)] 98.2 F (36.8 C) (03/24 0740) Pulse Rate:  [83-130] 88 (03/24 0740) Resp:  [15-29] 18 (03/24 0740) BP: (92-128)/(53-91) 122/65 (03/24 0740) SpO2:  [92 %-100 %] 94 % (03/24 0740) Weight:  [66.7 kg] 66.7 kg (03/23 1800) Last BM Date : 04/06/23  Intake/Output from previous day: No intake/output data recorded. Intake/Output this shift: No intake/output data recorded.  PE: Abd: soft, NT, ND, +BS  Lab Results:  Recent Labs    04/06/23 1715 04/07/23 1119  WBC 18.5* 15.4*  HGB 11.1* 8.8*  HCT 34.9* 27.2*  PLT 354 251   BMET Recent Labs    04/06/23 1715 04/07/23 1119  NA 132* 133*  K 4.3 3.7  CL 99 104  CO2 19* 20*  GLUCOSE 130* 95  BUN 25* 25*  CREATININE 1.68* 1.41*  CALCIUM 9.6 8.9   PT/INR No results for input(s): "LABPROT", "INR" in the last 72 hours. CMP     Component Value Date/Time   NA 133 (L) 04/07/2023 1119   K 3.7 04/07/2023 1119   CL 104 04/07/2023 1119   CO2 20 (L) 04/07/2023 1119   GLUCOSE 95 04/07/2023 1119   BUN 25 (H) 04/07/2023 1119   CREATININE 1.41 (H) 04/07/2023 1119   CREATININE 1.19 (H) 04/24/2021 1343   CALCIUM 8.9 04/07/2023 1119   PROT 6.6 04/07/2023 1119   ALBUMIN 2.7 (L) 04/07/2023 1119   AST 403 (H) 04/07/2023 1119   AST 17 04/24/2021 1343   ALT 224 (H) 04/07/2023 1119   ALT 10 04/24/2021 1343   ALKPHOS 346 (H) 04/07/2023 1119   BILITOT 3.2 (H) 04/07/2023 1119   BILITOT 0.3 04/24/2021 1343   GFRNONAA 34 (L) 04/07/2023 1119   GFRNONAA 42 (L) 04/24/2021 1343    GFRAA 43 (L) 04/27/2019 1250   Lipase  No results found for: "LIPASE"     Studies/Results: ECHOCARDIOGRAM COMPLETE Result Date: 04/07/2023    ECHOCARDIOGRAM REPORT   Patient Name:   [name] Date of Exam: 04/07/2023 Medical Rec #:  [id number]        Height:       66.0 in Accession #:    [id number]       Weight:       147.0 lb Date of Birth:  [birth year]       BSA:          1.755 m Patient Age:    89 years         BP:           122/65 mmHg Patient Gender: F                HR:           58 bpm. Exam Location:  Inpatient Procedure: 2D Echo, Cardiac Doppler and Color Doppler (Both Spectral and Color            Flow Doppler were utilized during procedure). Indications:    I50.40* Unspecified combined  systolic (congestive) and diastolic                 (congestive) heart failure  History:        Patient has prior history of Echocardiogram examinations, most                 recent 07/10/2020. Signs/Symptoms:Edema, Altered Mental Status                 and Alzheimer's; Risk Factors:Hypertension and Dyslipidemia.                 Cancer.  Sonographer:    Sheralyn Boatman RDCS Referring Phys: [id number] ERIC J [place] IMPRESSIONS  1. Left ventricular ejection fraction, by estimation, is 60 to 65%. The left ventricle has normal function. The left ventricle has no regional wall motion abnormalities. There is mild concentric left ventricular hypertrophy. Left ventricular diastolic parameters are consistent with Grade II diastolic dysfunction (pseudonormalization).  2. Right ventricular systolic function is normal. The right ventricular size is normal. There is mildly elevated pulmonary artery systolic pressure. The estimated right ventricular systolic pressure is 41.1 mmHg.  3. Left atrial size was severely dilated.  4. Right atrial size was mildly dilated.  5. The mitral valve is normal in structure. Moderate mitral valve regurgitation. No evidence of mitral stenosis. Moderate mitral annular calcification.  6. Tricuspid  valve regurgitation is moderate.  7. The aortic valve is tricuspid. There is mild calcification of the aortic valve. Aortic valve regurgitation is mild. Aortic valve sclerosis/calcification is present, without any evidence of aortic stenosis.  8. Aortic dilatation noted. There is borderline dilatation of the ascending aorta, measuring 38 mm. FINDINGS  Left Ventricle: Left ventricular ejection fraction, by estimation, is 60 to 65%. The left ventricle has normal function. The left ventricle has no regional wall motion abnormalities. The left ventricular internal cavity size was normal in size. There is  mild concentric left ventricular hypertrophy. Left ventricular diastolic parameters are consistent with Grade II diastolic dysfunction (pseudonormalization). Right Ventricle: The right ventricular size is normal. No increase in right ventricular wall thickness. Right ventricular systolic function is normal. There is mildly elevated pulmonary artery systolic pressure. The tricuspid regurgitant velocity is 2.79  m/s, and with an assumed right atrial pressure of 10 mmHg, the estimated right ventricular systolic pressure is 41.1 mmHg. Left Atrium: Left atrial size was severely dilated. Right Atrium: Right atrial size was mildly dilated. Pericardium: There is no evidence of pericardial effusion. Mitral Valve: The mitral valve is normal in structure. Moderate mitral annular calcification. Moderate mitral valve regurgitation, with centrally-directed jet. No evidence of mitral valve stenosis. MV peak gradient, 7.6 mmHg. The mean mitral valve gradient is 3.0 mmHg. Tricuspid Valve: The tricuspid valve is normal in structure. Tricuspid valve regurgitation is moderate . No evidence of tricuspid stenosis. Aortic Valve: The aortic valve is tricuspid. There is mild calcification of the aortic valve. Aortic valve regurgitation is mild. Aortic valve sclerosis/calcification is present, without any evidence of aortic stenosis. Pulmonic  Valve: The pulmonic valve was normal in structure. Pulmonic valve regurgitation is not visualized. No evidence of pulmonic stenosis. Aorta: Aortic dilatation noted. There is borderline dilatation of the ascending aorta, measuring 38 mm. IAS/Shunts: No atrial level shunt detected by color flow Doppler.  LEFT VENTRICLE PLAX 2D LVIDd:         5.10 cm     Diastology LVIDs:         3.10 cm     LV e' medial:  5.66 cm/s LV PW:         1.20 cm     LV E/e' medial:  23.1 LV IVS:        1.10 cm     LV e' lateral:   10.40 cm/s LVOT diam:     2.30 cm     LV E/e' lateral: 12.6 LV SV:         107 LV SV Index:   61 LVOT Area:     4.15 cm  LV Volumes (MOD) LV vol d, MOD A2C: 67.8 ml LV vol d, MOD A4C: 70.6 ml LV vol s, MOD A2C: 26.0 ml LV vol s, MOD A4C: 25.6 ml LV SV MOD A2C:     41.8 ml LV SV MOD A4C:     70.6 ml LV SV MOD BP:      45.2 ml RIGHT VENTRICLE             IVC RV S prime:     12.20 cm/s  IVC diam: 2.20 cm TAPSE (M-mode): 1.4 cm LEFT ATRIUM           Index        RIGHT ATRIUM           Index LA diam:      3.40 cm 1.94 cm/m   RA Area:     13.40 cm LA Vol (A2C): 24.9 ml 14.19 ml/m  RA Volume:   35.40 ml  20.18 ml/m LA Vol (A4C): 62.1 ml 35.39 ml/m  AORTIC VALVE LVOT Vmax:   132.00 cm/s LVOT Vmean:  86.500 cm/s LVOT VTI:    0.257 m  AORTA Ao Root diam: 3.10 cm Ao Asc diam:  3.80 cm MITRAL VALVE                TRICUSPID VALVE MV Area (PHT): 4.30 cm     TR Peak grad:   31.1 mmHg MV Area VTI:   2.47 cm     TR Vmax:        279.00 cm/s MV Peak grad:  7.6 mmHg MV Mean grad:  3.0 mmHg     SHUNTS MV Vmax:       1.38 m/s     Systemic VTI:  0.26 m MV Vmean:      82.0 cm/s    Systemic Diam: 2.30 cm MV Decel Time: 177 msec MV E velocity: 131.00 cm/s MV A velocity: 125.50 cm/s MV E/A ratio:  1.04 Arvilla Meres MD Electronically signed by Arvilla Meres MD Signature Date/Time: 04/07/2023/1:55:02 PM    Final    NM Hepatobiliary Liver Func Result Date: 04/07/2023 CLINICAL DATA:  Gallstones.  Evaluate for acute  cholecystitis. EXAM: NUCLEAR MEDICINE HEPATOBILIARY IMAGING TECHNIQUE: Sequential images of the abdomen were obtained out to 60 minutes following intravenous administration of radiopharmaceutical. RADIOPHARMACEUTICALS:  5.0 mCi Tc-71m  Choletec IV COMPARISON:  Abdominal sonogram from 04/08/2023 FINDINGS: Prompt uptake of activity by the liver is seen. After 60 minutes of imaging there was persistent radiotracer uptake throughout both lobes of the liver without signs of biliary or bowel activity.Patient was reimaged at 120 minutes with similar degree of diffuse uptake throughout the liver without biliary or bowel activity. IMPRESSION: 1. There is persistent radiotracer activity noted throughout the liver after 120 minutes of imaging without biliary or bowel activity. These findings may be seen in the setting of hepatic site dysfunction as well as common bile duct obstruction. Electronically Signed   By: Signa Kell M.D.   On:  04/07/2023 11:23   US Abdomen Limited RUQ (LIVER/GB) Result Date: 04/06/2023 CLINICAL DATA:  Elevated LFTs EXAM: ULTRASOUND ABDOMEN LIMITED RIGHT UPPER QUADRANT COMPARISON:  CT from earlier in the same day. FINDINGS: Gallbladder: Gallbladder is well distended. Gallbladder sludge is noted within. A few small mobile stones are seen. No wall thickening or pericholecystic fluid is noted. Negative sonographic Murphy's sign is elicited. Common bile duct: Diameter: 6 mm. Liver: No focal lesion identified. Within normal limits in parenchymal echogenicity. Portal vein is patent on color Doppler imaging with normal direction of blood flow towards the liver. Other: None. IMPRESSION: Gallbladder sludge and small stones. Electronically Signed   By: Alcide Clever M.D.   On: 04/06/2023 21:18   CT CHEST ABDOMEN PELVIS WO CONTRAST Result Date: 04/06/2023 CLINICAL DATA:  Peripheral cyanosis and diarrhea with abdominal pain EXAM: CT CHEST, ABDOMEN AND PELVIS WITHOUT CONTRAST TECHNIQUE: Multidetector CT  imaging of the chest, abdomen and pelvis was performed following the standard protocol without IV contrast. RADIATION DOSE REDUCTION: This exam was performed according to the departmental dose-optimization program which includes automated exposure control, adjustment of the mA and/or kV according to patient size and/or use of iterative reconstruction technique. COMPARISON:  07/10/2020 FINDINGS: CT CHEST FINDINGS Cardiovascular: Limited due to the lack of IV contrast. Atherosclerotic calcifications are seen. No aneurysmal dilatation is noted. Extensive calcification of the mitral annulus is noted. No cardiac enlargement is seen. Heavy coronary calcifications are noted as well. Mediastinum/Nodes: Thoracic inlet is within normal limits. No hilar or mediastinal adenopathy is noted. The esophagus as visualized is within normal limits. Lungs/Pleura: Lungs are well aerated bilaterally. Basilar atelectatic changes are seen. A small 3 mm nodule is noted in the right middle lobe laterally not well appreciated on the prior exam. No further follow-up is recommended. No other significant nodule is noted. Musculoskeletal: Degenerative changes of the thoracic spine are noted. Chronic T5 compression fracture is noted. Old healed manubrial fracture is seen. No definitive rib abnormality is noted. CT ABDOMEN PELVIS FINDINGS Hepatobiliary: No focal liver abnormality is seen. No gallstones, gallbladder wall thickening, or biliary dilatation. Pancreas: Unremarkable. No pancreatic ductal dilatation or surrounding inflammatory changes. Spleen: Normal in size without focal abnormality. Adrenals/Urinary Tract: Adrenal glands are within normal limits. Kidneys show no renal calculi or obstructive changes. A few small exophytic cysts are noted arising from the kidneys. No follow-up is recommended. The bladder is well distended. Stomach/Bowel: Scattered diverticular change of the colon is noted without evidence of diverticulitis. The appendix  is air-filled and within normal limits. Small bowel and stomach are unremarkable. Vascular/Lymphatic: Aortic atherosclerosis. No enlarged abdominal or pelvic lymph nodes. Reproductive: Uterus and bilateral adnexa are unremarkable. Other: No abdominal wall hernia or abnormality. No abdominopelvic ascites. Musculoskeletal: Degenerative changes of the right hip joint are noted with remodeling of the femoral head. No acute fracture is noted. Chronic L2 compression fracture is noted. IMPRESSION: Somewhat limited exam due to lack of IV contrast. Single nodule in the right middle lobe. No follow-up is recommended based on the size and patient's given age. Diverticular change without diverticulitis. Chronic bony changes as described. Electronically Signed   By: Alcide Clever M.D.   On: 04/06/2023 19:51    Anti-infectives: Anti-infectives (From admission, onward)    Start     Dose/Rate Route Frequency Ordered Stop   04/07/23 1800  ceFEPIme (MAXIPIME) 2 g in sodium chloride 0.9 % 100 mL IVPB  Status:  Discontinued        2 g 200 mL/hr over  30 Minutes Intravenous Every 24 hours 04/06/23 2150 04/07/23 1012   04/07/23 1400  cefTRIAXone (ROCEPHIN) 2 g in sodium chloride 0.9 % 100 mL IVPB        2 g 200 mL/hr over 30 Minutes Intravenous Every 24 hours 04/07/23 1012     04/06/23 2200  metroNIDAZOLE (FLAGYL) IVPB 500 mg  Status:  Discontinued        500 mg 100 mL/hr over 60 Minutes Intravenous Every 12 hours 04/06/23 2152 04/07/23 1012   04/06/23 1830  ceFEPIme (MAXIPIME) 2 g in sodium chloride 0.9 % 100 mL IVPB        2 g 200 mL/hr over 30 Minutes Intravenous  Once 04/06/23 1815 04/06/23 1957   04/06/23 1830  vancomycin (VANCOREADY) IVPB 1500 mg/300 mL        1,500 mg 150 mL/hr over 120 Minutes Intravenous  Once 04/06/23 1815 04/06/23 2140        Assessment/Plan Cholelithiasis, elevated LFTs -HIDA not helpful in determination of cholecystitis, but [place] negative for evidence of cholecystitis.   -suspect  the patient has a biliary obstruction from CBD stone based on labs and HIDA scan -primary has already spoken to the family regarding this plan and they are agreeable to proceed with MRCP. -if she does have CBD stone, then family is considering ERCP.  At that time, if she still have no symptoms of cholecystitis, we would defer surgical recommendations since she would've gotten a sphincterotomy, age, co-morbidities, and patient nor family are interested in surgical intervention, etc. -we will continue to follow for now.   FEN - FLD VTE - SCDs ID - Rocephin  I reviewed nursing notes, hospitalist notes, last 24 h vitals and pain scores, last 48 h intake and output, last 24 h labs and trends, and last 24 h imaging results.   LOS: 1 day    Letha Cape , [facility] 04/07/2023, 2:12 PM Please see Amion for pager number during day hours 7:00am-4:30pm or 7:00am -11:30am on weekends

## 2023-04-07 NOTE — Plan of Care (Signed)

## 2023-04-07 NOTE — Progress Notes (Signed)
 PROGRESS NOTE    RHEANNA Mcintosh  LKG:[id number] DOB: September 18, 1924 DOA: 04/06/2023 PCP: Ellan Lambert, NP    Brief Narrative:   [name] is a 89 y.o. 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  Apparently GI illness going around nursing facility.  On arrival, patient was placed on 3 L nasal cannula and temperature was noted to be 102.0 F.  Patient was transported to the ED for further evaluation management.  In the ED, temperature 101.9 F, HR 130, RR 26, BP 117/85, SpO2 100% on 3 L nasal cannula.  WBC 18.5, hemoglobin 11.1, platelet count 354.  Sodium 132, potassium 4.3, chloride 99, CO2 19, glucose 130, BUN 25, creatinine 1.68.  AST 19, ALT 291, total bilirubin 1.7.  Lactic acid 4.5.  COVID/influenza/RSV PCR negative.  Acute hepatitis panel negative.  Right upper quadrant abdominal ultrasound with gallbladder sludge and small stones.  CT chest/abdomen/pelvis without contrast with single nodule right middle lobe, no focal liver abnormality, no gallstones, no gallbladder wall thickening or biliary dilation; diverticular change without diverticulitis, chronic bony changes.  General surgery was consulted.  TRH consulted for admission.  Assessment & Plan:   Sepsis, POA E. coli bacteremia Lactic acidosis: resolved Patient presenting with a temperature of 101.9, elevated WBC count of 18.5 with lactic acid 4.5.  Noted elevated LFTs, total bilirubin.  Blood cultures x 2 positive for E. coli.  Denies urinary symptoms.  Suspect biliary/GI source. -- Lactic acid 4.5>3.9>1.0 -- WBC 18.5>15.4 -- Blood Cultures x 2: + GNRs, BCID + Ecoli; further notification/susceptibilities pending -- GI PCR panel: Pending --  Ceftriaxone 2 g IV every 24 hours -- Enteric precautions -- CBC daily  Transaminitis Concern for biliary obstruction Acute hepatitis panel negative.  Right upper quadrant ultrasound with gallbladder sludge and small stones.  HIDA scan with persistent radiotracer activity throughout the liver after 120 minutes without biliary or bowel activities concerning for hepatic site dysfunction versus common bile duct obstruction. --General Surgery following, appreciate assistance -- AST 918>403 -- ALT 291>224 -- Tbili 1.7>3.2 -- Avoid hepatotoxins -- Full liquid diet -- MRCP: Pending -- CMP daily  Hyponatremia -- Na 132>133 -- LR at 100 mL/h -- Repeat BMP in a.m.  Essential hypertension -- Metoprolol tartrate 25 mg p.o. daily -- Hold home furosemide  Hyperlipidemia Not on statin/lipid lipid lowering agent outpatient.  Depression -- Paroxetine 30 g p.o. daily -- Clonazepam 0.25 mg p.o. nightly  Right knee osteoarthritis -- Voltaren gel -- Tramadol 50 g p.o. 3 times daily as needed moderate pain  Dementia -- Delirium precautions -- Get up during the day -- Encourage a familiar face to remain present throughout the day -- Keep blinds open and lights on during daylight hours -- Minimize the use of opioids/benzodiazepines  Squamous cell carcinoma the oral cavity -- Currently on hospice     DVT prophylaxis: Place and maintain sequential compression device Start: 04/07/23 1412    Code Status: Do not attempt resuscitation (DNR) - Comfort care Family Communication: Updated multiple family members present at bedside this morning  Disposition Plan:  Level of care: Med-Surg Status is: Inpatient Remains inpatient appropriate because: Pending MRCP, may need ERCP versus cholecystostomy placement    Consultants:  General surgery  Procedures:  Right upper quadrant ultrasound  Antimicrobials:  Vancomycin 3/23 - 3/23  Cefepime 3/23 - 3/23  Metronidazole 3/23 -  3/23 Ceftriaxone 3/24>>   Subjective: Patient seen examined bedside, lying in bed.  Pleasantly confused.  Multiple family members present.  Just returned from HIDA scan with findings concerning for biliary obstruction given lack of tracer movements into the biliary tree/colon.  Discussed with family at bedside, will obtain MRCP for further evaluation.  Bilirubin worsening but LFTs improved today.  Patient with no specific complaints this morning.  Denies headache, no fever, no chest pain, no shortness of breath, no abdominal pain.  No acute events overnight per nurse staff.  Objective: Vitals:   04/06/23 2239 04/07/23 0031 04/07/23 0440 04/07/23 0740  BP: (!) 108/58 109/63 (!) 111/53 122/65  Pulse: (!) 110 92 83 88  Resp: 18 18 18 18   Temp: 98 F (36.7 C) 98.8 F (37.1 C) 98.4 F (36.9 C) 98.2 F (36.8 C)  TempSrc: Oral Oral Oral Oral  SpO2: 92% 96% 94% 94%  Weight:      Height:       No intake or output data in the 24 hours ending 04/07/23 1412 Filed Weights   04/06/23 1800  Weight: 66.7 kg    Examination:  Physical Exam: GEN: NAD, alert pleasantly confused, elderly in appearance HEENT: NCAT, PERRL, EOMI, sclera clear, MMM PULM: CTAB w/o wheezes/crackles, normal respiratory effort, on room air with SpO2 94% at rest CV: RRR w/o M/G/R GI: abd soft, NTND, + BS MSK: no peripheral edema, moves all extremities independently NEURO: No focal neurological deficit PSYCH: normal mood/affect Integumentary: No concerning rashes/lesions/wounds noted on exposed skin surfaces    Data Reviewed: I have personally reviewed following labs and imaging studies  CBC: Recent Labs  Lab 04/06/23 1715 04/07/23 1119  WBC 18.5* 15.4*  NEUTROABS 17.3*  --   HGB 11.1* 8.8*  HCT 34.9* 27.2*  MCV 103.9* 100.0  PLT 354 251   Basic Metabolic Panel: Recent Labs  Lab 04/06/23 1715 04/07/23 1119  NA 132* 133*  K 4.3 3.7  CL 99 104  CO2 19* 20*  GLUCOSE 130* 95  BUN 25* 25*  CREATININE  1.68* 1.41*  CALCIUM 9.6 8.9   GFR: Estimated Creatinine Clearance: 21.8 mL/min (A) (by C-G formula based on SCr of 1.41 mg/dL (H)). Liver Function Tests: Recent Labs  Lab 04/06/23 1715 04/07/23 1119  AST 918* 403*  ALT 291* 224*  ALKPHOS 454* 346*  BILITOT 1.7* 3.2*  PROT 8.4* 6.6  ALBUMIN 3.3* 2.7*   No results for input(s): "LIPASE", "AMYLASE" in the last 168 hours. No results for input(s): "AMMONIA" in the last 168 hours. Coagulation Profile: No results for input(s): "INR", "PROTIME" in the last 168 hours. Cardiac Enzymes: No results for input(s): "CKTOTAL", "CKMB", "CKMBINDEX", "TROPONINI" in the last 168 hours. BNP (last 3 results) No results for input(s): "PROBNP" in the last 8760 hours. HbA1C: No results for input(s): "HGBA1C" in the last 72 hours. CBG: No results for input(s): "GLUCAP" in the last 168 hours. Lipid Profile: No results for input(s): "CHOL", "HDL", "LDLCALC", "TRIG", "CHOLHDL", "LDLDIRECT" in the last 72 hours. Thyroid Function Tests: No results for input(s): "TSH", "T4TOTAL", "FREET4", "T3FREE", "THYROIDAB" in the last 72 hours. Anemia Panel: No results for input(s): "VITAMINB12", "FOLATE", "FERRITIN", "TIBC", "IRON", "RETICCTPCT" in the last 72 hours. Sepsis Labs: Recent Labs  Lab 04/06/23 1723 04/06/23 1844 04/07/23 1119  LATICACIDVEN 4.5* 3.9* 1.0    Recent Results (from the past 240 hours)  Blood culture (routine x  2)     Status: None (Preliminary result)   Collection Time: 04/06/23  5:00 PM   Specimen: BLOOD  Result Value Ref Range Status   Specimen Description BLOOD RIGHT ANTECUBITAL  Final   Special Requests   Final    BOTTLES DRAWN AEROBIC AND ANAEROBIC Blood Culture results may not be optimal due to an inadequate volume of blood received in culture bottles   Culture  Setup Time   Final    GRAM NEGATIVE RODS IN BOTH AEROBIC AND ANAEROBIC BOTTLES CRITICAL RESULT CALLED TO, READ BACK BY AND VERIFIED WITH: EMILY SINCLAIR ON 04/07/23 @  1000 BY DV Performed at [facility], [address] [place], [place] [address]    Culture GRAM NEGATIVE RODS  Final   Report Status PENDING  Incomplete  Blood Culture ID Panel (Reflexed)     Status: Abnormal   Collection Time: 04/06/23  5:00 PM  Result Value Ref Range Status   Enterococcus faecalis NOT DETECTED NOT DETECTED Final   Enterococcus Faecium NOT DETECTED NOT DETECTED Final   Listeria monocytogenes NOT DETECTED NOT DETECTED Final   Staphylococcus species NOT DETECTED NOT DETECTED Final   Staphylococcus aureus (BCID) NOT DETECTED NOT DETECTED Final   Staphylococcus epidermidis NOT DETECTED NOT DETECTED Final   Staphylococcus lugdunensis NOT DETECTED NOT DETECTED Final   Streptococcus species NOT DETECTED NOT DETECTED Final   Streptococcus agalactiae NOT DETECTED NOT DETECTED Final   Streptococcus pneumoniae NOT DETECTED NOT DETECTED Final   Streptococcus pyogenes NOT DETECTED NOT DETECTED Final   A.calcoaceticus-baumannii NOT DETECTED NOT DETECTED Final   Bacteroides fragilis NOT DETECTED NOT DETECTED Final   Enterobacterales DETECTED (A) NOT DETECTED Final    Comment: Enterobacterales represent a large order of gram negative bacteria, not a single organism. CRITICAL RESULT CALLED TO, READ BACK BY AND VERIFIED WITH: E. SINCLAIR PHARMD, AT 1007 04/07/23 D. VANHOOK    Enterobacter cloacae complex NOT DETECTED NOT DETECTED Final   Escherichia coli DETECTED (A) NOT DETECTED Final    Comment: CRITICAL RESULT CALLED TO, READ BACK BY AND VERIFIED WITH: E. SINCLAIR PHARMD, AT 1007 04/07/23 D. VANHOOK    Klebsiella aerogenes NOT DETECTED NOT DETECTED Final   Klebsiella oxytoca NOT DETECTED NOT DETECTED Final   Klebsiella pneumoniae NOT DETECTED NOT DETECTED Final   Proteus species NOT DETECTED NOT DETECTED Final   Salmonella species NOT DETECTED NOT DETECTED Final   Serratia marcescens NOT DETECTED NOT DETECTED Final   Haemophilus influenzae NOT DETECTED NOT DETECTED Final    Neisseria meningitidis NOT DETECTED NOT DETECTED Final   Pseudomonas aeruginosa NOT DETECTED NOT DETECTED Final   Stenotrophomonas maltophilia NOT DETECTED NOT DETECTED Final   Candida albicans NOT DETECTED NOT DETECTED Final   Candida auris NOT DETECTED NOT DETECTED Final   Candida glabrata NOT DETECTED NOT DETECTED Final   Candida krusei NOT DETECTED NOT DETECTED Final   Candida parapsilosis NOT DETECTED NOT DETECTED Final   Candida tropicalis NOT DETECTED NOT DETECTED Final   Cryptococcus neoformans/gattii NOT DETECTED NOT DETECTED Final   CTX-M ESBL NOT DETECTED NOT DETECTED Final   Carbapenem resistance IMP NOT DETECTED NOT DETECTED Final   Carbapenem resistance KPC NOT DETECTED NOT DETECTED Final   Carbapenem resistance NDM NOT DETECTED NOT DETECTED Final   Carbapenem resist OXA 48 LIKE NOT DETECTED NOT DETECTED Final   Carbapenem resistance VIM NOT DETECTED NOT DETECTED Final    Comment: Performed at [facility], [address] [place], [place] [address]  Blood culture (routine x 2)     Status: None (Preliminary result)   Collection Time: 04/06/23  5:15 PM   Specimen: BLOOD  Result Value Ref Range Status   Specimen Description BLOOD LEFT ANTECUBITAL  Final   Special Requests   Final    BOTTLES DRAWN AEROBIC AND ANAEROBIC Blood Culture results may not be optimal due to an inadequate volume of blood received in culture bottles   Culture  Setup Time   Final    GRAM NEGATIVE RODS ANAEROBIC BOTTLE ONLY Performed at [facility], [address] [place], [place] [address]    Culture GRAM NEGATIVE RODS  Final   Report Status PENDING  Incomplete  Resp panel by RT-PCR (RSV, Flu A&B, Covid) Anterior Nasal Swab     Status: None   Collection Time: 04/06/23  6:14 PM   Specimen: Anterior Nasal Swab  Result Value Ref Range Status   SARS Coronavirus 2 by RT PCR NEGATIVE NEGATIVE Final   Influenza A by PCR NEGATIVE NEGATIVE Final   Influenza B by PCR NEGATIVE NEGATIVE  Final    Comment: (NOTE) The Xpert Xpress SARS-CoV-2/FLU/RSV plus assay is intended as an aid in the diagnosis of influenza from Nasopharyngeal swab specimens and should not be used as a sole basis for treatment. Nasal washings and aspirates are unacceptable for Xpert Xpress SARS-CoV-2/FLU/RSV testing.  Fact Sheet for Patients: BloggerCourse.com  Fact Sheet for Healthcare Providers: SeriousBroker.it  This test is not yet approved or cleared by the [place] FDA and has been authorized for detection and/or diagnosis of SARS-CoV-2 by FDA under an Emergency Use Authorization (EUA). This EUA will remain in effect (meaning this test can be used) for the duration of the COVID-19 declaration under Section 564(b)(1) of the Act, 21 U.S.C. section 360bbb-3(b)(1), unless the authorization is terminated or revoked.     Resp Syncytial Virus by PCR NEGATIVE NEGATIVE Final    Comment: (NOTE) Fact Sheet for Patients: BloggerCourse.com  Fact Sheet for Healthcare Providers: SeriousBroker.it  This test is not yet approved or cleared by the [place] FDA and has been authorized for detection and/or diagnosis of SARS-CoV-2 by FDA under an Emergency Use Authorization (EUA). This EUA will remain in effect (meaning this test can be used) for the duration of the COVID-19 declaration under Section 564(b)(1) of the Act, 21 U.S.C. section 360bbb-3(b)(1), unless the authorization is terminated or revoked.  Performed at [facility], [address], [place], [place] [address]          Radiology Studies: ECHOCARDIOGRAM COMPLETE Result Date: 04/07/2023    ECHOCARDIOGRAM REPORT   Patient Name:   SHERREE SHANKMAN Date of Exam: 04/07/2023 Medical Rec #:  [id number]        Height:       66.0 in Accession #:    [id number]       Weight:       147.0 lb Date of Birth:  [birth year]       BSA:           1.755 m Patient Age:    89 years         BP:           122/65 mmHg Patient Gender: F                HR:           58 bpm. Exam Location:  Inpatient Procedure: 2D Echo, Cardiac Doppler and Color Doppler (Both Spectral  and Color            Flow Doppler were utilized during procedure). Indications:    I50.40* Unspecified combined systolic (congestive) and diastolic                 (congestive) heart failure  History:        Patient has prior history of Echocardiogram examinations, most                 recent 07/10/2020. Signs/Symptoms:Edema, Altered Mental Status                 and Alzheimer's; Risk Factors:Hypertension and Dyslipidemia.                 Cancer.  Sonographer:    Sheralyn Boatman RDCS Referring Phys: [id number] Merlon Alcorta J [place] IMPRESSIONS  1. Left ventricular ejection fraction, by estimation, is 60 to 65%. The left ventricle has normal function. The left ventricle has no regional wall motion abnormalities. There is mild concentric left ventricular hypertrophy. Left ventricular diastolic parameters are consistent with Grade II diastolic dysfunction (pseudonormalization).  2. Right ventricular systolic function is normal. The right ventricular size is normal. There is mildly elevated pulmonary artery systolic pressure. The estimated right ventricular systolic pressure is 41.1 mmHg.  3. Left atrial size was severely dilated.  4. Right atrial size was mildly dilated.  5. The mitral valve is normal in structure. Moderate mitral valve regurgitation. No evidence of mitral stenosis. Moderate mitral annular calcification.  6. Tricuspid valve regurgitation is moderate.  7. The aortic valve is tricuspid. There is mild calcification of the aortic valve. Aortic valve regurgitation is mild. Aortic valve sclerosis/calcification is present, without any evidence of aortic stenosis.  8. Aortic dilatation noted. There is borderline dilatation of the ascending aorta, measuring 38 mm. FINDINGS  Left Ventricle: Left ventricular  ejection fraction, by estimation, is 60 to 65%. The left ventricle has normal function. The left ventricle has no regional wall motion abnormalities. The left ventricular internal cavity size was normal in size. There is  mild concentric left ventricular hypertrophy. Left ventricular diastolic parameters are consistent with Grade II diastolic dysfunction (pseudonormalization). Right Ventricle: The right ventricular size is normal. No increase in right ventricular wall thickness. Right ventricular systolic function is normal. There is mildly elevated pulmonary artery systolic pressure. The tricuspid regurgitant velocity is 2.79  m/s, and with an assumed right atrial pressure of 10 mmHg, the estimated right ventricular systolic pressure is 41.1 mmHg. Left Atrium: Left atrial size was severely dilated. Right Atrium: Right atrial size was mildly dilated. Pericardium: There is no evidence of pericardial effusion. Mitral Valve: The mitral valve is normal in structure. Moderate mitral annular calcification. Moderate mitral valve regurgitation, with centrally-directed jet. No evidence of mitral valve stenosis. MV peak gradient, 7.6 mmHg. The mean mitral valve gradient is 3.0 mmHg. Tricuspid Valve: The tricuspid valve is normal in structure. Tricuspid valve regurgitation is moderate . No evidence of tricuspid stenosis. Aortic Valve: The aortic valve is tricuspid. There is mild calcification of the aortic valve. Aortic valve regurgitation is mild. Aortic valve sclerosis/calcification is present, without any evidence of aortic stenosis. Pulmonic Valve: The pulmonic valve was normal in structure. Pulmonic valve regurgitation is not visualized. No evidence of pulmonic stenosis. Aorta: Aortic dilatation noted. There is borderline dilatation of the ascending aorta, measuring 38 mm. IAS/Shunts: No atrial level shunt detected by color flow Doppler.  LEFT VENTRICLE PLAX 2D LVIDd:  5.10 cm     Diastology LVIDs:         3.10 cm      LV e' medial:    5.66 cm/s LV PW:         1.20 cm     LV E/e' medial:  23.1 LV IVS:        1.10 cm     LV e' lateral:   10.40 cm/s LVOT diam:     2.30 cm     LV E/e' lateral: 12.6 LV SV:         107 LV SV Index:   61 LVOT Area:     4.15 cm  LV Volumes (MOD) LV vol d, MOD A2C: 67.8 ml LV vol d, MOD A4C: 70.6 ml LV vol s, MOD A2C: 26.0 ml LV vol s, MOD A4C: 25.6 ml LV SV MOD A2C:     41.8 ml LV SV MOD A4C:     70.6 ml LV SV MOD BP:      45.2 ml RIGHT VENTRICLE             IVC RV S prime:     12.20 cm/s  IVC diam: 2.20 cm TAPSE (M-mode): 1.4 cm LEFT ATRIUM           Index        RIGHT ATRIUM           Index LA diam:      3.40 cm 1.94 cm/m   RA Area:     13.40 cm LA Vol (A2C): 24.9 ml 14.19 ml/m  RA Volume:   35.40 ml  20.18 ml/m LA Vol (A4C): 62.1 ml 35.39 ml/m  AORTIC VALVE LVOT Vmax:   132.00 cm/s LVOT Vmean:  86.500 cm/s LVOT VTI:    0.257 m  AORTA Ao Root diam: 3.10 cm Ao Asc diam:  3.80 cm MITRAL VALVE                TRICUSPID VALVE MV Area (PHT): 4.30 cm     TR Peak grad:   31.1 mmHg MV Area VTI:   2.47 cm     TR Vmax:        279.00 cm/s MV Peak grad:  7.6 mmHg MV Mean grad:  3.0 mmHg     SHUNTS MV Vmax:       1.38 m/s     Systemic VTI:  0.26 m MV Vmean:      82.0 cm/s    Systemic Diam: 2.30 cm MV Decel Time: 177 msec MV E velocity: 131.00 cm/s MV A velocity: 125.50 cm/s MV E/A ratio:  1.04 Arvilla Meres MD Electronically signed by Arvilla Meres MD Signature Date/Time: 04/07/2023/1:55:02 PM    Final    NM Hepatobiliary Liver Func Result Date: 04/07/2023 CLINICAL DATA:  Gallstones.  Evaluate for acute cholecystitis. EXAM: NUCLEAR MEDICINE HEPATOBILIARY IMAGING TECHNIQUE: Sequential images of the abdomen were obtained out to 60 minutes following intravenous administration of radiopharmaceutical. RADIOPHARMACEUTICALS:  5.0 mCi Tc-41m  Choletec IV COMPARISON:  Abdominal sonogram from 04/08/2023 FINDINGS: Prompt uptake of activity by the liver is seen. After 60 minutes of imaging there was  persistent radiotracer uptake throughout both lobes of the liver without signs of biliary or bowel activity.Patient was reimaged at 120 minutes with similar degree of diffuse uptake throughout the liver without biliary or bowel activity. IMPRESSION: 1. There is persistent radiotracer activity noted throughout the liver after 120 minutes of imaging without biliary or bowel activity. These findings  may be seen in the setting of hepatic site dysfunction as well as common bile duct obstruction. Electronically Signed   By: Signa Kell M.D.   On: 04/07/2023 11:23   US Abdomen Limited RUQ (LIVER/GB) Result Date: 04/06/2023 CLINICAL DATA:  Elevated LFTs EXAM: ULTRASOUND ABDOMEN LIMITED RIGHT UPPER QUADRANT COMPARISON:  CT from earlier in the same day. FINDINGS: Gallbladder: Gallbladder is well distended. Gallbladder sludge is noted within. A few small mobile stones are seen. No wall thickening or pericholecystic fluid is noted. Negative sonographic Murphy's sign is elicited. Common bile duct: Diameter: 6 mm. Liver: No focal lesion identified. Within normal limits in parenchymal echogenicity. Portal vein is patent on color Doppler imaging with normal direction of blood flow towards the liver. Other: None. IMPRESSION: Gallbladder sludge and small stones. Electronically Signed   By: Alcide Clever M.D.   On: 04/06/2023 21:18   CT CHEST ABDOMEN PELVIS WO CONTRAST Result Date: 04/06/2023 CLINICAL DATA:  Peripheral cyanosis and diarrhea with abdominal pain EXAM: CT CHEST, ABDOMEN AND PELVIS WITHOUT CONTRAST TECHNIQUE: Multidetector CT imaging of the chest, abdomen and pelvis was performed following the standard protocol without IV contrast. RADIATION DOSE REDUCTION: This exam was performed according to the departmental dose-optimization program which includes automated exposure control, adjustment of the mA and/or kV according to patient size and/or use of iterative reconstruction technique. COMPARISON:  07/10/2020  FINDINGS: CT CHEST FINDINGS Cardiovascular: Limited due to the lack of IV contrast. Atherosclerotic calcifications are seen. No aneurysmal dilatation is noted. Extensive calcification of the mitral annulus is noted. No cardiac enlargement is seen. Heavy coronary calcifications are noted as well. Mediastinum/Nodes: Thoracic inlet is within normal limits. No hilar or mediastinal adenopathy is noted. The esophagus as visualized is within normal limits. Lungs/Pleura: Lungs are well aerated bilaterally. Basilar atelectatic changes are seen. A small 3 mm nodule is noted in the right middle lobe laterally not well appreciated on the prior exam. No further follow-up is recommended. No other significant nodule is noted. Musculoskeletal: Degenerative changes of the thoracic spine are noted. Chronic T5 compression fracture is noted. Old healed manubrial fracture is seen. No definitive rib abnormality is noted. CT ABDOMEN PELVIS FINDINGS Hepatobiliary: No focal liver abnormality is seen. No gallstones, gallbladder wall thickening, or biliary dilatation. Pancreas: Unremarkable. No pancreatic ductal dilatation or surrounding inflammatory changes. Spleen: Normal in size without focal abnormality. Adrenals/Urinary Tract: Adrenal glands are within normal limits. Kidneys show no renal calculi or obstructive changes. A few small exophytic cysts are noted arising from the kidneys. No follow-up is recommended. The bladder is well distended. Stomach/Bowel: Scattered diverticular change of the colon is noted without evidence of diverticulitis. The appendix is air-filled and within normal limits. Small bowel and stomach are unremarkable. Vascular/Lymphatic: Aortic atherosclerosis. No enlarged abdominal or pelvic lymph nodes. Reproductive: Uterus and bilateral adnexa are unremarkable. Other: No abdominal wall hernia or abnormality. No abdominopelvic ascites. Musculoskeletal: Degenerative changes of the right hip joint are noted with  remodeling of the femoral head. No acute fracture is noted. Chronic L2 compression fracture is noted. IMPRESSION: Somewhat limited exam due to lack of IV contrast. Single nodule in the right middle lobe. No follow-up is recommended based on the size and patient's given age. Diverticular change without diverticulitis. Chronic bony changes as described. Electronically Signed   By: Alcide Clever M.D.   On: 04/06/2023 19:51        Scheduled Meds:  aspirin EC  81 mg Oral Daily   clonazepam  0.25 mg Oral QHS  diclofenac Sodium  4 g Topical TID AC & HS   metoprolol tartrate  25 mg Oral Daily   [START ON 04/08/2023] pantoprazole  40 mg Oral Q0600   Continuous Infusions:  cefTRIAXone (ROCEPHIN)  IV     lactated ringers 100 mL/hr (04/07/23 1100)     LOS: 1 day    Time spent: 53 minutes spent on 04/07/2023 caring for this patient face-to-face including chart review, ordering labs/tests, documenting, discussion with nursing staff, consultants, updating family and interview/physical exam    Alvira Philips [place], DO [facility] Available via Epic secure chat 7am-7pm After these hours, please refer to coverage provider listed on [email or web address] 04/07/2023, 2:12 PM

## 2023-04-07 NOTE — Progress Notes (Signed)
   04/07/23 1451  TOC Brief Assessment  Insurance and Status Reviewed ([employer])  Patient has primary care physician Yes (Dr. 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 Now admitted here under GIP Hospice services [facility] is aware  Prior/Current Home Services Current home services [facility])  Social Drivers of Health Review SDOH reviewed no interventions necessary  Readmission risk has been reviewed Yes (16%)  Transition of care needs no transition of care needs at this time   [facility] will follow for any DC needs and will notify [facility]  Glenna Fellows [phone] when patient DC's back to [facility]

## 2023-04-08 ENCOUNTER — Inpatient Hospital Stay (HOSPITAL_COMMUNITY)

## 2023-04-08 DIAGNOSIS — R17 Unspecified jaundice: Secondary | ICD-10-CM

## 2023-04-08 DIAGNOSIS — K8309 Other cholangitis: Secondary | ICD-10-CM | POA: Diagnosis not present

## 2023-04-08 DIAGNOSIS — R7881 Bacteremia: Secondary | ICD-10-CM | POA: Diagnosis not present

## 2023-04-08 LAB — COMPREHENSIVE METABOLIC PANEL
ALT: 145 U/L — ABNORMAL HIGH (ref 0–44)
AST: 157 U/L — ABNORMAL HIGH (ref 15–41)
Albumin: 2.7 g/dL — ABNORMAL LOW (ref 3.5–5.0)
Alkaline Phosphatase: 324 U/L — ABNORMAL HIGH (ref 38–126)
Anion gap: 9 (ref 5–15)
BUN: 17 mg/dL (ref 8–23)
CO2: 20 mmol/L — ABNORMAL LOW (ref 22–32)
Calcium: 9 mg/dL (ref 8.9–10.3)
Chloride: 106 mmol/L (ref 98–111)
Creatinine, Ser: 1.02 mg/dL — ABNORMAL HIGH (ref 0.44–1.00)
GFR, Estimated: 50 mL/min — ABNORMAL LOW (ref >60)
Glucose, Bld: 80 mg/dL (ref 70–99)
Potassium: 3.8 mmol/L (ref 3.5–5.1)
Sodium: 135 mmol/L (ref 135–145)
Total Bilirubin: 3.1 mg/dL — ABNORMAL HIGH (ref 0.0–1.2)
Total Protein: 6.9 g/dL (ref 6.5–8.1)

## 2023-04-08 LAB — CBC
HCT: 29.5 % — ABNORMAL LOW (ref 36.0–46.0)
Hemoglobin: 9.6 g/dL — ABNORMAL LOW (ref 12.0–15.0)
MCH: 32.7 pg (ref 26.0–34.0)
MCHC: 32.5 g/dL (ref 30.0–36.0)
MCV: 100.3 fL — ABNORMAL HIGH (ref 80.0–100.0)
Platelets: 259 10*3/uL (ref 150–400)
RBC: 2.94 MIL/uL — ABNORMAL LOW (ref 3.87–5.11)
RDW: 11.9 % (ref 11.5–15.5)
WBC: 10.5 10*3/uL (ref 4.0–10.5)
nRBC: 0 % (ref 0.0–0.2)

## 2023-04-08 MED ORDER — GADOBUTROL 1 MMOL/ML IV SOLN
6.0000 mL | Freq: Once | INTRAVENOUS | Status: AC | PRN
Start: 2023-04-08 — End: 2023-04-08
  Administered 2023-04-08: 6 mL via INTRAVENOUS

## 2023-04-08 MED ORDER — HALOPERIDOL LACTATE 5 MG/ML IJ SOLN
1.0000 mg | Freq: Once | INTRAMUSCULAR | Status: AC | PRN
  Administered 2023-04-08: 1 mg via INTRAVENOUS
  Filled 2023-04-08: qty 1

## 2023-04-08 NOTE — Progress Notes (Signed)
 [facility] Patient Visit   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 She was admitted 04/06/2023 with diagnosis of  Cholangitis. [facility] was notified by her facility of being sent to ED. Per Dr. Nolon Rod Monguilod, hospice physician, this is a related hospital admission.    Visited with patient with daughter at bedside. Patient is lethargic and daughter reports that she had a difficult night with worsening effects of her dementia. Required mits to protect lines and prns were given for agitation with little effect per daughter. Discussed plan of care. Patient had MRI MRCP this am and results are pending.    Patient is GIP appropriate for IV fluids, antibiotics and further evaluation.    Vital Signs: 98.1/79/17    134/68    O2 91% on RA   I&O: not documented   Abnormal labs: Cre 1.02, Alk Phos 324, ALT 145, Alb 2.4, AST 157, T. Pro 3.1, GFRe 50, HGB 9.6   Diagnostics: MRCP results pending.    NM Hepatobiliary Liver Func  IMPRESSION: 1. There is persistent radiotracer activity noted throughout the liver after 120 minutes of imaging without biliary or bowel activity. These findings may be seen in the setting of hepatic site dysfunction as well as common bile duct obstruction.   IV/PRN Meds: Rocephin 2G IV x 1, LR 139ml/hr (discontinued now), Haldol 1mg  IV x1, ultram 50mg  po x 1.    Assessment and Plan from note 3.24.25  Sepsis, POA E. coli bacteremia Lactic acidosis: resolved Patient presenting with a temperature of 101.9, elevated WBC count of 18.5 with lactic acid 4.5.  Noted elevated LFTs, total bilirubin.  Blood cultures x 2 positive for E. coli.  Denies urinary symptoms.  Suspect biliary/GI source. -- Lactic acid 4.5>3.9>1.0 -- WBC 18.5>15.4 -- Blood Cultures x 2: + GNRs, BCID + Ecoli; further notification/susceptibilities pending -- GI PCR panel: Pending -- Ceftriaxone 2 g IV  every 24 hours -- Enteric precautions -- CBC daily   Transaminitis Concern for biliary obstruction Acute hepatitis panel negative.  Right upper quadrant ultrasound with gallbladder sludge and small stones.  HIDA scan with persistent radiotracer activity throughout the liver after 120 minutes without biliary or bowel activities concerning for hepatic site dysfunction versus common bile duct obstruction. --General Surgery following, appreciate assistance -- AST 918>403 -- ALT 291>224 -- Tbili 1.7>3.2 -- Avoid hepatotoxins -- Full liquid diet -- MRCP: Pending -- CMP daily   Hyponatremia -- Na 132>133 -- LR at 100 mL/h -- Repeat BMP in a.m.    Dementia -- Delirium precautions -- Get up during the day -- Encourage a familiar face to remain present throughout the day -- Keep blinds open and lights on during daylight hours -- Minimize the use of opioids/benzodiazepines   Discharge Planning: Ongoing   Family Contact: spoke with daughter at bedside   IDT: Updated   Goals of Care: DNR   Glenna Fellows BSN, RN, [facility] hospital liaison [phone]

## 2023-04-08 NOTE — Progress Notes (Signed)
 PROGRESS NOTE    [name]  GMW:[id number] DOB: [birth year] DOA: 04/06/2023 PCP: Ellan Lambert, NP    Brief Narrative:   [name] is a 89 y.o. 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  Apparently GI illness going around nursing facility.  On arrival, patient was placed on 3 L nasal cannula and temperature was noted to be 102.0 F.  Patient was transported to the ED for further evaluation management.  In the ED, temperature 101.9 F, HR 130, RR 26, BP 117/85, SpO2 100% on 3 L nasal cannula.  WBC 18.5, hemoglobin 11.1, platelet count 354.  Sodium 132, potassium 4.3, chloride 99, CO2 19, glucose 130, BUN 25, creatinine 1.68.  AST 19, ALT 291, total bilirubin 1.7.  Lactic acid 4.5.  COVID/influenza/RSV PCR negative.  Acute hepatitis panel negative.  Right upper quadrant abdominal ultrasound with gallbladder sludge and small stones.  CT chest/abdomen/pelvis without contrast with single nodule right middle lobe, no focal liver abnormality, no gallstones, no gallbladder wall thickening or biliary dilation; diverticular change without diverticulitis, chronic bony changes.  General surgery was consulted.  TRH consulted for admission.  Assessment & Plan:   Sepsis, POA E. coli bacteremia Lactic acidosis: resolved Patient presenting with a temperature of 101.9, elevated WBC count of 18.5 with lactic acid 4.5.  Noted elevated LFTs, total bilirubin.  Blood cultures x 2 positive for E. coli.  Denies urinary symptoms.  Suspect biliary/GI source. -- Lactic acid 4.5>3.9>1.0 -- WBC 18.5>15.4>10.5 -- Blood Cultures x 2: + GNRs, BCID + Ecoli; further notification/susceptibilities pending -- GI PCR panel: Pending --  Ceftriaxone 2 g IV every 24 hours -- Enteric precautions -- CBC daily  Transaminitis Concern for biliary obstruction Acute hepatitis panel negative.  Right upper quadrant ultrasound with gallbladder sludge and small stones.  HIDA scan with persistent radiotracer activity throughout the liver after 120 minutes without biliary or bowel activities concerning for hepatic site dysfunction versus common bile duct obstruction. --General Surgery following, appreciate assistance -- AST [phone] -- ALT [phone] -- Tbili 1.7>3.2>3.1 -- Avoid hepatotoxins -- Full liquid diet -- MRCP: Pending -- Will consult GI after MRCP results as patient would likely benefit from ERCP with sphincterotomy given poor operative candidate but that per general surgery -- CMP daily  Hyponatremia -- Na 132>133>135 -- LR at 100 mL/h -- Repeat BMP in a.m.  Essential hypertension -- Metoprolol tartrate 25 mg p.o. daily -- Hold home furosemide  Hyperlipidemia Not on statin/lipid lipid lowering agent outpatient.  Depression -- Paroxetine 30 mg p.o. daily -- Clonazepam 0.25 mg p.o. nightly  Right knee osteoarthritis -- Voltaren gel -- Tramadol 50 g p.o. 3 times daily as needed moderate pain  Dementia -- Delirium precautions -- Get up during the day -- Encourage a familiar face to remain present throughout the day -- Keep blinds open and lights on during daylight hours -- Minimize the use of opioids/benzodiazepines  Squamous cell carcinoma the oral cavity -- Currently on hospice     DVT prophylaxis: Place and maintain sequential compression device Start: 04/07/23 1412    Code Status: Do not attempt resuscitation (DNR) - Comfort care Family Communication: Updated multiple family members present at bedside this morning  Disposition Plan:  Level of care: Med-Surg Status is: Inpatient Remains  inpatient appropriate because: Pending MRCP, likely would benefit from ERCP with sphincterotomy has not a  optimal surgical candidate    Consultants:  General surgery  Procedures:  Right upper quadrant ultrasound  Antimicrobials:  Vancomycin 3/23 - 3/23  Cefepime 3/23 - 3/23  Metronidazole 3/23 - 3/23 Ceftriaxone 3/24>>   Subjective: Patient seen examined bedside, lying in bed.  Pleasantly confused.  Multiple family members present.  Just returned from MRCP this morning, awaiting results.  LFTs trending down, total bilirubin remains about the same at 3.1.  Discussed with general surgery this morning, not the best operative candidate and would probably benefit from ERCP with sphincterotomy given her gallstones.  Patient with no specific complaints this morning.  Denies headache, no fever, no chest pain, no shortness of breath, no abdominal pain.  No acute events overnight per nursing staff.  Objective: Vitals:   04/07/23 2039 04/08/23 0429 04/08/23 0817 04/08/23 0837  BP: 125/84 137/68 134/68 134/68  Pulse: 71 87 79 79  Resp: 18 18 17    Temp: 98.5 F (36.9 C) 98.3 F (36.8 C) 98.1 F (36.7 C)   TempSrc: Oral  Oral   SpO2: 98% 97% 91%   Weight:      Height:        Intake/Output Summary (Last 24 hours) at 04/08/2023 1136 Last data filed at 04/07/2023 1700 Gross per 24 hour  Intake 718 ml  Output --  Net 718 ml   Filed Weights   04/06/23 1800  Weight: 66.7 kg    Examination:  Physical Exam: GEN: NAD, alert pleasantly confused, elderly in appearance HEENT: NCAT, PERRL, EOMI, sclera clear, MMM PULM: CTAB w/o wheezes/crackles, normal respiratory effort, on room air with SpO2 94% at rest CV: RRR w/o M/G/R GI: abd soft, NTND, + BS MSK: no peripheral edema, moves all extremities independently NEURO: No focal neurological deficit PSYCH: normal mood/affect Integumentary: No concerning rashes/lesions/wounds noted on exposed skin surfaces    Data Reviewed: I have personally reviewed following labs and imaging studies  CBC: Recent Labs  Lab 04/06/23 1715 04/07/23 1119  04/08/23 0923  WBC 18.5* 15.4* 10.5  NEUTROABS 17.3*  --   --   HGB 11.1* 8.8* 9.6*  HCT 34.9* 27.2* 29.5*  MCV 103.9* 100.0 100.3*  PLT 354 251 259   Basic Metabolic Panel: Recent Labs  Lab 04/06/23 1715 04/07/23 1119 04/08/23 0923  NA 132* 133* 135  K 4.3 3.7 3.8  CL 99 104 106  CO2 19* 20* 20*  GLUCOSE 130* 95 80  BUN 25* 25* 17  CREATININE 1.68* 1.41* 1.02*  CALCIUM 9.6 8.9 9.0   GFR: Estimated Creatinine Clearance: 30.2 mL/min (A) (by C-G formula based on SCr of 1.02 mg/dL (H)). Liver Function Tests: Recent Labs  Lab 04/06/23 1715 04/07/23 1119 04/08/23 0923  AST 918* 403* 157*  ALT 291* 224* 145*  ALKPHOS 454* 346* 324*  BILITOT 1.7* 3.2* 3.1*  PROT 8.4* 6.6 6.9  ALBUMIN 3.3* 2.7* 2.7*   No results for input(s): "LIPASE", "AMYLASE" in the last 168 hours. No results for input(s): "AMMONIA" in the last 168 hours. Coagulation Profile: No results for input(s): "INR", "PROTIME" in the last 168 hours. Cardiac Enzymes: No results for input(s): "CKTOTAL", "CKMB", "CKMBINDEX", "TROPONINI" in the last 168 hours. BNP (last 3 results) No results for input(s): "PROBNP" in the last 8760 hours. HbA1C: No results for input(s): "HGBA1C" in the last 72 hours. CBG: No results for input(s): "GLUCAP" in the last 168 hours. Lipid Profile: No results  for input(s): "CHOL", "HDL", "LDLCALC", "TRIG", "CHOLHDL", "LDLDIRECT" in the last 72 hours. Thyroid Function Tests: No results for input(s): "TSH", "T4TOTAL", "FREET4", "T3FREE", "THYROIDAB" in the last 72 hours. Anemia Panel: No results for input(s): "VITAMINB12", "FOLATE", "FERRITIN", "TIBC", "IRON", "RETICCTPCT" in the last 72 hours. Sepsis Labs: Recent Labs  Lab 04/06/23 1723 04/06/23 1844 04/07/23 1119  LATICACIDVEN 4.5* 3.9* 1.0    Recent Results (from the past 240 hours)  Blood culture (routine x 2)     Status: Abnormal (Preliminary result)   Collection Time: 04/06/23  5:00 PM   Specimen: BLOOD  Result Value  Ref Range Status   Specimen Description BLOOD RIGHT ANTECUBITAL  Final   Special Requests   Final    BOTTLES DRAWN AEROBIC AND ANAEROBIC Blood Culture results may not be optimal due to an inadequate volume of blood received in culture bottles   Culture  Setup Time   Final    GRAM NEGATIVE RODS IN BOTH AEROBIC AND ANAEROBIC BOTTLES CRITICAL RESULT CALLED TO, READ BACK BY AND VERIFIED WITH: EMILY SINCLAIR ON 04/07/23 @ 1000 BY DV Performed at [facility], [address] [place], [place] [address]    Culture ESCHERICHIA COLI (A)  Final   Report Status PENDING  Incomplete  Blood Culture ID Panel (Reflexed)     Status: Abnormal   Collection Time: 04/06/23  5:00 PM  Result Value Ref Range Status   Enterococcus faecalis NOT DETECTED NOT DETECTED Final   Enterococcus Faecium NOT DETECTED NOT DETECTED Final   Listeria monocytogenes NOT DETECTED NOT DETECTED Final   Staphylococcus species NOT DETECTED NOT DETECTED Final   Staphylococcus aureus (BCID) NOT DETECTED NOT DETECTED Final   Staphylococcus epidermidis NOT DETECTED NOT DETECTED Final   Staphylococcus lugdunensis NOT DETECTED NOT DETECTED Final   Streptococcus species NOT DETECTED NOT DETECTED Final   Streptococcus agalactiae NOT DETECTED NOT DETECTED Final   Streptococcus pneumoniae NOT DETECTED NOT DETECTED Final   Streptococcus pyogenes NOT DETECTED NOT DETECTED Final   A.calcoaceticus-baumannii NOT DETECTED NOT DETECTED Final   Bacteroides fragilis NOT DETECTED NOT DETECTED Final   Enterobacterales DETECTED (A) NOT DETECTED Final    Comment: Enterobacterales represent a large order of gram negative bacteria, not a single organism. CRITICAL RESULT CALLED TO, READ BACK BY AND VERIFIED WITH: E. SINCLAIR PHARMD, AT 1007 04/07/23 D. VANHOOK    Enterobacter cloacae complex NOT DETECTED NOT DETECTED Final   Escherichia coli DETECTED (A) NOT DETECTED Final    Comment: CRITICAL RESULT CALLED TO, READ BACK BY AND VERIFIED WITH: E.  SINCLAIR PHARMD, AT 1007 04/07/23 D. VANHOOK    Klebsiella aerogenes NOT DETECTED NOT DETECTED Final   Klebsiella oxytoca NOT DETECTED NOT DETECTED Final   Klebsiella pneumoniae NOT DETECTED NOT DETECTED Final   Proteus species NOT DETECTED NOT DETECTED Final   Salmonella species NOT DETECTED NOT DETECTED Final   Serratia marcescens NOT DETECTED NOT DETECTED Final   Haemophilus influenzae NOT DETECTED NOT DETECTED Final   Neisseria meningitidis NOT DETECTED NOT DETECTED Final   Pseudomonas aeruginosa NOT DETECTED NOT DETECTED Final   Stenotrophomonas maltophilia NOT DETECTED NOT DETECTED Final   Candida albicans NOT DETECTED NOT DETECTED Final   Candida auris NOT DETECTED NOT DETECTED Final   Candida glabrata NOT DETECTED NOT DETECTED Final   Candida krusei NOT DETECTED NOT DETECTED Final   Candida parapsilosis NOT DETECTED NOT DETECTED Final   Candida tropicalis NOT DETECTED NOT DETECTED Final   Cryptococcus neoformans/gattii NOT DETECTED NOT DETECTED Final  CTX-M ESBL NOT DETECTED NOT DETECTED Final   Carbapenem resistance IMP NOT DETECTED NOT DETECTED Final   Carbapenem resistance KPC NOT DETECTED NOT DETECTED Final   Carbapenem resistance NDM NOT DETECTED NOT DETECTED Final   Carbapenem resist OXA 48 LIKE NOT DETECTED NOT DETECTED Final   Carbapenem resistance VIM NOT DETECTED NOT DETECTED Final    Comment: Performed at [facility], [address], [place], [place] [address]  Blood culture (routine x 2)     Status: None (Preliminary result)   Collection Time: 04/06/23  5:15 PM   Specimen: BLOOD  Result Value Ref Range Status   Specimen Description BLOOD LEFT ANTECUBITAL  Final   Special Requests   Final    BOTTLES DRAWN AEROBIC AND ANAEROBIC Blood Culture results may not be optimal due to an inadequate volume of blood received in culture bottles   Culture  Setup Time   Final    GRAM NEGATIVE RODS IN BOTH AEROBIC AND ANAEROBIC BOTTLES CRITICAL VALUE NOTED.  VALUE IS  CONSISTENT WITH PREVIOUSLY REPORTED AND CALLED VALUE. Performed at [facility], [address], [place], [place] [address]    Culture GRAM NEGATIVE RODS  Final   Report Status PENDING  Incomplete  Resp panel by RT-PCR (RSV, Flu A&B, Covid) Anterior Nasal Swab     Status: None   Collection Time: 04/06/23  6:14 PM   Specimen: Anterior Nasal Swab  Result Value Ref Range Status   SARS Coronavirus 2 by RT PCR NEGATIVE NEGATIVE Final   Influenza A by PCR NEGATIVE NEGATIVE Final   Influenza B by PCR NEGATIVE NEGATIVE Final    Comment: (NOTE) The Xpert Xpress SARS-CoV-2/FLU/RSV plus assay is intended as an aid in the diagnosis of influenza from Nasopharyngeal swab specimens and should not be used as a sole basis for treatment. Nasal washings and aspirates are unacceptable for Xpert Xpress SARS-CoV-2/FLU/RSV testing.  Fact Sheet for Patients: BloggerCourse.com  Fact Sheet for Healthcare Providers: SeriousBroker.it  This test is not yet approved or cleared by the [place] FDA and has been authorized for detection and/or diagnosis of SARS-CoV-2 by FDA under an Emergency Use Authorization (EUA). This EUA will remain in effect (meaning this test can be used) for the duration of the COVID-19 declaration under Section 564(b)(1) of the Act, 21 U.S.C. section 360bbb-3(b)(1), unless the authorization is terminated or revoked.     Resp Syncytial Virus by PCR NEGATIVE NEGATIVE Final    Comment: (NOTE) Fact Sheet for Patients: BloggerCourse.com  Fact Sheet for Healthcare Providers: SeriousBroker.it  This test is not yet approved or cleared by the [place] FDA and has been authorized for detection and/or diagnosis of SARS-CoV-2 by FDA under an Emergency Use Authorization (EUA). This EUA will remain in effect (meaning this test can be used) for the duration of the COVID-19  declaration under Section 564(b)(1) of the Act, 21 U.S.C. section 360bbb-3(b)(1), unless the authorization is terminated or revoked.  Performed at [facility], [address], [place], [place] [address]          Radiology Studies: ECHOCARDIOGRAM COMPLETE Result Date: 04/07/2023    ECHOCARDIOGRAM REPORT   Patient Name:   [name] Date of Exam: 04/07/2023 Medical Rec #:  [id number]        Height:       66.0 in Accession #:    [id number]       Weight:       147.0 lb Date of Birth:  July 25, 1924       BSA:          1.755 m Patient Age:    89 years         BP:           122/65 mmHg Patient Gender: F                HR:           58 bpm. Exam Location:  Inpatient Procedure: 2D Echo, Cardiac Doppler and Color Doppler (Both Spectral and Color            Flow Doppler were utilized during procedure). Indications:    I50.40* Unspecified combined systolic (congestive) and diastolic                 (congestive) heart failure  History:        Patient has prior history of Echocardiogram examinations, most                 recent 07/10/2020. Signs/Symptoms:Edema, Altered Mental Status                 and Alzheimer's; Risk Factors:Hypertension and Dyslipidemia.                 Cancer.  Sonographer:    Sheralyn Boatman RDCS Referring Phys: [id number] Shaquasia Caponigro J [place] IMPRESSIONS  1. Left ventricular ejection fraction, by estimation, is 60 to 65%. The left ventricle has normal function. The left ventricle has no regional wall motion abnormalities. There is mild concentric left ventricular hypertrophy. Left ventricular diastolic parameters are consistent with Grade II diastolic dysfunction (pseudonormalization).  2. Right ventricular systolic function is normal. The right ventricular size is normal. There is mildly elevated pulmonary artery systolic pressure. The estimated right ventricular systolic pressure is 41.1 mmHg.  3. Left atrial size was severely dilated.  4. Right atrial size was mildly dilated.  5. The mitral  valve is normal in structure. Moderate mitral valve regurgitation. No evidence of mitral stenosis. Moderate mitral annular calcification.  6. Tricuspid valve regurgitation is moderate.  7. The aortic valve is tricuspid. There is mild calcification of the aortic valve. Aortic valve regurgitation is mild. Aortic valve sclerosis/calcification is present, without any evidence of aortic stenosis.  8. Aortic dilatation noted. There is borderline dilatation of the ascending aorta, measuring 38 mm. FINDINGS  Left Ventricle: Left ventricular ejection fraction, by estimation, is 60 to 65%. The left ventricle has normal function. The left ventricle has no regional wall motion abnormalities. The left ventricular internal cavity size was normal in size. There is  mild concentric left ventricular hypertrophy. Left ventricular diastolic parameters are consistent with Grade II diastolic dysfunction (pseudonormalization). Right Ventricle: The right ventricular size is normal. No increase in right ventricular wall thickness. Right ventricular systolic function is normal. There is mildly elevated pulmonary artery systolic pressure. The tricuspid regurgitant velocity is 2.79  m/s, and with an assumed right atrial pressure of 10 mmHg, the estimated right ventricular systolic pressure is 41.1 mmHg. Left Atrium: Left atrial size was severely dilated. Right Atrium: Right atrial size was mildly dilated. Pericardium: There is no evidence of pericardial effusion. Mitral Valve: The mitral valve is normal in structure. Moderate mitral annular calcification. Moderate mitral valve regurgitation, with centrally-directed jet. No evidence of mitral valve stenosis. MV peak gradient, 7.6 mmHg. The mean mitral valve gradient is 3.0 mmHg. Tricuspid Valve: The tricuspid valve is normal in structure. Tricuspid valve regurgitation is moderate . No evidence of  tricuspid stenosis. Aortic Valve: The aortic valve is tricuspid. There is mild calcification of  the aortic valve. Aortic valve regurgitation is mild. Aortic valve sclerosis/calcification is present, without any evidence of aortic stenosis. Pulmonic Valve: The pulmonic valve was normal in structure. Pulmonic valve regurgitation is not visualized. No evidence of pulmonic stenosis. Aorta: Aortic dilatation noted. There is borderline dilatation of the ascending aorta, measuring 38 mm. IAS/Shunts: No atrial level shunt detected by color flow Doppler.  LEFT VENTRICLE PLAX 2D LVIDd:         5.10 cm     Diastology LVIDs:         3.10 cm     LV e' medial:    5.66 cm/s LV PW:         1.20 cm     LV E/e' medial:  23.1 LV IVS:        1.10 cm     LV e' lateral:   10.40 cm/s LVOT diam:     2.30 cm     LV E/e' lateral: 12.6 LV SV:         107 LV SV Index:   61 LVOT Area:     4.15 cm  LV Volumes (MOD) LV vol d, MOD A2C: 67.8 ml LV vol d, MOD A4C: 70.6 ml LV vol s, MOD A2C: 26.0 ml LV vol s, MOD A4C: 25.6 ml LV SV MOD A2C:     41.8 ml LV SV MOD A4C:     70.6 ml LV SV MOD BP:      45.2 ml RIGHT VENTRICLE             IVC RV S prime:     12.20 cm/s  IVC diam: 2.20 cm TAPSE (M-mode): 1.4 cm LEFT ATRIUM           Index        RIGHT ATRIUM           Index LA diam:      3.40 cm 1.94 cm/m   RA Area:     13.40 cm LA Vol (A2C): 24.9 ml 14.19 ml/m  RA Volume:   35.40 ml  20.18 ml/m LA Vol (A4C): 62.1 ml 35.39 ml/m  AORTIC VALVE LVOT Vmax:   132.00 cm/s LVOT Vmean:  86.500 cm/s LVOT VTI:    0.257 m  AORTA Ao Root diam: 3.10 cm Ao Asc diam:  3.80 cm MITRAL VALVE                TRICUSPID VALVE MV Area (PHT): 4.30 cm     TR Peak grad:   31.1 mmHg MV Area VTI:   2.47 cm     TR Vmax:        279.00 cm/s MV Peak grad:  7.6 mmHg MV Mean grad:  3.0 mmHg     SHUNTS MV Vmax:       1.38 m/s     Systemic VTI:  0.26 m MV Vmean:      82.0 cm/s    Systemic Diam: 2.30 cm MV Decel Time: 177 msec MV E velocity: 131.00 cm/s MV A velocity: 125.50 cm/s MV E/A ratio:  1.04 Brianna Meres MD Electronically signed by Brianna Meres MD Signature  Date/Time: 04/07/2023/1:55:02 PM    Final    NM Hepatobiliary Liver Func Result Date: 04/07/2023 CLINICAL DATA:  Gallstones.  Evaluate for acute cholecystitis. EXAM: NUCLEAR MEDICINE HEPATOBILIARY IMAGING TECHNIQUE: Sequential images of the abdomen were obtained out to 60 minutes following intravenous  administration of radiopharmaceutical. RADIOPHARMACEUTICALS:  5.0 mCi Tc-102m  Choletec IV COMPARISON:  Abdominal sonogram from 04/08/2023 FINDINGS: Prompt uptake of activity by the liver is seen. After 60 minutes of imaging there was persistent radiotracer uptake throughout both lobes of the liver without signs of biliary or bowel activity.Patient was reimaged at 120 minutes with similar degree of diffuse uptake throughout the liver without biliary or bowel activity. IMPRESSION: 1. There is persistent radiotracer activity noted throughout the liver after 120 minutes of imaging without biliary or bowel activity. These findings may be seen in the setting of hepatic site dysfunction as well as common bile duct obstruction. Electronically Signed   By: Signa Kell M.D.   On: 04/07/2023 11:23   US Abdomen Limited RUQ (LIVER/GB) Result Date: 04/06/2023 CLINICAL DATA:  Elevated LFTs EXAM: ULTRASOUND ABDOMEN LIMITED RIGHT UPPER QUADRANT COMPARISON:  CT from earlier in the same day. FINDINGS: Gallbladder: Gallbladder is well distended. Gallbladder sludge is noted within. A few small mobile stones are seen. No wall thickening or pericholecystic fluid is noted. Negative sonographic Murphy's sign is elicited. Common bile duct: Diameter: 6 mm. Liver: No focal lesion identified. Within normal limits in parenchymal echogenicity. Portal vein is patent on color Doppler imaging with normal direction of blood flow towards the liver. Other: None. IMPRESSION: Gallbladder sludge and small stones. Electronically Signed   By: Alcide Clever M.D.   On: 04/06/2023 21:18   CT CHEST ABDOMEN PELVIS WO CONTRAST Result Date:  04/06/2023 CLINICAL DATA:  Peripheral cyanosis and diarrhea with abdominal pain EXAM: CT CHEST, ABDOMEN AND PELVIS WITHOUT CONTRAST TECHNIQUE: Multidetector CT imaging of the chest, abdomen and pelvis was performed following the standard protocol without IV contrast. RADIATION DOSE REDUCTION: This exam was performed according to the departmental dose-optimization program which includes automated exposure control, adjustment of the mA and/or kV according to patient size and/or use of iterative reconstruction technique. COMPARISON:  07/10/2020 FINDINGS: CT CHEST FINDINGS Cardiovascular: Limited due to the lack of IV contrast. Atherosclerotic calcifications are seen. No aneurysmal dilatation is noted. Extensive calcification of the mitral annulus is noted. No cardiac enlargement is seen. Heavy coronary calcifications are noted as well. Mediastinum/Nodes: Thoracic inlet is within normal limits. No hilar or mediastinal adenopathy is noted. The esophagus as visualized is within normal limits. Lungs/Pleura: Lungs are well aerated bilaterally. Basilar atelectatic changes are seen. A small 3 mm nodule is noted in the right middle lobe laterally not well appreciated on the prior exam. No further follow-up is recommended. No other significant nodule is noted. Musculoskeletal: Degenerative changes of the thoracic spine are noted. Chronic T5 compression fracture is noted. Old healed manubrial fracture is seen. No definitive rib abnormality is noted. CT ABDOMEN PELVIS FINDINGS Hepatobiliary: No focal liver abnormality is seen. No gallstones, gallbladder wall thickening, or biliary dilatation. Pancreas: Unremarkable. No pancreatic ductal dilatation or surrounding inflammatory changes. Spleen: Normal in size without focal abnormality. Adrenals/Urinary Tract: Adrenal glands are within normal limits. Kidneys show no renal calculi or obstructive changes. A few small exophytic cysts are noted arising from the kidneys. No follow-up is  recommended. The bladder is well distended. Stomach/Bowel: Scattered diverticular change of the colon is noted without evidence of diverticulitis. The appendix is air-filled and within normal limits. Small bowel and stomach are unremarkable. Vascular/Lymphatic: Aortic atherosclerosis. No enlarged abdominal or pelvic lymph nodes. Reproductive: Uterus and bilateral adnexa are unremarkable. Other: No abdominal wall hernia or abnormality. No abdominopelvic ascites. Musculoskeletal: Degenerative changes of the right hip joint are noted with remodeling  of the femoral head. No acute fracture is noted. Chronic L2 compression fracture is noted. IMPRESSION: Somewhat limited exam due to lack of IV contrast. Single nodule in the right middle lobe. No follow-up is recommended based on the size and patient's given age. Diverticular change without diverticulitis. Chronic bony changes as described. Electronically Signed   By: Alcide Clever M.D.   On: 04/06/2023 19:51        Scheduled Meds:  aspirin EC  81 mg Oral Daily   clonazepam  0.25 mg Oral QHS   diclofenac Sodium  4 g Topical TID AC & HS   metoprolol tartrate  25 mg Oral Daily   pantoprazole  40 mg Oral Q0600   Continuous Infusions:  cefTRIAXone (ROCEPHIN)  IV 2 g (04/07/23 1534)     LOS: 2 days    Time spent: 53 minutes spent on 04/08/2023 caring for this patient face-to-face including chart review, ordering labs/tests, documenting, discussion with nursing staff, consultants, updating family and interview/physical exam    Alvira Philips [place], DO [facility] Available via Epic secure chat 7am-7pm After these hours, please refer to coverage provider listed on [email or web address] 04/08/2023, 11:36 AM

## 2023-04-08 NOTE — Plan of Care (Signed)

## 2023-04-08 NOTE — Consult Note (Addendum)
 Attending physician's note   I have taken a history, reviewed the chart, and examined the patient. I performed a substantive portion of this encounter, including complete performance of at least one of the key components, in conjunction with the APP. I agree with the APP's note, impression, and recommendations with my edits.   89 year old female with medical history as outlined below, presents with fever, confusion, gram-negative bacteremia.  Family at bedside and assists in history.  Mental status improving today per family.  Admission labs notable for significant leukocytosis (18.5) and elevated liver enzymes (AST/ALT 918/291, ALP 454, T. bili peak 3.2 yesterday), lactic acidosis (4.5), and AKI.  Labs all improving now.  Admission ultrasound with GB sludge and small stones with normal CBD.  MRCP with mild intra/extrahepatic duct dilation without CDL, distended GB with internal sludge without cholecystitis.  1) Cholangitis 2) E. coli bacteremia/sepsis 3) Elevated liver enzymes-improving 4) Leukocytosis-improving 5) Lactic acidosis-resolved 6) AKI-improving  I long conversation with patient and multiple family members at bedside today.  We discussed admitting diagnosis along with treatment options.  We discussed the role/utility of ERCP, to include the risk/benefit profile.  As she is clinically improving, given her advanced age, they would very much prefer a more conservative approach.  Given overall clinical status, I think this is a very reasonable option. - Continue IV ABX - Continue trending liver enzymes and CBC daily - Continue IV fluids - No plan to proceed with ERCP at this juncture - If liver enzymes uptrending, change in clinical status, or other concern for harbored infection, will reconsider ERCP, which would undoubtedly be elevated risk given age and comorbidities - Ok to resume diet - GI service will continue to follow   Doristine Locks, DO, FACG [phone] office          Consultation  Referring Provider: [facility] Primary Care Physician:  Ellan Lambert, NP Primary Gastroenterologist: Gentry Fitz  Reason for Consultation: Gram-negative bacteremia, concern for possible cholangitis  HPI: [name] is a 89 y.o. female nursing home resident who was admitted through the emergency room late on 04/06/2023 after she developed fever to 100 and was noted to be cyanotic per nursing home staff.  She apparently also had had an episode of diarrhea. She has history of dementia that her family says she is generally not confused, also with history of prior TIA, hypertension hyperlipidemia and oral squamous cell carcinoma. She met SIRS criteria. Workup in the ER with CT of the chest, abdomen and pelvis without IV contrast shows a single nodule in the right middle lobe, diverticular changes without diverticulitis, no focal liver abnormality, no gallstones or gallbladder wall thickening or ductal dilation. Abdominal ultrasound shows gallbladder sludge and a few small stones, no gallbladder wall thickening or pericholecystic fluid CBD 6 mm HIDA scan showed persistent tracer activity throughout the liver after 120 minutes with no biliary or bowel activity consider hepatic dysfunction versus CBD obstruction  MRI/MRCP completed today-shows normal-appearing liver, no significant gallbladder wall thickening, internal sludge, mild intra and extrahepatic biliary ductal dilation with CBD of 10 mm and no definite evidence of choledocholithiasis, small bilateral pleural effusions and bibasilar pulmonary atelectasis.  Initial labs-with WBC 18.5/hemoglobin 11.1/hematocrit 34.9 T. bili 1.7/alk phos 454/AST 918/ALT 291 BUN 25/creatinine 1.68 Lactate 4.5 Respiratory panel negative, acute hepatitis panel negative Blood cultures were done and are now growing E. Coli  She has been on IV antibiotics since admission.>Rocephin  Lactate yesterday had normalized to 1  Labs  today  show WBC of 10.5/hemoglobin 9.6/Mehta crit 29.5 BUN 17/creatinine 1.02 T. bili 3.1/alk phos 324/AST 157/ALT 145  Patient's daughter and other family members are at bedside, they say she is mentating better today, did have a rough night last night was confused, getting undressed and did not rest all night.  She is able to say she feels okay today and has no complaints of abdominal pain.  They are not aware of her having any abdominal pain at the time of admission nor any recent complaints of abdominal pain.  Her other medical issues include prior small CVA, carotid artery stenosis, arthritis, depression, hypertension. She has not been on any blood thinners    Past Medical History:  Diagnosis Date   Arthritis 04/13/2009   Right knee   Carotid artery occlusion 07/15/2003   Identified at the time of stroke   COVID-19 virus infection 07/09/2020   Admitted with weakness, found to have UTI and COVID-19.  Treated with 3 days of remdesivir. ->  Remains extremely debilitated   Depression    Depression    GERD (gastroesophageal reflux disease)    Hyperlipidemia    Hypertension    Stroke (HCC) 07/15/2003   Mini   Thoracic kyphosis     Past Surgical History:  Procedure Laterality Date   CATARACT EXTRACTION W/ INTRAOCULAR LENS  IMPLANT, BILATERAL     TRANSTHORACIC ECHOCARDIOGRAM  07/10/2020   In setting of UTI and COVID-19 infection, mildly elevated <100 hs Troponin (Demand ischemia) - Normal EF 60 to 65%.  No R WMA mild LVH.  GRII DD.  Moderately elevated PAP.  Moderate LA dilation.  Mild to moderate MR.  Moderate TR.  (very) mild aortic stenosis.  (Mean gradient 8 mmHg).    Prior to Admission medications   Medication Sig Start Date End Date Taking? Authorizing Provider  acetaminophen (TYLENOL) 325 MG tablet Take 650 mg by mouth 3 (three) times daily.   Yes [provider]  aspirin 81 MG tablet Take 81 mg by mouth daily.   Yes [provider]  benzocaine (ORAJEL) 10  % mucosal gel Use as directed 1 Application in the mouth or throat 3 (three) times daily as needed for mouth pain.   Yes [provider]  Benzocaine-Resorcinol (VAGISIL) 5-2 % CREA Apply 1 Application topically at bedtime.   Yes [provider]  camphor-menthol Wynelle Fanny) lotion Apply 1 Application topically 2 (two) times daily as needed for itching. Apply to upper and lower body as needed   Yes [provider]  carboxymethylcellulose (REFRESH TEARS) 0.5 % SOLN Place 1 drop into both eyes 2 (two) times daily.   Yes [provider]  clonazePAM (KLONOPIN) 0.5 MG tablet Take 0.25 mg by mouth at bedtime.   Yes [provider]  diclofenac Sodium (VOLTAREN) 1 % GEL Apply 4 g topically 4 (four) times daily. Apply 4 grams topically to right knee for pain; monitor for nausea 08/25/20  Yes [provider]  diphenhydrAMINE (BENADRYL) 12.5 MG/5ML liquid Take 12.5 mg by mouth every 6 (six) hours as needed for allergies (mouth pain). Swish and spit   Yes [provider]  furosemide (LASIX) 20 MG tablet Take 20 mg by mouth daily. 09/28/20  Yes [provider]  metoprolol tartrate (LOPRESSOR) 25 MG tablet Take 12.5 mg by mouth daily. Do not give if SBP <120 or HR is <60 BPM 06/12/17  Yes [provider]  pantoprazole (PROTONIX) 40 MG tablet Take 1 tablet (40 mg total) by mouth daily  at 6 (six) AM. 07/21/20  Yes Lanae Boast, MD  PARoxetine (PAXIL) 30 MG tablet Take 30 mg by mouth daily.    Yes [provider]  sodium chloride (SALINE MIST) 0.65 % nasal spray Place 1 spray into the nose daily.   Yes [provider]  Soft Lens Products (SM SALINE SOLUTION) SOLN Take 10 mLs by mouth 3 (three) times daily after meals. Rinse out mouth   Yes [provider]  traMADol (ULTRAM) 50 MG tablet Take 50 mg by mouth 3 (three) times daily.   Yes [provider]    Current Facility-Administered Medications  Medication Dose  Route Frequency Provider Last Rate Last Admin   acetaminophen (TYLENOL) tablet 650 mg  650 mg Oral Q6H PRN Buena Irish, MD       Or   acetaminophen (TYLENOL) suppository 650 mg  650 mg Rectal Q6H PRN Buena Irish, MD       aspirin EC tablet 81 mg  81 mg Oral Daily Buena Irish, MD   81 mg at 04/08/23 2130   cefTRIAXone (ROCEPHIN) 2 g in sodium chloride 0.9 % 100 mL IVPB  2 g Intravenous Q24H [place], Eric J, DO 200 mL/hr at 04/08/23 1334 2 g at 04/08/23 1334   clonazePAM (KLONOPIN) disintegrating tablet 0.25 mg  0.25 mg Oral QHS [place], Alvira Philips, DO   0.25 mg at 04/07/23 2100   diclofenac Sodium (VOLTAREN) 1 % topical gel 4 g  4 g Topical TID AC & HS Buena Irish, MD   4 g at 04/08/23 1256   metoprolol tartrate (LOPRESSOR) tablet 25 mg  25 mg Oral Daily Buena Irish, MD   25 mg at 04/08/23 0837   ondansetron (ZOFRAN) tablet 4 mg  4 mg Oral Q6H PRN Buena Irish, MD       Or   ondansetron [facility]) injection 4 mg  4 mg Intravenous Q6H PRN Buena Irish, MD       pantoprazole (PROTONIX) EC tablet 40 mg  40 mg Oral Q0600 [place], Eric J, DO       traMADol Janean Sark) tablet 50 mg  50 mg Oral TID PRN Buena Irish, MD   50 mg at 04/08/23 0100    Allergies as of 04/06/2023 - Review Complete 04/06/2023  Allergen Reaction Noted   Fioricet-codeine [butalbital-apap-caff-cod] Other (See Comments) 04/06/2023   Fluogen [influenza virus vaccine]  07/01/2020   Guaifenesin-codeine Other (See Comments) 04/06/2023   Penicillins  03/28/2010   Sulfa antibiotics  03/28/2010   Codeine Other (See Comments) 03/28/2010   Penicillin g Rash 07/12/2015   Prednisone Palpitations 03/28/2010   Sulfamethoxazole-trimethoprim Rash 07/12/2015    Family History  Problem Relation Age of Onset   Heart disease Brother    Coronary artery disease Brother    Heart disease Brother    Heart disease Brother    Ovarian cancer Daughter 40       Died at 85   Breast cancer Neg Hx      Social History   Socioeconomic History   Marital status: Widowed    Spouse name: Not on file   Number of children: Not on file   Years of education: Not on file   Highest education level: Not on file  Occupational History   Not on file  Tobacco Use   Smoking status: Never   Smokeless tobacco: Former    Types: Snuff  Vaping Use   Vaping status: Never Used  Substance and Sexual Activity   Alcohol use: No  Alcohol/week: 0.0 standard drinks of alcohol   Drug use: No   Sexual activity: Not Currently  Other Topics Concern   Not on file  Social History Narrative   Currently He Is a Resident at [facility]) -> very debilitated, minimal PT. mostly in wheelchair now.      NOK/Agent: Daughter: Leanne Lovely, (son-in-law Caprice Kluver); [phone], [place] [address]; [phone]; [phone])      Additional Emergency Contact:   *Tommy and Karma Greaser; [phone] [address] [place], [place] [address]   [phone]), [phone] Tobi Bastos)   Social Drivers of Health   Financial Resource Strain: Not on file  Food Insecurity: No Food Insecurity (04/06/2023)   Hunger Vital Sign    Worried About Running Out of Food in the Last Year: Never true    Ran Out of Food in the Last Year: Never true  Transportation Needs: No Transportation Needs (04/06/2023)   PRAPARE - Transportation    Lack of Transportation (Medical): No    Lack of Transportation (Non-Medical): No  Physical Activity: Not on file  Stress: Not on file  Social Connections: Unknown (04/06/2023)   Social Connection and Isolation Panel [NHANES]    Frequency of Communication with Friends and Family: Patient unable to answer    Frequency of Social Gatherings with Friends and Family: Patient unable to answer    Attends Religious Services: Patient unable to answer    Active Member of Clubs or Organizations: Patient unable to answer    Attends Banker  Meetings: Patient unable to answer    Marital Status: Separated  Intimate Partner Violence: Not At Risk (04/06/2023)   Humiliation, Afraid, Rape, and Kick questionnaire    Fear of Current or Ex-Partner: No    Emotionally Abused: No    Physically Abused: No    Sexually Abused: No    Review of Systems: Pertinent positive and negative review of systems were noted in the above HPI section.  All other review of systems was otherwise negative. Marland Kitchen  Physical Exam: Vital signs in last 24 hours: Temp:  [98.1 F (36.7 C)-98.5 F (36.9 C)] 98.1 F (36.7 C) (03/25 0817) Pulse Rate:  [71-87] 79 (03/25 0837) Resp:  [17-19] 17 (03/25 0817) BP: (125-142)/(68-84) 134/68 (03/25 0837) SpO2:  [91 %-98 %] 91 % (03/25 0817) Last BM Date : 04/07/23 General:   Alert,  Well-developed, well-nourished,very elderly white female, pleasantly confused and cooperative in NAD, family at bedside Head:  Normocephalic and atraumatic. Eyes:  Sclera very early icterus, conjunctiva pink. Ears:  Normal auditory acuity. Nose:  No deformity, discharge,  or lesions. Mouth:  No deformity or lesions.   Neck:  Supple; no masses or thyromegaly. Lungs:  Clear throughout to auscultation.   No wheezes, crackles, or rhonchi.  Heart:  Regular rate and rhythm; no murmurs, clicks, rubs,  or gallops. Abdomen:  Soft,nontender, BS active,nonpalp mass or hsm.   Rectal: Not done Msk:  Symmetrical without gross deformities. . Pulses:  Normal pulses noted. Extremities:  Without clubbing or edema. Neurologic:  Alert and  oriented x1;  grossly normal neurologically. Skin:  Intact without significant lesions or rashes.. Psych:  Alert and cooperative.   Intake/Output from previous day: 03/24 0701 - 03/25 0700 In: 718 [P.O.:118; I.V.:500; IV Piggyback:100] Out: -  Intake/Output this shift: No intake/output data recorded.  Lab Results: Recent Labs    04/06/23 1715 04/07/23 1119 04/08/23 0923  WBC 18.5* 15.4* 10.5  HGB 11.1* 8.8*  9.6*  HCT 34.9* 27.2* 29.5*  PLT 354 251 259   BMET Recent Labs    04/06/23 1715 04/07/23 1119 04/08/23 0923  NA 132* 133* 135  K 4.3 3.7 3.8  CL 99 104 106  CO2 19* 20* 20*  GLUCOSE 130* 95 80  BUN 25* 25* 17  CREATININE 1.68* 1.41* 1.02*  CALCIUM 9.6 8.9 9.0   LFT Recent Labs    04/08/23 0923  PROT 6.9  ALBUMIN 2.7*  AST 157*  ALT 145*  ALKPHOS 324*  BILITOT 3.1*   PT/INR No results for input(s): "LABPROT", "INR" in the last 72 hours. Hepatitis Panel Recent Labs    04/06/23 1835  HEPBSAG NON REACTIVE  HCVAB NON REACTIVE  HEPAIGM NON REACTIVE  HEPBIGM NON REACTIVE    IMPRESSION:  #86 89 year old white female, nursing home resident admitted late on 04/06/2023 after she developed a fever to 102 and was noted to have perioral cyanosis by nursing home staff. Tachycardic and febrile to 101.9 on admit  Workup pertinent for significant leukocytosis and elevated LFTs as well as acute kidney injury Imaging workup as outlined above with noncontrasted CT which was unremarkable, then ultrasound which revealed gallbladder sludge but no gallbladder wall thickening, few small stones present, CBD of 6 mm  MRI/MRCP completed this morning shows a distended gallbladder with internal sludge, no significant gallbladder wall thickening, there is mild intra and extrahepatic biliary dilation without definite choledocholithiasis.  She has improved with IV antibiotics, WBC has normalized and LFTs are improving though T. bili is lagging.  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  Patient has been on hospice care at her nursing facility.  Family reasonably concerned about her undergoing any  general anesthesia. As she does not have any definite cholelithiasis and is improving with time and IV antibiotics (Rocephin) we do not plan to proceed with ERCP at this time. Would continue IV antibiotics, complete course for bacteremia Advance to full liquid diet Continue to follow labs on a daily basis-if she continues to improve and LFTs continue to trend down then no ERCP, if LFTs completely plateau or rise, then can reconsider need for ERCP with sphincterotomy. Family is comfortable with this plan, GI will continue to follow with you   Amy Esterwood PA-C 04/08/2023, 4:23 PM

## 2023-04-09 DIAGNOSIS — R17 Unspecified jaundice: Secondary | ICD-10-CM | POA: Diagnosis not present

## 2023-04-09 DIAGNOSIS — K8309 Other cholangitis: Secondary | ICD-10-CM | POA: Diagnosis not present

## 2023-04-09 LAB — CBC
HCT: 32.4 % — ABNORMAL LOW (ref 36.0–46.0)
Hemoglobin: 10.5 g/dL — ABNORMAL LOW (ref 12.0–15.0)
MCH: 32.8 pg (ref 26.0–34.0)
MCHC: 32.4 g/dL (ref 30.0–36.0)
MCV: 101.3 fL — ABNORMAL HIGH (ref 80.0–100.0)
Platelets: 296 10*3/uL (ref 150–400)
RBC: 3.2 MIL/uL — ABNORMAL LOW (ref 3.87–5.11)
RDW: 11.9 % (ref 11.5–15.5)
WBC: 10.1 10*3/uL (ref 4.0–10.5)
nRBC: 0 % (ref 0.0–0.2)

## 2023-04-09 LAB — CULTURE, BLOOD (ROUTINE X 2)

## 2023-04-09 LAB — COMPREHENSIVE METABOLIC PANEL
ALT: 98 U/L — ABNORMAL HIGH (ref 0–44)
AST: 73 U/L — ABNORMAL HIGH (ref 15–41)
Albumin: 2.6 g/dL — ABNORMAL LOW (ref 3.5–5.0)
Alkaline Phosphatase: 342 U/L — ABNORMAL HIGH (ref 38–126)
Anion gap: 10 (ref 5–15)
BUN: 15 mg/dL (ref 8–23)
CO2: 22 mmol/L (ref 22–32)
Calcium: 9.2 mg/dL (ref 8.9–10.3)
Chloride: 106 mmol/L (ref 98–111)
Creatinine, Ser: 0.98 mg/dL (ref 0.44–1.00)
GFR, Estimated: 53 mL/min — ABNORMAL LOW (ref >60)
Glucose, Bld: 89 mg/dL (ref 70–99)
Potassium: 3.9 mmol/L (ref 3.5–5.1)
Sodium: 138 mmol/L (ref 135–145)
Total Bilirubin: 1.6 mg/dL — ABNORMAL HIGH (ref 0.0–1.2)
Total Protein: 6.9 g/dL (ref 6.5–8.1)

## 2023-04-09 LAB — GASTROINTESTINAL PANEL BY PCR, STOOL (REPLACES STOOL CULTURE)

## 2023-04-09 LAB — PROTIME-INR
INR: 1 (ref 0.8–1.2)
Prothrombin Time: 13.5 s (ref 11.4–15.2)

## 2023-04-09 MED ORDER — PAROXETINE HCL 30 MG PO TABS
30.0000 mg | ORAL_TABLET | Freq: Every day | ORAL | Status: DC
  Administered 2023-04-09 – 2023-04-13 (×5): 30 mg via ORAL
  Filled 2023-04-09 (×5): qty 1

## 2023-04-09 MED ORDER — SODIUM CHLORIDE 0.9 % IV SOLN: 2.0000 g | INTRAVENOUS | Status: DC

## 2023-04-09 MED ORDER — BENZOCAINE 10 % MT GEL: 1.0000 | Freq: Three times a day (TID) | OROMUCOSAL | Status: DC | PRN

## 2023-04-09 MED ORDER — VAGISIL 5-2 % EX CREA: 1.0000 | TOPICAL_CREAM | Freq: Every day | CUTANEOUS | Status: DC

## 2023-04-09 MED ORDER — ENSURE ENLIVE PO LIQD
237.0000 mL | Freq: Three times a day (TID) | ORAL | Status: DC
  Administered 2023-04-09 – 2023-04-13 (×9): 237 mL via ORAL

## 2023-04-09 MED ORDER — POLYVINYL ALCOHOL 1.4 % OP SOLN
1.0000 [drp] | Freq: Two times a day (BID) | OPHTHALMIC | Status: DC
  Administered 2023-04-09 – 2023-04-13 (×9): 1 [drp] via OPHTHALMIC
  Filled 2023-04-09: qty 15

## 2023-04-09 MED ORDER — CEFADROXIL 500 MG PO CAPS
1000.0000 mg | ORAL_CAPSULE | Freq: Two times a day (BID) | ORAL | Status: DC
  Administered 2023-04-09 – 2023-04-10 (×3): 1000 mg via ORAL
  Filled 2023-04-09 (×5): qty 2

## 2023-04-09 MED ORDER — SM SALINE SOLUTION SOLN: 10.0000 mL | Freq: Three times a day (TID) | Status: DC

## 2023-04-09 NOTE — Progress Notes (Signed)
   04/09/23 1558  Spiritual Encounters  Type of Visit Initial  Care provided to: Pt and family  Conversation partners present during encounter Nurse  Referral source Family  Reason for visit Routine spiritual support   Reason for Visit: Chaplain responding to page from [facility] Office that family member had called and asked for chaplain visit for Pt and Pt's daughter.  Time of Visit: 30 Minutes  Description of Visit: Chaplain arrived in room to find Pt lying in bed and daughter at the bedside  Chaplain immediately remembered the daughter as I sat with her a year ago when her husband was brought in when his insulin pump failed. Daughter remembered chaplain as well.   Chaplain spoke briefly with Pt who is suffering with advanced dementia.  Chaplain spent time processing with Pt's daughter, facilitating storytelling, and practicing reflective listening.   Before leaving, chaplain provided the ritual of prayer when asked.  Plan of Care: Chaplain will continue to follow up with this Pt on regular rounds until expected discharge Friday.  Chaplain services remain available by Spiritual Consult or for emergent cases, paging [phone]  Chaplain Raelene Bott, MDiv [email or web address] [phone]

## 2023-04-09 NOTE — Evaluation (Signed)
 Clinical/Bedside Swallow Evaluation Patient Details  Name: [name] MRN: [id number] Date of Birth: 10/20/1924  Today's Date: 04/09/2023 Time: SLP Start Time (ACUTE ONLY): 1000 SLP Stop Time (ACUTE ONLY): 1028 SLP Time Calculation (min) (ACUTE ONLY): 28 min  Past Medical History:  Past Medical History:  Diagnosis Date   Arthritis 04/13/2009   Right knee   Carotid artery occlusion 07/15/2003   Identified at the time of stroke   COVID-19 virus infection 07/09/2020   Admitted with weakness, found to have UTI and COVID-19.  Treated with 3 days of remdesivir. ->  Remains extremely debilitated   Depression    Depression    GERD (gastroesophageal reflux disease)    Hyperlipidemia    Hypertension    Stroke (HCC) 07/15/2003   Mini   Thoracic kyphosis    Past Surgical History:  Past Surgical History:  Procedure Laterality Date   CATARACT EXTRACTION W/ INTRAOCULAR LENS  IMPLANT, BILATERAL     TRANSTHORACIC ECHOCARDIOGRAM  07/10/2020   In setting of UTI and COVID-19 infection, mildly elevated <100 hs Troponin (Demand ischemia) - Normal EF 60 to 65%.  No R WMA mild LVH.  GRII DD.  Moderately elevated PAP.  Moderate LA dilation.  Mild to moderate MR.  Moderate TR.  (very) mild aortic stenosis.  (Mean gradient 8 mmHg).   HPI:  [name] is a 89 y.o. female who [facility] regarding perioral cyanosis and diarrhea.  Apparently GI illness going around nursing facility.  On arrival, patient was placed on 3 L nasal cannula and temperature was noted to be 102.0 F.  Concern for biliary obstuction. Family considering ECRP but also hoping to avoid procedures. Per MD note Pt drinks vanilla boost prior to admission.Pt has been on a "minced meat" diet but has previously been on a puree diet due to mouth cancer. Family believes that the patient eats more food when on a puree diet. GI cleared pt to start oral diet. Pt  with past medical history significant for squamous cell carcinoma of the oral cavity on hospice, dementia, severe right knee osteoarthritis, HTN, HLD, depression, history of TIA    Assessment / Plan / Recommendation  Clinical Impression  Pt seen with daughter at bedside. Daughter confirms pt has been tolerating thin liquids and puree well, but has recently had some pain with trying to masticate minced foods. Her cancer is in her hard palate and her teeth were extracted. There are visible bumpy structural deformities to the anterior portion of the hard palate. 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 Daughter denies any signs of food or drink coming from the pts nose. She is moderately dysarthric with nasal resonance so decreased intraoral pressures are suspected. Daughter reports they have no intention of treatment and pt has survived long beyond the initial prognosis. She enjoys eating and drinking and demonstrates no struggle with purees or thin liquids today. She is able to take pills with water from a straw. Will resume diet and sign off. SLP Visit Diagnosis: Dysphagia, unspecified (R13.10)    Aspiration Risk  Mild aspiration risk    Diet Recommendation Dysphagia 1 (Puree);Thin liquid    Liquid Administration via: Cup;Straw Medication Administration: Whole meds with liquid Supervision: Patient able to self feed Compensations: Slow rate;Small sips/bites Postural Changes: Seated upright at 90 degrees    Other  Recommendations Oral  Care Recommendations: Oral care BID    Recommendations for follow up therapy are one component of a multi-disciplinary discharge planning process, led by the attending physician.  Recommendations may be updated based on patient status, additional functional criteria and insurance authorization.  Follow up Recommendations No SLP follow up      Assistance Recommended at Discharge    Functional  Status Assessment    Frequency and Duration            Prognosis        Swallow Study   General HPI: [name] is a 89 y.o. female who [facility] regarding perioral cyanosis and diarrhea.  Apparently GI illness going around nursing facility.  On arrival, patient was placed on 3 L nasal cannula and temperature was noted to be 102.0 F.  Concern for biliary obstuction. Family considering ECRP but also hoping to avoid procedures. Per MD note Pt drinks vanilla boost prior to admission.Pt has been on a "minced meat" diet but has previously been on a puree diet due to mouth cancer. Family believes that the patient eats more food when on a puree diet. GI cleared pt to start oral diet. 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 on hospice, dementia, severe right knee osteoarthritis, HTN, HLD, depression, history of TIA Type of Study: Bedside Swallow Evaluation Previous Swallow Assessment: none in chart, likely at outside facility Diet Prior to this Study: Full liquid diet Temperature Spikes Noted: No Respiratory Status: Room air History of Recent Intubation: No Behavior/Cognition: Alert;Cooperative;Pleasant mood Oral Cavity Assessment: Lesions Oral Care Completed by SLP: No Oral Cavity - Dentition: Edentulous Vision: Functional for self-feeding Self-Feeding Abilities: Able to feed self Patient Positioning: Upright in bed Baseline Vocal Quality: Other (comment) (Hypernasal resonance,) Volitional Cough: Strong Volitional Swallow: Able to elicit    Oral/Motor/Sensory Function Overall Oral Motor/Sensory Function: Within functional limits   Ice Chips Ice chips: Not tested   Thin Liquid Thin Liquid: Within functional limits Presentation: Straw    Nectar Thick Nectar Thick Liquid: Not tested   Honey Thick Honey Thick Liquid: Not tested   Puree Puree: Within  functional limits   Solid     Solid: Not tested      Chimene Salo, Riley Nearing 04/09/2023,10:39 AM

## 2023-04-09 NOTE — Progress Notes (Signed)
 [facility] Patient Visit   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 She was admitted 04/06/2023 with diagnosis of  Cholangitis. [facility] was notified by her facility of being sent to ED. Per Dr. Nolon Rod Monguilod, hospice physician, this is a related hospital admission.    Visited with patient with daughter at bedside. Patient is more alert today and without complaint. Discussed plan of care with daughter, including plan to continue IV antibiotics and conservative approach, avoiding ERCP.    Patient is GIP appropriate IV antibiotics and further evaluation.    Vital Signs: 97.6/75/18   153/79   O2 95% on RA   I&O: not documented   Abnormal labs: Alk Phos 342, Alb 2.6, AST 73, ALT 98, T. Bili 1.6, GFRe 53, HGB 10.5.   Diagnostics: MRCP: IMPRESSION: 1. Mild intra and extrahepatic biliary dilatation without definite evidence of choledocholithiasis. Evaluation of the common bile duct limited by motion artifact. Consider ERCP if clinically warranted. 2. Distended gallbladder with internal sludge, but no significant wall thickening or surrounding inflammation. 3. No other acute abdominal findings. 4. Small bilateral pleural effusions and bibasilar pulmonary atelectasis. 5. Severe aortic and branch vessel atherosclerosis.  IV/PRN Meds: Rocephin 2G IV x daily     Assessment and Plan from note 3.25.25  Sepsis, POA E. coli bacteremia Lactic acidosis: resolved Patient presenting with a temperature of 101.9, elevated WBC count of 18.5 with lactic acid 4.5.  Noted elevated LFTs, total bilirubin.  Blood cultures x 2 positive for E. coli.  Denies urinary symptoms.  Suspect biliary/GI source. -- Lactic acid 4.5>3.9>1.0 -- WBC 18.5>15.4>10.5 -- Blood Cultures x 2: + GNRs, BCID + Ecoli; further notification/susceptibilities pending -- GI PCR panel: Pending -- Ceftriaxone 2 g IV every 24  hours -- Enteric precautions -- CBC daily   Transaminitis Concern for biliary obstruction Acute hepatitis panel negative.  Right upper quadrant ultrasound with gallbladder sludge and small stones.  HIDA scan with persistent radiotracer activity throughout the liver after 120 minutes without biliary or bowel activities concerning for hepatic site dysfunction versus common bile duct obstruction. --General Surgery following, appreciate assistance -- AST [phone] -- ALT [phone] -- Tbili 1.7>3.2>3.1 -- Avoid hepatotoxins -- Full liquid diet -- MRCP: Pending -- Will consult GI after MRCP results as patient would likely benefit from ERCP with sphincterotomy given poor operative candidate but that per general surgery -- CMP daily   Hyponatremia -- Na 132>133>135 -- LR at 100 mL/h -- Repeat BMP in a.m.    Discharge Planning: Ongoing   Family Contact: spoke with daughter at bedside   IDT: Updated   Goals of Care: DNR   Glenna Fellows BSN, RN, [facility] hospital liaison [phone]

## 2023-04-09 NOTE — TOC Progression Note (Signed)
 Transition of Care [facility]) - Progression Note    Patient Details  Name: [name] MRN: [id number] Date of Birth: [birth year]  [facility] CM/SW Contact  Eduard Roux, [place] Phone Number: 04/09/2023, 4:23 PM  Clinical Narrative:     Patient is LTC resident  [facility] ([facility] TOC will continue to follow for medical readiness-   Antony Blackbird, MSW, LCSW Clinical Social Worker    Expected Discharge Plan: Skilled Nursing Facility Barriers to Discharge: Continued Medical Work up  Expected Discharge Plan and Services                                               Social Determinants of Health (SDOH) Interventions SDOH Screenings   Food Insecurity: No Food Insecurity (04/06/2023)  Housing: Low Risk  (04/06/2023)  Transportation Needs: No Transportation Needs (04/06/2023)  Utilities: Not At Risk (04/06/2023)  Social Connections: Unknown (04/06/2023)  Tobacco Use: Medium Risk (04/07/2023)    Readmission Risk Interventions     No data to display

## 2023-04-09 NOTE — Progress Notes (Signed)
 TRH ROUNDING NOTE [name] VHQ:[id number]  DOB: 1924/01/26  DOA: 04/06/2023  PCP: Ellan Lambert, NP  04/09/2023,6:46 AM  LOS: 3 days    Code Status: DNR   from: [facility] current Dispo: Unclear   89 year old white female ?  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 Melinda Crutch at [facility] a candidate for chemo immunotherapy Prior TIA HTN HLD Arthritis right knee 3/23 admission from skilled facility with cyanosis Tmax 102 diarrhea heart rate 130 BP 01/31/1983 Sodium 132 BUN/creatinine 25/1.6 WBC 18 hemoglobin 11 platelet 354 RVP negative hepatitis serology negative  blood culture grew E. coli [place] ABD gallbladder sludge small stone, HIDA persistent radiotracer activity throughout liver 3/24 General Surgery consulted, GI consulted and felt conservative approach better-diet resumed   Plan  Sepsis on admission with lactic acidosis on admission Bili obstruction transaminitis Patient and family have elected to not undergo ERCP/operative management unless significant changes to condition in terms of worsening or significant elevation of LFTs LFTs continues to trend down, continue ceftriaxone 2 g q. 24 Would treat for 10 days Blood culture growing E. coli so we will de-escalate in the next 24 hours to  Prior TIA, dementia No confusion at this time-continue supportive therapy only Plan-0.25 at bedtime for agitation Anxiety Can continue aspirin 81  Squamous cell CA not surgical candidate She does have squamous cell CA and a fistula-no change in management-is on hospice and will go home on the same She has been cleared for dysphagia 1 diet  Depression Klonopin as above,-resume Paxil 30 daily  HTN Continues metoprolol 25 daily--would hold Lasix 20 daily for now  Arthritis  Discussed with daughter at the bedside   DVT prophylaxis: SCD  Status is: Inpatient Remains inpatient appropriate because:   Requires de-escalation  of antibiotics Likely will discharge with hospice at discharge   Subjective: Well no distress no fever no chills no rigors Ate today Seems more alert compared to prior  Objective + exam Vitals:   04/08/23 0837 04/08/23 1837 04/08/23 1954 04/09/23 0543  BP: 134/68 (!) 140/63 (!) 153/71 (!) 152/89  Pulse: 79 62 75 79  Resp:  17 16 18   Temp:  98.6 F (37 C) 97.9 F (36.6 C) 98.1 F (36.7 C)  TempSrc:  Oral Oral Oral  SpO2:  (!) 89% 92% 93%  Weight:      Height:       Filed Weights   04/06/23 1800  Weight: 66.7 kg    Examination: Eomi ncat no focal deficit Cta b no added sound S1 s 2no m/r/g Abd soft nt nd no rebound no RUQ tender No LE edema  Data Reviewed: reviewed   CBC    Component Value Date/Time   WBC 10.5 04/08/2023 0923   RBC 2.94 (L) 04/08/2023 0923   HGB 9.6 (L) 04/08/2023 0923   HGB 11.4 (L) 04/24/2021 1343   HCT 29.5 (L) 04/08/2023 0923   PLT 259 04/08/2023 0923   PLT 230 04/24/2021 1343   MCV 100.3 (H) 04/08/2023 0923   MCH 32.7 04/08/2023 0923   MCHC 32.5 04/08/2023 0923   RDW 11.9 04/08/2023 0923   LYMPHSABS 0.6 (L) 04/06/2023 1715   MONOABS 0.4 04/06/2023 1715   EOSABS 0.0 04/06/2023 1715   BASOSABS 0.0 04/06/2023 1715      Latest Ref Rng & Units 04/08/2023    9:23 AM 04/07/2023   11:19 AM 04/06/2023    5:15 PM  CMP  Glucose  70 - 99 mg/dL 80  95  213   BUN 8 - 23 mg/dL 17  25  25    Creatinine 0.44 - 1.00 mg/dL 0.86  5.78  4.69   Sodium 135 - 145 mmol/L 135  133  132   Potassium 3.5 - 5.1 mmol/L 3.8  3.7  4.3   Chloride 98 - 111 mmol/L 106  104  99   CO2 22 - 32 mmol/L 20  20  19    Calcium 8.9 - 10.3 mg/dL 9.0  8.9  9.6   Total Protein 6.5 - 8.1 g/dL 6.9  6.6  8.4   Total Bilirubin 0.0 - 1.2 mg/dL 3.1  3.2  1.7   Alkaline Phos 38 - 126 U/L 324  346  454   AST 15 - 41 U/L 157  403  918   ALT 0 - 44 U/L 145  224  291     Scheduled Meds:  aspirin EC  81 mg Oral Daily   carboxymethylcellulose  1 drop Both Eyes BID   cefadroxil  1,000  mg Oral BID   clonazepam  0.25 mg Oral QHS   diclofenac Sodium  4 g Topical TID AC & HS   feeding supplement  237 mL Oral TID WC   metoprolol tartrate  25 mg Oral Daily   pantoprazole  40 mg Oral Q0600   PARoxetine  30 mg Oral Daily   SM Saline Solution  10 mL Oral TID PC   Vagisil  1 Application Topical QHS   Continuous Infusions:  cefTRIAXone (ROCEPHIN)  IV 2 g (04/08/23 1334)    Time  45  Rhetta Mura, MD  [facility]

## 2023-04-09 NOTE — Progress Notes (Addendum)
 Patient ID: [name], female   DOB: Mar 02, 1924, 89 y.o.   MRN: [id number]     Attending physician's note   I have taken a history, reviewed the chart, and examined the patient. I performed a substantive portion of this encounter, including complete performance of at least one of the key components, in conjunction with the APP. I agree with the APP's note, impression, and recommendations with my edits.   Liver enzymes downtrending and T. bili now 1.6.  WBC normal and H/H stable.  Improved mentation compared with yesterday.  - Continue IV ABX - Continue daily liver enzymes - Discussed with family at bedside again today.  Plan for continued medical management.  No plan for ERCP at this juncture and this 89 year old - Advancing diet - PT consult  Kamau Weatherall, DO, FACG [phone] office          Progress Note   Subjective   Day # 3 CC; gram-negative bacteremia/E. Coli, elevated LFTs  MRI/MRCP showed distended gallbladder with internal sludge no significant gallbladder wall thickening, mild intra and extrahepatic biliary dilation without definite choledocholithiasis.  Patient's daughter is at bedside, had spent the night, says that her mother slept all night.  She is more alert today irritated because she is stuck in bed.  Patient denies any abdominal pain says she would like to eat (had been on a pured diet previously)  Labs today-WBC 10.1/hemoglobin 10.5/hematocrit 32.4 Potassium 3.9/BUN 15/creatinine 0.98 T. bili 1.6/alk phos 342/AST 73/ALT 98 improved INR 1.0    Objective   Vital signs in last 24 hours: Temp:  [97.6 F (36.4 C)-98.6 F (37 C)] 97.6 F (36.4 C) (03/26 0741) Pulse Rate:  [62-79] 75 (03/26 1004) Resp:  [16-18] 18 (03/26 0741) BP: (140-153)/(63-89) 153/79 (03/26 1004) SpO2:  [89 %-95 %] 95 % (03/26 0741) Last BM Date : 04/08/23 General: Very elderly   white female in NAD Heart:  Regular rate and rhythm; no murmurs Lungs: Respirations  even and unlabored, lungs CTA bilaterally Abdomen:  Soft, obese, nontender and nondistended. Normal bowel sounds. Extremities:  Without edema. Neurologic:  Alert and oriented x 1,  grossly normal neurologically. Psych:  Cooperative. Normal mood and affect.  Intake/Output from previous day: No intake/output data recorded. Intake/Output this shift: Total I/O In: -  Out: 300 [Urine:300]  Lab Results: Recent Labs    04/07/23 1119 04/08/23 0923 04/09/23 0656  WBC 15.4* 10.5 10.1  HGB 8.8* 9.6* 10.5*  HCT 27.2* 29.5* 32.4*  PLT 251 259 296   BMET Recent Labs    04/07/23 1119 04/08/23 0923 04/09/23 0656  NA 133* 135 138  K 3.7 3.8 3.9  CL 104 106 106  CO2 20* 20* 22  GLUCOSE 95 80 89  BUN 25* 17 15  CREATININE 1.41* 1.02* 0.98  CALCIUM 8.9 9.0 9.2   LFT Recent Labs    04/09/23 0656  PROT 6.9  ALBUMIN 2.6*  AST 73*  ALT 98*  ALKPHOS 342*  BILITOT 1.6*   PT/INR Recent Labs    04/09/23 0656  LABPROT 13.5  INR 1.0    Studies/Results: MR ABDOMEN MRCP W WO CONTAST Result Date: 04/08/2023 CLINICAL DATA:  Cholelithiasis. Concern for choledocholithiasis and biliary obstruction. No biliary excretion on recent hepatobiliary scan. EXAM: MRI ABDOMEN WITHOUT AND WITH CONTRAST (INCLUDING MRCP) TECHNIQUE: Multiplanar multisequence MR imaging of the abdomen was performed both before and after the administration of intravenous contrast. Heavily T2-weighted images of the biliary and pancreatic ducts were obtained, and three-dimensional  MRCP images were rendered by post processing. CONTRAST:  6mL GADAVIST GADOBUTROL 1 MMOL/ML IV SOLN COMPARISON:  Nuclear medicine hepatobiliary scan 04/07/2023. Abdominal ultrasound and abdominal CT 04/06/2023. FINDINGS: Technical note: Despite efforts by the technologist and patient, mild to moderate motion artifact is present on today's exam and could not be eliminated. This reduces exam sensitivity and specificity. Lower chest: Images through the  lower chest demonstrate asymmetric elevation the right hemidiaphragm with small bilateral pleural effusions and bibasilar pulmonary atelectasis. Hepatobiliary: The liver has a non cirrhotic morphology. There is borderline signal loss on the gradient echo opposed phase images consistent with possible mild steatosis. No focal liver lesion or abnormal enhancement identified following contrast. As on recent prior imaging, the gallbladder is distended with internal sludge, but no significant wall thickening or surrounding inflammation. There is mild intra and extrahepatic biliary dilatation. The common hepatic duct measures 10 mm in diameter. No definite evidence of choledocholithiasis, although evaluation limited by motion. Pancreas: Mild generalized atrophy. No focal abnormality or ductal dilatation identified. Spleen: Normal in size without focal abnormality. Adrenals/Urinary Tract: Both adrenal glands appear normal. No evidence of renal mass or hydronephrosis. Small renal cysts are noted for which no specific follow-up imaging is recommended. Stomach/Bowel: The stomach appears unremarkable for its degree of distension. No evidence of bowel wall thickening, distention or surrounding inflammatory change. Vascular/Lymphatic: No enlarged abdominal lymph nodes are identified. There is severe aortic and branch vessel atherosclerosis with associated intimal irregularity. No focal aneurysm or large vessel occlusion identified. Other: Mild dependent subcutaneous edema in the back. No evidence of significant ascites or focal extraluminal fluid collection. Musculoskeletal: No acute or significant osseous findings. Multilevel spondylosis associated with a convex right thoracolumbar scoliosis. IMPRESSION: 1. Mild intra and extrahepatic biliary dilatation without definite evidence of choledocholithiasis. Evaluation of the common bile duct limited by motion artifact. Consider ERCP if clinically warranted. 2. Distended gallbladder  with internal sludge, but no significant wall thickening or surrounding inflammation. 3. No other acute abdominal findings. 4. Small bilateral pleural effusions and bibasilar pulmonary atelectasis. 5. Severe aortic and branch vessel atherosclerosis. Electronically Signed   By: Carey Bullocks M.D.   On: 04/08/2023 14:26   MR 3D Recon At Scanner Result Date: 04/08/2023 CLINICAL DATA:  Cholelithiasis. Concern for choledocholithiasis and biliary obstruction. No biliary excretion on recent hepatobiliary scan. EXAM: MRI ABDOMEN WITHOUT AND WITH CONTRAST (INCLUDING MRCP) TECHNIQUE: Multiplanar multisequence MR imaging of the abdomen was performed both before and after the administration of intravenous contrast. Heavily T2-weighted images of the biliary and pancreatic ducts were obtained, and three-dimensional MRCP images were rendered by post processing. CONTRAST:  6mL GADAVIST GADOBUTROL 1 MMOL/ML IV SOLN COMPARISON:  Nuclear medicine hepatobiliary scan 04/07/2023. Abdominal ultrasound and abdominal CT 04/06/2023. FINDINGS: Technical note: Despite efforts by the technologist and patient, mild to moderate motion artifact is present on today's exam and could not be eliminated. This reduces exam sensitivity and specificity. Lower chest: Images through the lower chest demonstrate asymmetric elevation the right hemidiaphragm with small bilateral pleural effusions and bibasilar pulmonary atelectasis. Hepatobiliary: The liver has a non cirrhotic morphology. There is borderline signal loss on the gradient echo opposed phase images consistent with possible mild steatosis. No focal liver lesion or abnormal enhancement identified following contrast. As on recent prior imaging, the gallbladder is distended with internal sludge, but no significant wall thickening or surrounding inflammation. There is mild intra and extrahepatic biliary dilatation. The common hepatic duct measures 10 mm in diameter. No definite evidence of  choledocholithiasis,  although evaluation limited by motion. Pancreas: Mild generalized atrophy. No focal abnormality or ductal dilatation identified. Spleen: Normal in size without focal abnormality. Adrenals/Urinary Tract: Both adrenal glands appear normal. No evidence of renal mass or hydronephrosis. Small renal cysts are noted for which no specific follow-up imaging is recommended. Stomach/Bowel: The stomach appears unremarkable for its degree of distension. No evidence of bowel wall thickening, distention or surrounding inflammatory change. Vascular/Lymphatic: No enlarged abdominal lymph nodes are identified. There is severe aortic and branch vessel atherosclerosis with associated intimal irregularity. No focal aneurysm or large vessel occlusion identified. Other: Mild dependent subcutaneous edema in the back. No evidence of significant ascites or focal extraluminal fluid collection. Musculoskeletal: No acute or significant osseous findings. Multilevel spondylosis associated with a convex right thoracolumbar scoliosis. IMPRESSION: 1. Mild intra and extrahepatic biliary dilatation without definite evidence of choledocholithiasis. Evaluation of the common bile duct limited by motion artifact. Consider ERCP if clinically warranted. 2. Distended gallbladder with internal sludge, but no significant wall thickening or surrounding inflammation. 3. No other acute abdominal findings. 4. Small bilateral pleural effusions and bibasilar pulmonary atelectasis. 5. Severe aortic and branch vessel atherosclerosis. Electronically Signed   By: Carey Bullocks M.D.   On: 04/08/2023 14:26   ECHOCARDIOGRAM COMPLETE Result Date: 04/07/2023    ECHOCARDIOGRAM REPORT   Patient Name:   [name] Date of Exam: 04/07/2023 Medical Rec #:  [id number]        Height:       66.0 in Accession #:    [id number]       Weight:       147.0 lb Date of Birth:  1924/05/02       BSA:          1.755 m Patient Age:    89 years         BP:            122/65 mmHg Patient Gender: F                HR:           58 bpm. Exam Location:  Inpatient Procedure: 2D Echo, Cardiac Doppler and Color Doppler (Both Spectral and Color            Flow Doppler were utilized during procedure). Indications:    I50.40* Unspecified combined systolic (congestive) and diastolic                 (congestive) heart failure  History:        Patient has prior history of Echocardiogram examinations, most                 recent 07/10/2020. Signs/Symptoms:Edema, Altered Mental Status                 and Alzheimer's; Risk Factors:Hypertension and Dyslipidemia.                 Cancer.  Sonographer:    Sheralyn Boatman RDCS Referring Phys: [id number] ERIC J [place] IMPRESSIONS  1. Left ventricular ejection fraction, by estimation, is 60 to 65%. The left ventricle has normal function. The left ventricle has no regional wall motion abnormalities. There is mild concentric left ventricular hypertrophy. Left ventricular diastolic parameters are consistent with Grade II diastolic dysfunction (pseudonormalization).  2. Right ventricular systolic function is normal. The right ventricular size is normal. There is mildly elevated pulmonary artery systolic pressure. The estimated right ventricular systolic pressure is 41.1 mmHg.  3. Left atrial size was  severely dilated.  4. Right atrial size was mildly dilated.  5. The mitral valve is normal in structure. Moderate mitral valve regurgitation. No evidence of mitral stenosis. Moderate mitral annular calcification.  6. Tricuspid valve regurgitation is moderate.  7. The aortic valve is tricuspid. There is mild calcification of the aortic valve. Aortic valve regurgitation is mild. Aortic valve sclerosis/calcification is present, without any evidence of aortic stenosis.  8. Aortic dilatation noted. There is borderline dilatation of the ascending aorta, measuring 38 mm. FINDINGS  Left Ventricle: Left ventricular ejection fraction, by estimation, is 60 to 65%. The left  ventricle has normal function. The left ventricle has no regional wall motion abnormalities. The left ventricular internal cavity size was normal in size. There is  mild concentric left ventricular hypertrophy. Left ventricular diastolic parameters are consistent with Grade II diastolic dysfunction (pseudonormalization). Right Ventricle: The right ventricular size is normal. No increase in right ventricular wall thickness. Right ventricular systolic function is normal. There is mildly elevated pulmonary artery systolic pressure. The tricuspid regurgitant velocity is 2.79  m/s, and with an assumed right atrial pressure of 10 mmHg, the estimated right ventricular systolic pressure is 41.1 mmHg. Left Atrium: Left atrial size was severely dilated. Right Atrium: Right atrial size was mildly dilated. Pericardium: There is no evidence of pericardial effusion. Mitral Valve: The mitral valve is normal in structure. Moderate mitral annular calcification. Moderate mitral valve regurgitation, with centrally-directed jet. No evidence of mitral valve stenosis. MV peak gradient, 7.6 mmHg. The mean mitral valve gradient is 3.0 mmHg. Tricuspid Valve: The tricuspid valve is normal in structure. Tricuspid valve regurgitation is moderate . No evidence of tricuspid stenosis. Aortic Valve: The aortic valve is tricuspid. There is mild calcification of the aortic valve. Aortic valve regurgitation is mild. Aortic valve sclerosis/calcification is present, without any evidence of aortic stenosis. Pulmonic Valve: The pulmonic valve was normal in structure. Pulmonic valve regurgitation is not visualized. No evidence of pulmonic stenosis. Aorta: Aortic dilatation noted. There is borderline dilatation of the ascending aorta, measuring 38 mm. IAS/Shunts: No atrial level shunt detected by color flow Doppler.  LEFT VENTRICLE PLAX 2D LVIDd:         5.10 cm     Diastology LVIDs:         3.10 cm     LV e' medial:    5.66 cm/s LV PW:         1.20 cm      LV E/e' medial:  23.1 LV IVS:        1.10 cm     LV e' lateral:   10.40 cm/s LVOT diam:     2.30 cm     LV E/e' lateral: 12.6 LV SV:         107 LV SV Index:   61 LVOT Area:     4.15 cm  LV Volumes (MOD) LV vol d, MOD A2C: 67.8 ml LV vol d, MOD A4C: 70.6 ml LV vol s, MOD A2C: 26.0 ml LV vol s, MOD A4C: 25.6 ml LV SV MOD A2C:     41.8 ml LV SV MOD A4C:     70.6 ml LV SV MOD BP:      45.2 ml RIGHT VENTRICLE             IVC RV S prime:     12.20 cm/s  IVC diam: 2.20 cm TAPSE (M-mode): 1.4 cm LEFT ATRIUM           Index  RIGHT ATRIUM           Index LA diam:      3.40 cm 1.94 cm/m   RA Area:     13.40 cm LA Vol (A2C): 24.9 ml 14.19 ml/m  RA Volume:   35.40 ml  20.18 ml/m LA Vol (A4C): 62.1 ml 35.39 ml/m  AORTIC VALVE LVOT Vmax:   132.00 cm/s LVOT Vmean:  86.500 cm/s LVOT VTI:    0.257 m  AORTA Ao Root diam: 3.10 cm Ao Asc diam:  3.80 cm MITRAL VALVE                TRICUSPID VALVE MV Area (PHT): 4.30 cm     TR Peak grad:   31.1 mmHg MV Area VTI:   2.47 cm     TR Vmax:        279.00 cm/s MV Peak grad:  7.6 mmHg MV Mean grad:  3.0 mmHg     SHUNTS MV Vmax:       1.38 m/s     Systemic VTI:  0.26 m MV Vmean:      82.0 cm/s    Systemic Diam: 2.30 cm MV Decel Time: 177 msec MV E velocity: 131.00 cm/s MV A velocity: 125.50 cm/s MV E/A ratio:  1.04 Arvilla Meres MD Electronically signed by Arvilla Meres MD Signature Date/Time: 04/07/2023/1:55:02 PM    Final        Assessment / Plan:    #5 89 year old nursing home resident admitted on 04/06/2023 after she developed fever to 102, and had some evidence of peripheral cyanosis. Tachycardic and febrile to 101.9 on admission  Workup is most consistent with cholangitis secondary to past sludge/stone. MRCP did show mildly dilated intra and extrahepatic ducts but no definite choledocholithiasis, there was some suggestion of sludge in the distal duct.  No cholecystitis  She is improving daily with IV antibiotics, LFTs trending down.  Long discussion with her  family yesterday who were all in agreement with conservative management as long as she continues to improve.  #2 acute kidney injury resolving 3.  Prior history of small CVA 4.  Squamous cell CA of the mouth 5.  Dementia 6.  History of hypertension  Plan; will advance to pured diet today Continue Rocephin Continue daily labs Discussed with family, anticipate at least 5 days of IV antibiotics, and as long as she has continued trend toward normalization of LFTs, we will continue conservative management and hope to get her back to her nursing home this weekend.  Daughter is concerned about leaving her mom alone asking if she could have a sitter as she is concerned she will try to get out of bed etc. Will get PT involved, allow her to be up to chair etc.     Principal Problem:   Cholangitis Active Problems:   Hospice care patient   Cancer of oral cavity (HCC)   Bacteremia   Elevated bilirubin     LOS: 3 days   Amy EsterwoodPA-C  04/09/2023, 12:13 PM

## 2023-04-09 NOTE — Plan of Care (Signed)

## 2023-04-09 NOTE — Progress Notes (Addendum)
 Subjective: Patient HOH.  She denies abdominal pain or nausea. She is hungry and asking for her breakfast. She drinks vanilla boost prior to admission.   Her daughter at bedside says the patient is on a "minced meat" diet but has previously been on a puree diet due to mouth cancer. She tells me that she thinks that the patient eats more food when on a puree diet.  ROS: See above, otherwise other systems negative  Objective: Vital signs in last 24 hours: Temp:  [97.6 F (36.4 C)-98.6 F (37 C)] 97.6 F (36.4 C) (03/26 0741) Pulse Rate:  [62-79] 75 (03/26 0741) Resp:  [16-18] 18 (03/26 0741) BP: (134-153)/(63-89) 153/79 (03/26 0741) SpO2:  [89 %-95 %] 95 % (03/26 0741) Last BM Date : 04/08/23  Intake/Output from previous day: No intake/output data recorded. Intake/Output this shift: No intake/output data recorded.  PE: Abd: soft, NT, ND, +BS  Lab Results:  Recent Labs    04/08/23 0923 04/09/23 0656  WBC 10.5 10.1  HGB 9.6* 10.5*  HCT 29.5* 32.4*  PLT 259 296   BMET Recent Labs    04/08/23 0923 04/09/23 0656  NA 135 138  K 3.8 3.9  CL 106 106  CO2 20* 22  GLUCOSE 80 89  BUN 17 15  CREATININE 1.02* 0.98  CALCIUM 9.0 9.2   PT/INR Recent Labs    04/09/23 0656  LABPROT 13.5  INR 1.0   CMP     Component Value Date/Time   NA 138 04/09/2023 0656   K 3.9 04/09/2023 0656   CL 106 04/09/2023 0656   CO2 22 04/09/2023 0656   GLUCOSE 89 04/09/2023 0656   BUN 15 04/09/2023 0656   CREATININE 0.98 04/09/2023 0656   CREATININE 1.19 (H) 04/24/2021 1343   CALCIUM 9.2 04/09/2023 0656   PROT 6.9 04/09/2023 0656   ALBUMIN 2.6 (L) 04/09/2023 0656   AST 73 (H) 04/09/2023 0656   AST 17 04/24/2021 1343   ALT 98 (H) 04/09/2023 0656   ALT 10 04/24/2021 1343   ALKPHOS 342 (H) 04/09/2023 0656   BILITOT 1.6 (H) 04/09/2023 0656   BILITOT 0.3 04/24/2021 1343   GFRNONAA 53 (L) 04/09/2023 0656   GFRNONAA 42 (L) 04/24/2021 1343   GFRAA 43 (L) 04/27/2019 1250    Lipase  No results found for: "LIPASE"     Studies/Results: MR ABDOMEN MRCP W WO CONTAST Result Date: 04/08/2023 CLINICAL DATA:  Cholelithiasis. Concern for choledocholithiasis and biliary obstruction. No biliary excretion on recent hepatobiliary scan. EXAM: MRI ABDOMEN WITHOUT AND WITH CONTRAST (INCLUDING MRCP) TECHNIQUE: Multiplanar multisequence MR imaging of the abdomen was performed both before and after the administration of intravenous contrast. Heavily T2-weighted images of the biliary and pancreatic ducts were obtained, and three-dimensional MRCP images were rendered by post processing. CONTRAST:  6mL GADAVIST GADOBUTROL 1 MMOL/ML IV SOLN COMPARISON:  Nuclear medicine hepatobiliary scan 04/07/2023. Abdominal ultrasound and abdominal CT 04/06/2023. FINDINGS: Technical note: Despite efforts by the technologist and patient, mild to moderate motion artifact is present on today's exam and could not be eliminated. This reduces exam sensitivity and specificity. Lower chest: Images through the lower chest demonstrate asymmetric elevation the right hemidiaphragm with small bilateral pleural effusions and bibasilar pulmonary atelectasis. Hepatobiliary: The liver has a non cirrhotic morphology. There is borderline signal loss on the gradient echo opposed phase images consistent with possible mild steatosis. No focal liver lesion or abnormal enhancement identified following contrast. As on recent prior imaging, the gallbladder  is distended with internal sludge, but no significant wall thickening or surrounding inflammation. There is mild intra and extrahepatic biliary dilatation. The common hepatic duct measures 10 mm in diameter. No definite evidence of choledocholithiasis, although evaluation limited by motion. Pancreas: Mild generalized atrophy. No focal abnormality or ductal dilatation identified. Spleen: Normal in size without focal abnormality. Adrenals/Urinary Tract: Both adrenal glands appear  normal. No evidence of renal mass or hydronephrosis. Small renal cysts are noted for which no specific follow-up imaging is recommended. Stomach/Bowel: The stomach appears unremarkable for its degree of distension. No evidence of bowel wall thickening, distention or surrounding inflammatory change. Vascular/Lymphatic: No enlarged abdominal lymph nodes are identified. There is severe aortic and branch vessel atherosclerosis with associated intimal irregularity. No focal aneurysm or large vessel occlusion identified. Other: Mild dependent subcutaneous edema in the back. No evidence of significant ascites or focal extraluminal fluid collection. Musculoskeletal: No acute or significant osseous findings. Multilevel spondylosis associated with a convex right thoracolumbar scoliosis. IMPRESSION: 1. Mild intra and extrahepatic biliary dilatation without definite evidence of choledocholithiasis. Evaluation of the common bile duct limited by motion artifact. Consider ERCP if clinically warranted. 2. Distended gallbladder with internal sludge, but no significant wall thickening or surrounding inflammation. 3. No other acute abdominal findings. 4. Small bilateral pleural effusions and bibasilar pulmonary atelectasis. 5. Severe aortic and branch vessel atherosclerosis. Electronically Signed   By: Carey Bullocks M.D.   On: 04/08/2023 14:26   MR 3D Recon At Scanner Result Date: 04/08/2023 CLINICAL DATA:  Cholelithiasis. Concern for choledocholithiasis and biliary obstruction. No biliary excretion on recent hepatobiliary scan. EXAM: MRI ABDOMEN WITHOUT AND WITH CONTRAST (INCLUDING MRCP) TECHNIQUE: Multiplanar multisequence MR imaging of the abdomen was performed both before and after the administration of intravenous contrast. Heavily T2-weighted images of the biliary and pancreatic ducts were obtained, and three-dimensional MRCP images were rendered by post processing. CONTRAST:  6mL GADAVIST GADOBUTROL 1 MMOL/ML IV SOLN  COMPARISON:  Nuclear medicine hepatobiliary scan 04/07/2023. Abdominal ultrasound and abdominal CT 04/06/2023. FINDINGS: Technical note: Despite efforts by the technologist and patient, mild to moderate motion artifact is present on today's exam and could not be eliminated. This reduces exam sensitivity and specificity. Lower chest: Images through the lower chest demonstrate asymmetric elevation the right hemidiaphragm with small bilateral pleural effusions and bibasilar pulmonary atelectasis. Hepatobiliary: The liver has a non cirrhotic morphology. There is borderline signal loss on the gradient echo opposed phase images consistent with possible mild steatosis. No focal liver lesion or abnormal enhancement identified following contrast. As on recent prior imaging, the gallbladder is distended with internal sludge, but no significant wall thickening or surrounding inflammation. There is mild intra and extrahepatic biliary dilatation. The common hepatic duct measures 10 mm in diameter. No definite evidence of choledocholithiasis, although evaluation limited by motion. Pancreas: Mild generalized atrophy. No focal abnormality or ductal dilatation identified. Spleen: Normal in size without focal abnormality. Adrenals/Urinary Tract: Both adrenal glands appear normal. No evidence of renal mass or hydronephrosis. Small renal cysts are noted for which no specific follow-up imaging is recommended. Stomach/Bowel: The stomach appears unremarkable for its degree of distension. No evidence of bowel wall thickening, distention or surrounding inflammatory change. Vascular/Lymphatic: No enlarged abdominal lymph nodes are identified. There is severe aortic and branch vessel atherosclerosis with associated intimal irregularity. No focal aneurysm or large vessel occlusion identified. Other: Mild dependent subcutaneous edema in the back. No evidence of significant ascites or focal extraluminal fluid collection. Musculoskeletal: No  acute or significant osseous findings.  Multilevel spondylosis associated with a convex right thoracolumbar scoliosis. IMPRESSION: 1. Mild intra and extrahepatic biliary dilatation without definite evidence of choledocholithiasis. Evaluation of the common bile duct limited by motion artifact. Consider ERCP if clinically warranted. 2. Distended gallbladder with internal sludge, but no significant wall thickening or surrounding inflammation. 3. No other acute abdominal findings. 4. Small bilateral pleural effusions and bibasilar pulmonary atelectasis. 5. Severe aortic and branch vessel atherosclerosis. Electronically Signed   By: Carey Bullocks M.D.   On: 04/08/2023 14:26   ECHOCARDIOGRAM COMPLETE Result Date: 04/07/2023    ECHOCARDIOGRAM REPORT   Patient Name:   YAJAYRA FELDT Date of Exam: 04/07/2023 Medical Rec #:  [id number]        Height:       66.0 in Accession #:    [id number]       Weight:       147.0 lb Date of Birth:  04-05-1924       BSA:          1.755 m Patient Age:    89 years         BP:           122/65 mmHg Patient Gender: F                HR:           58 bpm. Exam Location:  Inpatient Procedure: 2D Echo, Cardiac Doppler and Color Doppler (Both Spectral and Color            Flow Doppler were utilized during procedure). Indications:    I50.40* Unspecified combined systolic (congestive) and diastolic                 (congestive) heart failure  History:        Patient has prior history of Echocardiogram examinations, most                 recent 07/10/2020. Signs/Symptoms:Edema, Altered Mental Status                 and Alzheimer's; Risk Factors:Hypertension and Dyslipidemia.                 Cancer.  Sonographer:    Sheralyn Boatman RDCS Referring Phys: [id number] ERIC J [place] IMPRESSIONS  1. Left ventricular ejection fraction, by estimation, is 60 to 65%. The left ventricle has normal function. The left ventricle has no regional wall motion abnormalities. There is mild concentric left ventricular  hypertrophy. Left ventricular diastolic parameters are consistent with Grade II diastolic dysfunction (pseudonormalization).  2. Right ventricular systolic function is normal. The right ventricular size is normal. There is mildly elevated pulmonary artery systolic pressure. The estimated right ventricular systolic pressure is 41.1 mmHg.  3. Left atrial size was severely dilated.  4. Right atrial size was mildly dilated.  5. The mitral valve is normal in structure. Moderate mitral valve regurgitation. No evidence of mitral stenosis. Moderate mitral annular calcification.  6. Tricuspid valve regurgitation is moderate.  7. The aortic valve is tricuspid. There is mild calcification of the aortic valve. Aortic valve regurgitation is mild. Aortic valve sclerosis/calcification is present, without any evidence of aortic stenosis.  8. Aortic dilatation noted. There is borderline dilatation of the ascending aorta, measuring 38 mm. FINDINGS  Left Ventricle: Left ventricular ejection fraction, by estimation, is 60 to 65%. The left ventricle has normal function. The left ventricle has no regional wall motion abnormalities. The left ventricular internal cavity size was normal in  size. There is  mild concentric left ventricular hypertrophy. Left ventricular diastolic parameters are consistent with Grade II diastolic dysfunction (pseudonormalization). Right Ventricle: The right ventricular size is normal. No increase in right ventricular wall thickness. Right ventricular systolic function is normal. There is mildly elevated pulmonary artery systolic pressure. The tricuspid regurgitant velocity is 2.79  m/s, and with an assumed right atrial pressure of 10 mmHg, the estimated right ventricular systolic pressure is 41.1 mmHg. Left Atrium: Left atrial size was severely dilated. Right Atrium: Right atrial size was mildly dilated. Pericardium: There is no evidence of pericardial effusion. Mitral Valve: The mitral valve is normal in  structure. Moderate mitral annular calcification. Moderate mitral valve regurgitation, with centrally-directed jet. No evidence of mitral valve stenosis. MV peak gradient, 7.6 mmHg. The mean mitral valve gradient is 3.0 mmHg. Tricuspid Valve: The tricuspid valve is normal in structure. Tricuspid valve regurgitation is moderate . No evidence of tricuspid stenosis. Aortic Valve: The aortic valve is tricuspid. There is mild calcification of the aortic valve. Aortic valve regurgitation is mild. Aortic valve sclerosis/calcification is present, without any evidence of aortic stenosis. Pulmonic Valve: The pulmonic valve was normal in structure. Pulmonic valve regurgitation is not visualized. No evidence of pulmonic stenosis. Aorta: Aortic dilatation noted. There is borderline dilatation of the ascending aorta, measuring 38 mm. IAS/Shunts: No atrial level shunt detected by color flow Doppler.  LEFT VENTRICLE PLAX 2D LVIDd:         5.10 cm     Diastology LVIDs:         3.10 cm     LV e' medial:    5.66 cm/s LV PW:         1.20 cm     LV E/e' medial:  23.1 LV IVS:        1.10 cm     LV e' lateral:   10.40 cm/s LVOT diam:     2.30 cm     LV E/e' lateral: 12.6 LV SV:         107 LV SV Index:   61 LVOT Area:     4.15 cm  LV Volumes (MOD) LV vol d, MOD A2C: 67.8 ml LV vol d, MOD A4C: 70.6 ml LV vol s, MOD A2C: 26.0 ml LV vol s, MOD A4C: 25.6 ml LV SV MOD A2C:     41.8 ml LV SV MOD A4C:     70.6 ml LV SV MOD BP:      45.2 ml RIGHT VENTRICLE             IVC RV S prime:     12.20 cm/s  IVC diam: 2.20 cm TAPSE (M-mode): 1.4 cm LEFT ATRIUM           Index        RIGHT ATRIUM           Index LA diam:      3.40 cm 1.94 cm/m   RA Area:     13.40 cm LA Vol (A2C): 24.9 ml 14.19 ml/m  RA Volume:   35.40 ml  20.18 ml/m LA Vol (A4C): 62.1 ml 35.39 ml/m  AORTIC VALVE LVOT Vmax:   132.00 cm/s LVOT Vmean:  86.500 cm/s LVOT VTI:    0.257 m  AORTA Ao Root diam: 3.10 cm Ao Asc diam:  3.80 cm MITRAL VALVE                TRICUSPID VALVE MV Area  (PHT): 4.30 cm  TR Peak grad:   31.1 mmHg MV Area VTI:   2.47 cm     TR Vmax:        279.00 cm/s MV Peak grad:  7.6 mmHg MV Mean grad:  3.0 mmHg     SHUNTS MV Vmax:       1.38 m/s     Systemic VTI:  0.26 m MV Vmean:      82.0 cm/s    Systemic Diam: 2.30 cm MV Decel Time: 177 msec MV E velocity: 131.00 cm/s MV A velocity: 125.50 cm/s MV E/A ratio:  1.04 Arvilla Meres MD Electronically signed by Arvilla Meres MD Signature Date/Time: 04/07/2023/1:55:02 PM    Final    NM Hepatobiliary Liver Func Result Date: 04/07/2023 CLINICAL DATA:  Gallstones.  Evaluate for acute cholecystitis. EXAM: NUCLEAR MEDICINE HEPATOBILIARY IMAGING TECHNIQUE: Sequential images of the abdomen were obtained out to 60 minutes following intravenous administration of radiopharmaceutical. RADIOPHARMACEUTICALS:  5.0 mCi Tc-16m  Choletec IV COMPARISON:  Abdominal sonogram from 04/08/2023 FINDINGS: Prompt uptake of activity by the liver is seen. After 60 minutes of imaging there was persistent radiotracer uptake throughout both lobes of the liver without signs of biliary or bowel activity.Patient was reimaged at 120 minutes with similar degree of diffuse uptake throughout the liver without biliary or bowel activity. IMPRESSION: 1. There is persistent radiotracer activity noted throughout the liver after 120 minutes of imaging without biliary or bowel activity. These findings may be seen in the setting of hepatic site dysfunction as well as common bile duct obstruction. Electronically Signed   By: Signa Kell M.D.   On: 04/07/2023 11:23    Anti-infectives: Anti-infectives (From admission, onward)    Start     Dose/Rate Route Frequency Ordered Stop   04/07/23 1800  ceFEPIme (MAXIPIME) 2 g in sodium chloride 0.9 % 100 mL IVPB  Status:  Discontinued        2 g 200 mL/hr over 30 Minutes Intravenous Every 24 hours 04/06/23 2150 04/07/23 1012   04/07/23 1400  cefTRIAXone (ROCEPHIN) 2 g in sodium chloride 0.9 % 100 mL IVPB        2  g 200 mL/hr over 30 Minutes Intravenous Every 24 hours 04/07/23 1012     04/06/23 2200  metroNIDAZOLE (FLAGYL) IVPB 500 mg  Status:  Discontinued        500 mg 100 mL/hr over 60 Minutes Intravenous Every 12 hours 04/06/23 2152 04/07/23 1012   04/06/23 1830  ceFEPIme (MAXIPIME) 2 g in sodium chloride 0.9 % 100 mL IVPB        2 g 200 mL/hr over 30 Minutes Intravenous  Once 04/06/23 1815 04/06/23 1957   04/06/23 1830  vancomycin (VANCOREADY) IVPB 1500 mg/300 mL        1,500 mg 150 mL/hr over 120 Minutes Intravenous  Once 04/06/23 1815 04/06/23 2140        Assessment/Plan Cholelithiasis, suspect passed choledocholithiasis with ascending cholangitis elevated LFTs -HIDA not helpful in determination of cholecystitis, ultrasound and MRCP negative for cholecystitis -no definite choledocholithiasis on MRCP, LFTs downtrending, GI following. -if she does have CBD stone, then family is considering ERCP. Patient and family confirm that they are hoping to avoid all surgery/procedures. - advance diet per SLP. CCS will sign off, call as needed.  FEN - FLD - advance per speech VTE - SCDs ID - Rocephin  I reviewed nursing notes, hospitalist notes, last 24 h vitals and pain scores, last 48 h intake and output, last 24 h  labs and trends, and last 24 h imaging results.   LOS: 3 days    Adam Phenix , [facility] 04/09/2023, 8:09 AM Please see Amion for pager number during day hours 7:00am-4:30pm or 7:00am -11:30am on weekends

## 2023-04-10 DIAGNOSIS — K8309 Other cholangitis: Secondary | ICD-10-CM | POA: Diagnosis not present

## 2023-04-10 DIAGNOSIS — R7881 Bacteremia: Secondary | ICD-10-CM | POA: Diagnosis not present

## 2023-04-10 LAB — HEPATIC FUNCTION PANEL
ALT: 80 U/L — ABNORMAL HIGH (ref 0–44)
AST: 84 U/L — ABNORMAL HIGH (ref 15–41)
Albumin: 2.6 g/dL — ABNORMAL LOW (ref 3.5–5.0)
Alkaline Phosphatase: 480 U/L — ABNORMAL HIGH (ref 38–126)
Bilirubin, Direct: 0.6 mg/dL — ABNORMAL HIGH (ref 0.0–0.2)
Indirect Bilirubin: 0.7 mg/dL (ref 0.3–0.9)
Total Bilirubin: 1.3 mg/dL — ABNORMAL HIGH (ref 0.0–1.2)
Total Protein: 7.1 g/dL (ref 6.5–8.1)

## 2023-04-10 LAB — COMPREHENSIVE METABOLIC PANEL WITH GFR
ALT: 86 U/L — ABNORMAL HIGH (ref 0–44)
AST: 101 U/L — ABNORMAL HIGH (ref 15–41)
Albumin: 2.4 g/dL — ABNORMAL LOW (ref 3.5–5.0)
Alkaline Phosphatase: 492 U/L — ABNORMAL HIGH (ref 38–126)
Anion gap: 11 (ref 5–15)
BUN: 17 mg/dL (ref 8–23)
CO2: 20 mmol/L — ABNORMAL LOW (ref 22–32)
Calcium: 9 mg/dL (ref 8.9–10.3)
Chloride: 102 mmol/L (ref 98–111)
Creatinine, Ser: 0.84 mg/dL (ref 0.44–1.00)
GFR, Estimated: >60 mL/min (ref >60)
Glucose, Bld: 116 mg/dL — ABNORMAL HIGH (ref 70–99)
Potassium: 3.7 mmol/L (ref 3.5–5.1)
Sodium: 133 mmol/L — ABNORMAL LOW (ref 135–145)
Total Bilirubin: 1.8 mg/dL — ABNORMAL HIGH (ref 0.0–1.2)
Total Protein: 6.4 g/dL — ABNORMAL LOW (ref 6.5–8.1)

## 2023-04-10 LAB — CBC WITH DIFFERENTIAL/PLATELET
Abs Immature Granulocytes: 0.07 10*3/uL (ref 0.00–0.07)
Basophils Absolute: 0 10*3/uL (ref 0.0–0.1)
Basophils Relative: 0 %
Eosinophils Absolute: 0.5 10*3/uL (ref 0.0–0.5)
Eosinophils Relative: 5 %
HCT: 29.7 % — ABNORMAL LOW (ref 36.0–46.0)
Hemoglobin: 10 g/dL — ABNORMAL LOW (ref 12.0–15.0)
Immature Granulocytes: 1 %
Lymphocytes Relative: 20 %
Lymphs Abs: 1.8 10*3/uL (ref 0.7–4.0)
MCH: 33.4 pg (ref 26.0–34.0)
MCHC: 33.7 g/dL (ref 30.0–36.0)
MCV: 99.3 fL (ref 80.0–100.0)
Monocytes Absolute: 0.5 10*3/uL (ref 0.1–1.0)
Monocytes Relative: 6 %
Neutro Abs: 6.2 10*3/uL (ref 1.7–7.7)
Neutrophils Relative %: 68 %
Platelets: 275 10*3/uL (ref 150–400)
RBC: 2.99 MIL/uL — ABNORMAL LOW (ref 3.87–5.11)
RDW: 12 % (ref 11.5–15.5)
WBC: 9.1 10*3/uL (ref 4.0–10.5)
nRBC: 0 % (ref 0.0–0.2)

## 2023-04-10 MED ORDER — SODIUM CHLORIDE 0.9 % IV SOLN
2.0000 g | Freq: Once | INTRAVENOUS | Status: AC
  Administered 2023-04-10: 2 g via INTRAVENOUS
  Filled 2023-04-10: qty 20

## 2023-04-10 NOTE — Plan of Care (Signed)
  Problem: Pain Managment: Goal: General experience of comfort will improve and/or be controlled Outcome: Progressing   Problem: Safety: Goal: Ability to remain free from injury will improve Outcome: Progressing

## 2023-04-10 NOTE — Evaluation (Signed)
 Physical Therapy Evaluation Patient Details Name: [name] MRN: [id number] DOB: [birth year] Today's Date: 04/10/2023  History of Present Illness  The pt is a 89 yo female presenting 2/23 from [place] nursing facility with diarrhea and perioral cyanosis. Work up revealed sepsis, cholelithiasis, and elevated LFTs. PMH includes: TIA, HTN, HLD, depression, oral squamous cell carcinoma, and dementia. Pt currently under hospice care for oral cancer.   Clinical Impression  Pt in bed upon arrival of PT, agreeable to evaluation at this time. Prior to admission the pt was living at [facility], where staff assisted with all transfers to and from [facility], and ALDs. The pt's family is hopeful she can return there with Hospice continued at d/c. The pt presents with generalized weakness, decreased ROM in all LE joints, and poor activity tolerance. However, her daughter was present and reports this is near her current baseline. With assist of 2 and use of stedy, the pt was able to power up to standing and transfer to recliner. She has limited strength and power in BLE and poor standing balance with fear of anterior wt shift needed for stance. Will follow acutely but anticipate no follow up therapies once pt stable to return to her facility.        If plan is discharge home, recommend the following: Two people to help with walking and/or transfers;Two people to help with bathing/dressing/bathroom;Assistance with feeding;Assistance with cooking/housework;Direct supervision/assist for medications management;Direct supervision/assist for financial management;Assist for transportation;Help with stairs or ramp for entrance;Supervision due to cognitive status   Can travel by private vehicle        Equipment Recommendations None recommended by PT (facility is well equipped)  Recommendations for Other Services       Functional Status Assessment Patient has not had a recent decline in their functional status      Precautions / Restrictions Precautions Precautions: Fall Recall of Precautions/Restrictions: Impaired Restrictions Weight Bearing Restrictions Per Provider Order: No      Mobility  Bed Mobility Overal bed mobility: Needs Assistance Bed Mobility: Rolling, Supine to Sit, Sit to Supine Rolling: Contact guard assist, Used rails   Supine to sit: HOB elevated, Used rails, Mod assist Sit to supine: Used rails, HOB elevated, Mod assist   General bed mobility comments: min-modA to complete trunk elevation, completed x2 due to discovery of BM    Transfers Overall transfer level: Needs assistance Equipment used: 1 person hand held assist, 2 person hand held assist, Ambulation equipment used Transfers: Sit to/from Stand, Bed to chair/wheelchair/BSC Sit to Stand: Max assist, +2 physical assistance, Mod assist, +2 safety/equipment, From elevated surface, Via lift equipment           General transfer comment: initially maxA of 1 with minimal hip clearance, then good stand with maxA of2, used stedy for ease of transfer due to pt inability to step and feet sliding forwards Transfer via Lift Equipment: Stedy  Ambulation/Gait               General Gait Details: pt unable at baseline    Balance Overall balance assessment: Needs assistance Sitting-balance support: No upper extremity supported, Feet supported Sitting balance-Leahy Scale: Good   Postural control: Posterior lean Standing balance support: Bilateral upper extremity supported, Reliant on assistive device for balance Standing balance-Leahy Scale: Zero Standing balance comment: dependent on UE support and external assist of 2  Pertinent Vitals/Pain Pain Assessment Pain Assessment: No/denies pain    Home Living Family/patient expects to be discharged to:: Hospice/Palliative care                 Home Equipment: Wheelchair - manual Additional Comments: pt from SNF  with hospice care, plan is to return    Prior Function Prior Level of Function : Needs assist       Physical Assist : Mobility (physical);ADLs (physical) Mobility (physical): Bed mobility;Transfers;Gait ADLs (physical): Feeding;Grooming;Bathing;Dressing;Toileting Mobility Comments: staff assist her to and from [facility] ADLs Comments: staff assist with ADLs     Extremity/Trunk Assessment   Upper Extremity Assessment Upper Extremity Assessment: Generalized weakness    Lower Extremity Assessment Lower Extremity Assessment: Generalized weakness (bilateral limited knee ROM)    Cervical / Trunk Assessment Cervical / Trunk Assessment: Kyphotic  Communication   Communication Communication: Impaired Factors Affecting Communication: Reduced clarity of speech    Cognition Arousal: Alert Behavior During Therapy: Flat affect   PT - Cognitive impairments: History of cognitive impairments                       PT - Cognition Comments: pt able to follow commands and express needs Following commands: Impaired Following commands impaired: Follows one step commands with increased time     Cueing Cueing Techniques: Verbal cues     General Comments General comments (skin integrity, edema, etc.): VSS on RA        Assessment/Plan    PT Assessment Patient needs continued PT services  PT Problem List Decreased strength;Decreased range of motion;Decreased activity tolerance;Decreased balance;Decreased mobility;Decreased coordination;Decreased cognition;Obesity;Decreased safety awareness       PT Treatment Interventions Functional mobility training;Therapeutic activities;Therapeutic exercise;Patient/family education;Balance training    PT Goals (Current goals can be found in the Care Plan section)  Acute Rehab PT Goals Patient Stated Goal: to sit in chair PT Goal Formulation: With patient Time For Goal Achievement: 04/24/23 Potential to Achieve Goals: Fair    Frequency Min  1X/week        AM-PAC PT "6 Clicks" Mobility  Outcome Measure Help needed turning from your back to your side while in a flat bed without using bedrails?: A Lot Help needed moving from lying on your back to sitting on the side of a flat bed without using bedrails?: A Lot Help needed moving to and from a bed to a chair (including a wheelchair)?: Total Help needed standing up from a chair using your arms (e.g., wheelchair or bedside chair)?: Total Help needed to walk in hospital room?: Total Help needed climbing 3-5 steps with a railing? : Total 6 Click Score: 8    End of Session Equipment Utilized During Treatment: Gait belt Activity Tolerance: Patient tolerated treatment well Patient left: in chair;with call bell/phone within reach;with chair alarm set;with nursing/sitter in room;with family/visitor present Nurse Communication: Mobility status PT Visit Diagnosis: Muscle weakness (generalized) (M62.81);History of falling (Z91.81);Other abnormalities of gait and mobility (R26.89)    Time: 1351-1425 PT Time Calculation (min) (ACUTE ONLY): 34 min   Charges:   PT Evaluation $PT Eval Moderate Complexity: 1 Mod PT Treatments $Therapeutic Activity: 8-22 mins PT General Charges $$ ACUTE PT VISIT: 1 Visit         Vickki Muff, PT, DPT   Acute Rehabilitation Department Office [phone] Secure Chat Communication Preferred  Ronnie Derby 04/10/2023, 3:05 PM

## 2023-04-10 NOTE — Progress Notes (Signed)
 [facility] Patient Visit   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 She was admitted 04/06/2023 with diagnosis of  Cholangitis. [facility] was notified by her facility of being sent to ED. Per Dr. Nolon Rod Monguilod, hospice physician, this is a related hospital admission.    Visited with patient and daughter at bedside. Patient was sleeping but easily aroused when nurse came in to reposition. Discussed plan of care with daughter, including change to PO antibiotics. She advised that discharge is tentatively planned for tomorrow if patient continues to do well.    Patient is GIP appropriate for IV antibiotics and further evaluation.    Vital Signs: 97.07/30/69   140/71  O2 93% on RA   I&O: 472/850   Abnormal labs:  Sodium: 133 (L) CO2: 20 (L) Glucose: 116 (H) Alkaline Phosphatase: 492 (H) Albumin: 2.4 (L) AST: 101 (H) ALT: 86 (H) Total Protein: 6.4 (L) Total Bilirubin: 1.8 (H) RBC: 2.99 (L) Hemoglobin: 10.0 (L) HCT: 29.7 (L)   Diagnostics: none new  IV/PRN Meds: Rocephin 2G IV x daily, Tramadol 50 mg PO x1   Assessment and Plan per Rhetta Mura, MD note 3.26.25: Plan   Sepsis on admission with lactic acidosis on admission Bili obstruction transaminitis Patient and family have elected to not undergo ERCP/operative management unless significant changes to condition in terms of worsening or significant elevation of LFTs LFTs continues to trend down, continue ceftriaxone 2 g q. 24 Would treat for 10 days Blood culture growing E. coli so we will de-escalate in the next 24 hours to   Prior TIA, dementia No confusion at this time-continue supportive therapy only Plan-0.25 at bedtime for agitation Anxiety Can continue aspirin 81   Squamous cell CA not surgical candidate She does have squamous cell CA and a fistula-no change in management-is on hospice and will go home  on the same She has been cleared for dysphagia 1 diet   Depression Klonopin as above,-resume Paxil 30 daily   HTN Continues metoprolol 25 daily--would hold Lasix 20 daily for now   Arthritis   Discussed with daughter at the bedside   Discharge Planning: Ongoing   Family Contact: spoke with daughter at bedside   IDT: Updated   Goals of Care: DNR   Henderson Newcomer, LPN [facility] Liaison [phone]

## 2023-04-10 NOTE — Progress Notes (Addendum)
 [facility] ROUNDING NOTE [name] UVO:[id number]  DOB: [birth year]  DOA: 04/06/2023  PCP: Ellan Lambert, NP  04/10/2023,1:12 PM  LOS: 4 days    Code Status: DNR   from: [facility] assisted and is wheelchair-bound at baseline current Dispo: Unclear   89 year old white female wheelchair-bound at baseline ?  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 Melinda Crutch at [facility] a candidate for chemo immunotherapy Prior TIA HTN HLD Arthritis right knee 3/23 admission from skilled facility with cyanosis Tmax 102 diarrhea heart rate 130 BP 01/31/1983 Sodium 132 BUN/creatinine 25/1.6 WBC 18 hemoglobin 11 platelet 354 RVP negative hepatitis serology negative  blood culture grew E. coli [place] ABD gallbladder sludge small stone, HIDA persistent radiotracer activity throughout liver  3/24 General Surgery consulted, GI consulted and felt conservative approach better-diet resumed 3/25 MRCP no choledocholithiasis CBD evaluation limited by motion artifact gallbladder distended with internal sludge no wall thickening   Plan  Sepsis on admission with lactic acidosis on admission E. coli growing on admission blood culture Possibility of bili obstruction transaminitis Patient and family have elected to not undergo ERCP/operative management unless significant changes to labs/condition  continue ceftriaxone 2 g q. 24 transition 3/26 to cefadroxil to complete 10 days ending 4/1--tentatively planning to return to ALF tomorrow if all stable and once therapy works with patient  Prior TIA, dementia No confusion at this time-continue supportive therapy only-continues aspirin 81  Anxiety Can continue Paxil 30, clonazepam 0.25 at bedtime  Squamous cell CA not surgical candidate She does have squamous cell CA and a fistula-no change in management-will be followed by hospice as an outpatient She has been cleared for dysphagia 1 diet   HTN Continues metoprolol 25  daily--slightly elevated pressures-would hold Lasix 20 daily\  Arthritis  Discussed with daughter at the bedside   DVT prophylaxis: SCD  Status is: Inpatient Remains inpatient appropriate because:   Requires de-escalation of antibiotics Likely will discharge with hospice at discharge   Subjective:  Doing fair eating some no chest pain no fever No nausea no vomiting No abdominal pain  Objective + exam Vitals:   04/09/23 2001 04/10/23 0508 04/10/23 0833 04/10/23 1016  BP: (!) 160/73 (!) 147/77 (!) 147/77 (!) 149/71  Pulse: 75 76 76 71  Resp: 18 18  17   Temp: 98.6 F (37 C) (!) 97.5 F (36.4 C)  97.7 F (36.5 C)  TempSrc: Oral Oral  Oral  SpO2: 92% 93%  93%  Weight:      Height:       Filed Weights   04/06/23 1800  Weight: 66.7 kg    Examination:  No icterus no pallor Chest is clear no wheeze S1-S2 no murmur Abdomen soft no rebound No lower extremity edema  Data Reviewed: reviewed   CBC    Component Value Date/Time   WBC 9.1 04/10/2023 0623   RBC 2.99 (L) 04/10/2023 0623   HGB 10.0 (L) 04/10/2023 0623   HGB 11.4 (L) 04/24/2021 1343   HCT 29.7 (L) 04/10/2023 0623   PLT 275 04/10/2023 0623   PLT 230 04/24/2021 1343   MCV 99.3 04/10/2023 0623   MCH 33.4 04/10/2023 0623   MCHC 33.7 04/10/2023 0623   RDW 12.0 04/10/2023 0623   LYMPHSABS 1.8 04/10/2023 0623   MONOABS 0.5 04/10/2023 0623   EOSABS 0.5 04/10/2023 0623   BASOSABS 0.0 04/10/2023 0623      Latest Ref Rng & Units 04/10/2023    6:23  AM 04/09/2023    6:56 AM 04/08/2023    9:23 AM  CMP  Glucose 70 - 99 mg/dL 409  89  80   BUN 8 - 23 mg/dL 17  15  17    Creatinine 0.44 - 1.00 mg/dL 8.11  9.14  7.82   Sodium 135 - 145 mmol/L 133  138  135   Potassium 3.5 - 5.1 mmol/L 3.7  3.9  3.8   Chloride 98 - 111 mmol/L 102  106  106   CO2 22 - 32 mmol/L 20  22  20    Calcium 8.9 - 10.3 mg/dL 9.0  9.2  9.0   Total Protein 6.5 - 8.1 g/dL 6.4  6.9  6.9   Total Bilirubin 0.0 - 1.2 mg/dL 1.8  1.6  3.1    Alkaline Phos 38 - 126 U/L 492  342  324   AST 15 - 41 U/L 101  73  157   ALT 0 - 44 U/L 86  98  145     Scheduled Meds:  aspirin EC  81 mg Oral Daily   cefadroxil  1,000 mg Oral BID   clonazepam  0.25 mg Oral QHS   diclofenac Sodium  4 g Topical TID AC & HS   feeding supplement  237 mL Oral TID WC   metoprolol tartrate  25 mg Oral Daily   pantoprazole  40 mg Oral Q0600   PARoxetine  30 mg Oral Daily   polyvinyl alcohol  1 drop Both Eyes BID   Continuous Infusions:    Time  25  Rhetta Mura, MD  [facility]

## 2023-04-10 NOTE — Progress Notes (Signed)
 [name] ROUNDING NOTE   Subjective: Feeling better today.  Sitting upright in chair and did well with lunch per daughter at bedside.  She is more alert and conversive.  Liver enzymes this morning had slightly up trended with AST/ALT 101/86 and T. bili 1.8.  Repeat enzymes this afternoon again show downtrend at 84/80 and T. bili 1.3, respectively.   Objective: Vital signs in last 24 hours: Temp:  [97.5 F (36.4 C)-98.6 F (37 C)] 97.7 F (36.5 C) (03/27 1016) Pulse Rate:  [71-76] 71 (03/27 1016) Resp:  [17-18] 17 (03/27 1016) BP: (147-160)/(71-77) 149/71 (03/27 1016) SpO2:  [92 %-93 %] 93 % (03/27 1016) Last BM Date : 04/08/23 General: NAD Abdomen:  Soft, NT, ND, +BS    Intake/Output from previous day: 03/26 0701 - 03/27 0700 In: 472 [P.O.:472] Out: 850 [Urine:850] Intake/Output this shift: Total I/O In: 360 [P.O.:360] Out: 150 [Urine:150]   Lab Results: Recent Labs    04/08/23 0923 04/09/23 0656 04/10/23 0623  WBC 10.5 10.1 9.1  HGB 9.6* 10.5* 10.0*  PLT 259 296 275  MCV 100.3* 101.3* 99.3   BMET Recent Labs    04/08/23 0923 04/09/23 0656 04/10/23 0623  NA 135 138 133*  K 3.8 3.9 3.7  CL 106 106 102  CO2 20* 22 20*  GLUCOSE 80 89 116*  BUN 17 15 17   CREATININE 1.02* 0.98 0.84  CALCIUM 9.0 9.2 9.0   LFT Recent Labs    04/09/23 0656 04/10/23 0623 04/10/23 1500  PROT 6.9 6.4* 7.1  ALBUMIN 2.6* 2.4* 2.6*  AST 73* 101* 84*  ALT 98* 86* 80*  ALKPHOS 342* 492* 480*  BILITOT 1.6* 1.8* 1.3*  BILIDIR  --   --  0.6*  IBILI  --   --  0.7   PT/INR Recent Labs    04/09/23 0656  INR 1.0      Imaging/Other results: No results found.    Assessment and Plan:  1) Cholangitis 2) Elevated liver enzymes-improving 3) Elevated bilirubin-improving 4) Leukocytosis-resolved 89 year old nursing home resident admitted 04/06/2023 with fever and tachycardia.  Evaluation consistent with cholangitis with likely passed sludge/stone.MRCP  did show mildly dilated intra and extrahepatic ducts but no definite choledocholithiasis, there was some suggestion of sludge in the distal duct. No cholecystitis.  Has done well with medical therapy alone with plan to convert to cefadroxil today per discussion with Hospitalist.  Liver enzymes with very slight uptrend earlier today, but repeat labs in the afternoon show good downtrend.  She is otherwise doing well clinically and continues to improve.  Plan for continued medical management in this 89 year old, with reservation of ERCP for any clinical decline given the significantly elevated risks and previous multiple conversations with patient and family members.  Inpatient GI service will remain available as needed.   Brianna Cleverly, DO  04/10/2023, 6:33 PM [facility] Pager [phone]

## 2023-04-11 DIAGNOSIS — K8309 Other cholangitis: Secondary | ICD-10-CM

## 2023-04-11 LAB — HEPATIC FUNCTION PANEL
ALT: 59 U/L — ABNORMAL HIGH (ref 0–44)
AST: 47 U/L — ABNORMAL HIGH (ref 15–41)
Albumin: 2.5 g/dL — ABNORMAL LOW (ref 3.5–5.0)
Alkaline Phosphatase: 405 U/L — ABNORMAL HIGH (ref 38–126)
Bilirubin, Direct: 0.4 mg/dL — ABNORMAL HIGH (ref 0.0–0.2)
Indirect Bilirubin: 0.5 mg/dL (ref 0.3–0.9)
Total Bilirubin: 0.9 mg/dL (ref 0.0–1.2)
Total Protein: 6.6 g/dL (ref 6.5–8.1)

## 2023-04-11 LAB — CBC WITH DIFFERENTIAL/PLATELET
Abs Immature Granulocytes: 0.06 10*3/uL (ref 0.00–0.07)
Basophils Absolute: 0 10*3/uL (ref 0.0–0.1)
Basophils Relative: 0 %
Eosinophils Absolute: 0.6 10*3/uL — ABNORMAL HIGH (ref 0.0–0.5)
Eosinophils Relative: 7 %
HCT: 31.6 % — ABNORMAL LOW (ref 36.0–46.0)
Hemoglobin: 10.3 g/dL — ABNORMAL LOW (ref 12.0–15.0)
Immature Granulocytes: 1 %
Lymphocytes Relative: 19 %
Lymphs Abs: 1.8 10*3/uL (ref 0.7–4.0)
MCH: 32.7 pg (ref 26.0–34.0)
MCHC: 32.6 g/dL (ref 30.0–36.0)
MCV: 100.3 fL — ABNORMAL HIGH (ref 80.0–100.0)
Monocytes Absolute: 0.5 10*3/uL (ref 0.1–1.0)
Monocytes Relative: 5 %
Neutro Abs: 6.2 10*3/uL (ref 1.7–7.7)
Neutrophils Relative %: 68 %
Platelets: 278 10*3/uL (ref 150–400)
RBC: 3.15 MIL/uL — ABNORMAL LOW (ref 3.87–5.11)
RDW: 12.1 % (ref 11.5–15.5)
WBC: 9.1 10*3/uL (ref 4.0–10.5)
nRBC: 0 % (ref 0.0–0.2)

## 2023-04-11 LAB — BASIC METABOLIC PANEL WITH GFR
Anion gap: 13 (ref 5–15)
BUN: 20 mg/dL (ref 8–23)
CO2: 20 mmol/L — ABNORMAL LOW (ref 22–32)
Calcium: 8.7 mg/dL — ABNORMAL LOW (ref 8.9–10.3)
Chloride: 101 mmol/L (ref 98–111)
Creatinine, Ser: 0.84 mg/dL (ref 0.44–1.00)
GFR, Estimated: >60 mL/min (ref >60)
Glucose, Bld: 107 mg/dL — ABNORMAL HIGH (ref 70–99)
Potassium: 3.7 mmol/L (ref 3.5–5.1)
Sodium: 134 mmol/L — ABNORMAL LOW (ref 135–145)

## 2023-04-11 MED ORDER — SODIUM CHLORIDE 0.9 % IV SOLN
2.0000 g | INTRAVENOUS | Status: AC
Start: 2023-04-11 — End: 2023-04-13
  Administered 2023-04-11 – 2023-04-12 (×2): 2 g via INTRAVENOUS
  Filled 2023-04-11 (×2): qty 20

## 2023-04-11 NOTE — TOC Progression Note (Signed)
 Transition of Care [facility]) - Progression Note    Patient Details  Name: [name] MRN: [id number] Date of Birth: 30-Jun-1924  [facility] CM/SW Contact  Dellie Burns [place], [place] Phone Number: 04/11/2023, 12:10 PM  Clinical Narrative:   Sherron Monday to Belenda Cruise with [facility] who confirmed pt is a LTC resident and able to return pending medical clearance, including over weekend. SW will follow.   Dellie Burns, MSW, LCSW [phone] (coverage)      Expected Discharge Plan: Skilled Nursing Facility Barriers to Discharge: Continued Medical Work up  Expected Discharge Plan and Services                                               Social Determinants of Health (SDOH) Interventions SDOH Screenings   Food Insecurity: No Food Insecurity (04/06/2023)  Housing: Low Risk  (04/06/2023)  Transportation Needs: No Transportation Needs (04/06/2023)  Utilities: Not At Risk (04/06/2023)  Social Connections: Unknown (04/06/2023)  Tobacco Use: Medium Risk (04/07/2023)    Readmission Risk Interventions     No data to display

## 2023-04-11 NOTE — Care Management Important Message (Signed)
 Important Message  Patient Details  Name: [name] MRN: [id number] Date of Birth: 07-30-1924   Important Message Given:  Yes - Medicare IM     Dorena Bodo 04/11/2023, 3:06 PM

## 2023-04-11 NOTE — Progress Notes (Signed)
 [facility] Patient Visit   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 She was admitted 04/06/2023 with diagnosis of Cholangitis. [facility] was notified by her facility of being sent to ED. Per Dr. Nolon Rod Monguilod, hospice physician, this is a related hospital admission.    Visited with patient and daughter at bedside. Patient was more confused today per daughter. She was aware and participated in conversation. Discussed plan of care with daughter. Patient to receive a few more days of IV antibiotics before transitioning to PO.     Patient is GIP appropriate for IV antibiotics and further evaluation.    Vital Signs: 97.09/30/72   125/77  O2 93% on RA   I&O: 164/none recorded   Abnormal labs:  04/11/23 05:35 BASIC METABOLIC PANEL WITH GFR:  Sodium: 134 (L) CO2: 20 (L) Glucose: 107 (H) Calcium: 8.7 (L) Alkaline Phosphatase: 405 (H) Albumin: 2.5 (L) AST: 47 (H) ALT: 59 (H) Bilirubin, Direct: 0.4 (H) RBC: 3.15 (L) Hemoglobin: 10.3 (L) HCT: 31.6 (L) MCV: 100.3 (H) Eosinophils Absolute: 0.6 (H)  Diagnostics: none new   IV/PRN Meds: Rocephin 2G IV x daily   Assessment and Plan per Rhetta Mura, MD note 3.28.25:  Plan Sepsis on admission with lactic acidosis on admission E. coli growing on admission blood culture Possibility of bili obstruction transaminitis Patient and family have elected to not undergo ERCP/operative management unless clinically worsened-LFTs have improved, patient afebrile After discussion with Dr. Barron Alvine 3/27, continue ceftriaxone for total of 7 days and not de-escalating until 7 days completed-will need to stay in the hospital to ensure this is done   Prior TIA, dementia No confusion at this time-continue supportive therapy only-continues aspirin 81 and have updated daughter daily   Anxiety Can continue Paxil 30, clonazepam 0.25 at bedtime    Squamous cell CA not surgical candidate She does have squamous cell CA and a fistula-no change in management-will be followed by hospice as an outpatient She has been cleared for dysphagia 1 diet and seems to be tolerating fairly   HTN Continues metoprolol 25 daily--slightly elevated pressures-would hold Lasix 20 daily\   Arthritis First choice Tylenol-does have tramadol 50 3 times daily as needed on board but would try to minimize unless absolutely necessary   Discussed with daughter at the bedside on several days   Discharge Planning: Ongoing   Family Contact: spoke with daughter at bedside   IDT: Updated   Goals of Care: DNR   Roe Rutherford, BSN, RN [facility] Liaison [phone]

## 2023-04-11 NOTE — Progress Notes (Signed)
 TRH ROUNDING NOTE [name] WUX:[id number]  DOB: 12/15/1924  DOA: 04/06/2023  PCP: Ellan Lambert, NP  04/11/2023,9:32 AM  LOS: 5 days    Code Status: DNR   from: [facility] and is wheelchair-bound at baseline current Dispo: Unclear   89 year old white female wheelchair-bound at baseline ?  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 Melinda Crutch at [facility] a candidate for chemo immunotherapy Prior TIA HTN HLD Arthritis right knee 3/23 admission from skilled facility with cyanosis Tmax 102 diarrhea heart rate 130 BP 01/31/1983 Sodium 132 BUN/creatinine 25/1.6 WBC 18 hemoglobin 11 platelet 354 RVP negative hepatitis serology negative  blood culture grew E. coli [place] ABD gallbladder sludge small stone, HIDA persistent radiotracer activity throughout liver  3/24 General Surgery consulted, GI consulted and felt conservative approach better-diet resumed 3/25 MRCP no choledocholithiasis CBD evaluation limited by motion artifact gallbladder distended with internal sludge no wall thickening   Plan  Sepsis on admission with lactic acidosis on admission E. coli growing on admission blood culture Possibility of bili obstruction transaminitis Patient and family have elected to not undergo ERCP/operative management unless clinically worsened-LFTs have improved, patient afebrile After discussion with Dr. Barron Alvine 3/27, continue ceftriaxone for total of 7 days and not de-escalating until 7 days completed-will need to stay in the hospital to ensure this is done  Prior TIA, dementia No confusion at this time-continue supportive therapy only-continues aspirin 81 and have updated daughter daily  Anxiety Can continue Paxil 30, clonazepam 0.25 at bedtime  Squamous cell CA not surgical candidate She does have squamous cell CA and a fistula-no change in management-will be followed by hospice as an outpatient She has been cleared for dysphagia  1 diet and seems to be tolerating fairly  HTN Continues metoprolol 25 daily--slightly elevated pressures-would hold Lasix 20 daily\  Arthritis First choice Tylenol-does have tramadol 50 3 times daily as needed on board but would try to minimize unless absolutely necessary  Discussed with daughter at the bedside on several days   DVT prophylaxis: SCD  Status is: Inpatient Remains inpatient appropriate because:   Needs to complete 7 days of IV antibiotics likely discharge later this week   Subjective:  Looks well more coherent no distress no fever no chills no chest pain Can recognize her daughter-1 stool yesterday-relatively immobile  Objective + exam Vitals:   04/10/23 1016 04/10/23 2203 04/11/23 0442 04/11/23 0917  BP: (!) 149/71 (!) 156/73 138/76 125/77  Pulse: 71 74 80 74  Resp: 17 18 18 17   Temp: 97.7 F (36.5 C) 98 F (36.7 C) 98.2 F (36.8 C) 97.9 F (36.6 C)  TempSrc: Oral   Oral  SpO2: 93% 95% 95% 93%  Weight:      Height:       Filed Weights   04/06/23 1800  Weight: 66.7 kg    Examination:  No icterus pallor flat affect Kyphotic S1-S2?  Murmur Chest is clear posteriorly Abdomen soft no rebound no guarding ROM intact Power 5/5 grossly but limited by effort  Data Reviewed: reviewed   CBC    Component Value Date/Time   WBC 9.1 04/11/2023 0535   RBC 3.15 (L) 04/11/2023 0535   HGB 10.3 (L) 04/11/2023 0535   HGB 11.4 (L) 04/24/2021 1343   HCT 31.6 (L) 04/11/2023 0535   PLT 278 04/11/2023 0535   PLT 230 04/24/2021 1343   MCV 100.3 (H) 04/11/2023 0535   MCH 32.7 04/11/2023 0535  MCHC 32.6 04/11/2023 0535   RDW 12.1 04/11/2023 0535   LYMPHSABS 1.8 04/11/2023 0535   MONOABS 0.5 04/11/2023 0535   EOSABS 0.6 (H) 04/11/2023 0535   BASOSABS 0.0 04/11/2023 0535      Latest Ref Rng & Units 04/11/2023    5:35 AM 04/10/2023    3:00 PM 04/10/2023    6:23 AM  CMP  Glucose 70 - 99 mg/dL 409   811   BUN 8 - 23 mg/dL 20   17   Creatinine 9.14 - 1.00  mg/dL 7.82   9.56   Sodium 213 - 145 mmol/L 134   133   Potassium 3.5 - 5.1 mmol/L 3.7   3.7   Chloride 98 - 111 mmol/L 101   102   CO2 22 - 32 mmol/L 20   20   Calcium 8.9 - 10.3 mg/dL 8.7   9.0   Total Protein 6.5 - 8.1 g/dL 6.6  7.1  6.4   Total Bilirubin 0.0 - 1.2 mg/dL 0.9  1.3  1.8   Alkaline Phos 38 - 126 U/L 405  480  492   AST 15 - 41 U/L 47  84  101   ALT 0 - 44 U/L 59  80  86     Scheduled Meds:  aspirin EC  81 mg Oral Daily   clonazepam  0.25 mg Oral QHS   diclofenac Sodium  4 g Topical TID AC & HS   feeding supplement  237 mL Oral TID WC   metoprolol tartrate  25 mg Oral Daily   pantoprazole  40 mg Oral Q0600   PARoxetine  30 mg Oral Daily   polyvinyl alcohol  1 drop Both Eyes BID   Continuous Infusions:  cefTRIAXone (ROCEPHIN)  IV 2 g (04/11/23 0930)   Time  25  Rhetta Mura, MD  [facility]

## 2023-04-11 NOTE — Plan of Care (Signed)

## 2023-04-12 DIAGNOSIS — K8309 Other cholangitis: Secondary | ICD-10-CM | POA: Diagnosis not present

## 2023-04-12 LAB — CBC WITH DIFFERENTIAL/PLATELET
Abs Immature Granulocytes: 0.1 10*3/uL — ABNORMAL HIGH (ref 0.00–0.07)
Basophils Absolute: 0 10*3/uL (ref 0.0–0.1)
Basophils Relative: 0 %
Eosinophils Absolute: 0.6 10*3/uL — ABNORMAL HIGH (ref 0.0–0.5)
Eosinophils Relative: 7 %
HCT: 30.5 % — ABNORMAL LOW (ref 36.0–46.0)
Hemoglobin: 10 g/dL — ABNORMAL LOW (ref 12.0–15.0)
Immature Granulocytes: 1 %
Lymphocytes Relative: 19 %
Lymphs Abs: 1.7 10*3/uL (ref 0.7–4.0)
MCH: 32.9 pg (ref 26.0–34.0)
MCHC: 32.8 g/dL (ref 30.0–36.0)
MCV: 100.3 fL — ABNORMAL HIGH (ref 80.0–100.0)
Monocytes Absolute: 0.6 10*3/uL (ref 0.1–1.0)
Monocytes Relative: 7 %
Neutro Abs: 5.7 10*3/uL (ref 1.7–7.7)
Neutrophils Relative %: 66 %
Platelets: 287 10*3/uL (ref 150–400)
RBC: 3.04 MIL/uL — ABNORMAL LOW (ref 3.87–5.11)
RDW: 12.5 % (ref 11.5–15.5)
WBC: 8.7 10*3/uL (ref 4.0–10.5)
nRBC: 0 % (ref 0.0–0.2)

## 2023-04-12 LAB — HEPATIC FUNCTION PANEL
ALT: 45 U/L — ABNORMAL HIGH (ref 0–44)
AST: 34 U/L (ref 15–41)
Albumin: 2.5 g/dL — ABNORMAL LOW (ref 3.5–5.0)
Alkaline Phosphatase: 319 U/L — ABNORMAL HIGH (ref 38–126)
Bilirubin, Direct: 0.3 mg/dL — ABNORMAL HIGH (ref 0.0–0.2)
Indirect Bilirubin: 0.6 mg/dL (ref 0.3–0.9)
Total Bilirubin: 0.9 mg/dL (ref 0.0–1.2)
Total Protein: 6.5 g/dL (ref 6.5–8.1)

## 2023-04-12 LAB — BASIC METABOLIC PANEL WITH GFR
Anion gap: 9 (ref 5–15)
BUN: 16 mg/dL (ref 8–23)
CO2: 25 mmol/L (ref 22–32)
Calcium: 9 mg/dL (ref 8.9–10.3)
Chloride: 101 mmol/L (ref 98–111)
Creatinine, Ser: 0.75 mg/dL (ref 0.44–1.00)
GFR, Estimated: >60 mL/min (ref >60)
Glucose, Bld: 103 mg/dL — ABNORMAL HIGH (ref 70–99)
Potassium: 3.7 mmol/L (ref 3.5–5.1)
Sodium: 135 mmol/L (ref 135–145)

## 2023-04-12 MED ORDER — CEFADROXIL 500 MG PO CAPS
1000.0000 mg | ORAL_CAPSULE | Freq: Two times a day (BID) | ORAL | Status: DC
  Administered 2023-04-12 – 2023-04-13 (×3): 1000 mg via ORAL
  Filled 2023-04-12 (×4): qty 2

## 2023-04-12 NOTE — Plan of Care (Signed)

## 2023-04-12 NOTE — TOC Progression Note (Signed)
 Transition of Care [facility]) - Progression Note    Patient Details  Name: [name] MRN: [id number] Date of Birth: December 25, 1924  Transition [facility]) CM/SW Contact  Donnalee Curry, LCSWA Phone Number: 04/12/2023, 12:15 PM  Clinical Narrative:     SW spoke with Belenda Cruise [facility] [phone]) confirmed pt can return tomorrow.   SW updated MD   Expected Discharge Plan: Skilled Nursing Facility Barriers to Discharge: Continued Medical Work up  Expected Discharge Plan and Services                                               Social Determinants of Health (SDOH) Interventions SDOH Screenings   Food Insecurity: No Food Insecurity (04/06/2023)  Housing: Low Risk  (04/06/2023)  Transportation Needs: No Transportation Needs (04/06/2023)  Utilities: Not At Risk (04/06/2023)  Social Connections: Unknown (04/06/2023)  Tobacco Use: Medium Risk (04/07/2023)    Readmission Risk Interventions     No data to display

## 2023-04-12 NOTE — Progress Notes (Signed)
 [facility] [id number] [facility] Patient Visit   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 She was admitted 04/06/2023 with diagnosis of Cholangitis. [facility] was notified by her facility of being sent to ED. Per Dr. Nolon Rod Monguilod, hospice physician, this is a related hospital admission.    Visited with patient and daughter at bedside. Patient resting comfortably with eyes closed. Discussed plan of care with daughter. Patient has completed IV antibiotics and will be transitioning to PO antibiotics while at facility.     Patient is GIP appropriate for monitoring and administration of IV antibiotics for Cholangitis.    Vital Signs: 97.10/01/82   129/80  O2 94% on RA   I&O: 524/600   Abnormal labs:  04/12/23 06:07 BASIC METABOLIC PANEL WITH GFR: Rpt ! Glucose: 103 (H) Alkaline Phosphatase: 319 (H) Albumin: 2.5 (L) ALT: 45 (H) Bilirubin, Direct: 0.3 (H) RBC: 3.04 (L) Hemoglobin: 10.0 (L) HCT: 30.5 (L) MCV: 100.3 (H) Eosinophils Absolute: 0.6 (H) Abs Immature Granulocytes: 0.10 (H)   Diagnostics: none new   IV/PRN Meds: Rocephin 2G IV x 1   Assessment and Plan per Rhetta Mura, MD note 3.29.25:   Plan Sepsis on admission with lactic acidosis on admission E. coli growing on admission blood culture Possibility of bili obstruction transaminitis Patient and family have elected to not undergo ERCP/operative management unless clinically worsened-LFTs have improved, patient afebrile After discussion with Dr. Barron Alvine 3/27, continue ceftriaxone for total of 7 days--- will de-escalate to cefadroxil and extend duration to 14 days total on 3/29-EOT = 04/20/2023   Prior TIA, dementia No confusion at this time-continue supportive therapy only-continues aspirin 81 and have updated daughter daily   Anxiety Can continue Paxil 30, clonazepam 0.25 at bedtime Was a little confused last  night and did not sleep well Follow trends overnight tonight and see how she does   Squamous cell CA not surgical candidate She does have squamous cell CA and a fistula-no change in management-will be followed by hospice as an outpatient She has been cleared for dysphagia 1 diet and seems to be tolerating fairly   HTN Continues metoprolol 25 daily--slightly elevated pressures-would hold Lasix 20 daily   Arthritis First choice Tylenol-does have tramadol 50 3 times daily as needed on board but would try to minimize unless absolutely necessary   Discussed with daughter at the bedside on several days   Discharge Planning: back to facility possibly tomorrow   Family Contact: spoke with daughter at bedside   IDT: Updated   Goals of Care: DNR  Should patient need ambulance transfer at discharge- please use [facility]) as they contract this service for our active hospice patients.    Roe Rutherford, BSN, [facility] Liaison [phone]

## 2023-04-12 NOTE — Progress Notes (Signed)
 TRH ROUNDING NOTE KENEDY HAISLEY ZOX:[id number]  DOB: 02-08-1924  DOA: 04/06/2023  PCP: Ellan Lambert, NP  04/12/2023,11:46 AM  LOS: 6 days    Code Status: DNR   from: [facility] assisted and is wheelchair-bound at baseline current Dispo: Unclear   89 year old white female wheelchair-bound at baseline ?  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 Melinda Crutch at [facility] a candidate for chemo immunotherapy Prior TIA HTN HLD Arthritis right knee 3/23 admission from skilled facility with cyanosis Tmax 102 diarrhea heart rate 130 BP 01/31/1983 Sodium 132 BUN/creatinine 25/1.6 WBC 18 hemoglobin 11 platelet 354 RVP negative hepatitis serology negative  blood culture grew E. coli [place] ABD gallbladder sludge small stone, HIDA persistent radiotracer activity throughout liver  3/24 General Surgery consulted, GI consulted and felt conservative approach better-diet resumed 3/25 MRCP no choledocholithiasis CBD evaluation limited by motion artifact gallbladder distended with internal sludge no wall thickening   Plan  Sepsis on admission with lactic acidosis on admission E. coli growing on admission blood culture Possibility of bili obstruction transaminitis Patient and family have elected to not undergo ERCP/operative management unless clinically worsened-LFTs have improved, patient afebrile After discussion with Dr. Barron Alvine 3/27, continue ceftriaxone for total of 7 days--- will de-escalate to cefadroxil and extend duration to 14 days total on 3/29-EOT = 04/20/2023  Prior TIA, dementia No confusion at this time-continue supportive therapy only-continues aspirin 81 and have updated daughter daily  Anxiety Can continue Paxil 30, clonazepam 0.25 at bedtime Was a little confused last night and did not sleep well Follow trends overnight tonight and see how she does  Squamous cell CA not surgical candidate She does have squamous cell CA and a  fistula-no change in management-will be followed by hospice as an outpatient She has been cleared for dysphagia 1 diet and seems to be tolerating fairly  HTN Continues metoprolol 25 daily--slightly elevated pressures-would hold Lasix 20 daily  Arthritis First choice Tylenol-does have tramadol 50 3 times daily as needed on board but would try to minimize unless absolutely necessary  Discussed with daughter at the bedside on several days   DVT prophylaxis: SCD  Status is: Inpatient Remains inpatient appropriate because:   Needs to complete 7 days of IV antibiotics likely discharge later this week   Subjective:  A little less coherent today did not sleep well overnight no chest pain no fever No nausea no vomiting Overall looks well no changes  Objective + exam Vitals:   04/11/23 1731 04/11/23 2001 04/12/23 0359 04/12/23 0900  BP: (!) 165/73 (!) 169/74 (!) 153/82 129/80  Pulse: 63 69 90 84  Resp: 17 16 17 17   Temp: 98.8 F (37.1 C) 98.4 F (36.9 C) 98.4 F (36.9 C) 97.9 F (36.6 C)  TempSrc: Oral Oral Oral Oral  SpO2: 95% 97% 93% 94%  Weight:      Height:       Filed Weights   04/06/23 1800  Weight: 66.7 kg    Examination:  No icterus pallor flat affect Kyphotic Past no murmur S1-S2 Chest is clear posteriorly Abdomen soft no rebound no guarding ROM intact Power 5/5 grossly but limited by effort  Data Reviewed: reviewed   CBC    Component Value Date/Time   WBC 8.7 04/12/2023 0607   RBC 3.04 (L) 04/12/2023 0607   HGB 10.0 (L) 04/12/2023 0607   HGB 11.4 (L) 04/24/2021 1343   HCT 30.5 (L) 04/12/2023 0607   PLT  287 04/12/2023 0607   PLT 230 04/24/2021 1343   MCV 100.3 (H) 04/12/2023 0607   MCH 32.9 04/12/2023 0607   MCHC 32.8 04/12/2023 0607   RDW 12.5 04/12/2023 0607   LYMPHSABS 1.7 04/12/2023 0607   MONOABS 0.6 04/12/2023 0607   EOSABS 0.6 (H) 04/12/2023 0607   BASOSABS 0.0 04/12/2023 0607      Latest Ref Rng & Units 04/12/2023    6:07 AM  04/11/2023    5:35 AM 04/10/2023    3:00 PM  CMP  Glucose 70 - 99 mg/dL 323  557    BUN 8 - 23 mg/dL 16  20    Creatinine 3.22 - 1.00 mg/dL 0.25  4.27    Sodium 062 - 145 mmol/L 135  134    Potassium 3.5 - 5.1 mmol/L 3.7  3.7    Chloride 98 - 111 mmol/L 101  101    CO2 22 - 32 mmol/L 25  20    Calcium 8.9 - 10.3 mg/dL 9.0  8.7    Total Protein 6.5 - 8.1 g/dL 6.5  6.6  7.1   Total Bilirubin 0.0 - 1.2 mg/dL 0.9  0.9  1.3   Alkaline Phos 38 - 126 U/L 319  405  480   AST 15 - 41 U/L 34  47  84   ALT 0 - 44 U/L 45  59  80     Scheduled Meds:  aspirin EC  81 mg Oral Daily   cefadroxil  1,000 mg Oral BID   clonazepam  0.25 mg Oral QHS   diclofenac Sodium  4 g Topical TID AC & HS   feeding supplement  237 mL Oral TID WC   metoprolol tartrate  25 mg Oral Daily   pantoprazole  40 mg Oral Q0600   PARoxetine  30 mg Oral Daily   polyvinyl alcohol  1 drop Both Eyes BID   Continuous Infusions:   Time  25  Rhetta Mura, MD  [facility]

## 2023-04-12 NOTE — Plan of Care (Signed)

## 2023-04-13 DIAGNOSIS — K8309 Other cholangitis: Secondary | ICD-10-CM | POA: Diagnosis not present

## 2023-04-13 LAB — CBC WITH DIFFERENTIAL/PLATELET
Abs Immature Granulocytes: 0.1 10*3/uL — ABNORMAL HIGH (ref 0.00–0.07)
Basophils Absolute: 0 10*3/uL (ref 0.0–0.1)
Basophils Relative: 0 %
Eosinophils Absolute: 0.6 10*3/uL — ABNORMAL HIGH (ref 0.0–0.5)
Eosinophils Relative: 6 %
HCT: 33.6 % — ABNORMAL LOW (ref 36.0–46.0)
Hemoglobin: 10.8 g/dL — ABNORMAL LOW (ref 12.0–15.0)
Immature Granulocytes: 1 %
Lymphocytes Relative: 17 %
Lymphs Abs: 1.7 10*3/uL (ref 0.7–4.0)
MCH: 32.6 pg (ref 26.0–34.0)
MCHC: 32.1 g/dL (ref 30.0–36.0)
MCV: 101.5 fL — ABNORMAL HIGH (ref 80.0–100.0)
Monocytes Absolute: 0.6 10*3/uL (ref 0.1–1.0)
Monocytes Relative: 6 %
Neutro Abs: 7 10*3/uL (ref 1.7–7.7)
Neutrophils Relative %: 70 %
Platelets: 276 10*3/uL (ref 150–400)
RBC: 3.31 MIL/uL — ABNORMAL LOW (ref 3.87–5.11)
RDW: 12.4 % (ref 11.5–15.5)
WBC: 10.1 10*3/uL (ref 4.0–10.5)
nRBC: 0 % (ref 0.0–0.2)

## 2023-04-13 LAB — BASIC METABOLIC PANEL WITH GFR
Anion gap: 12 (ref 5–15)
BUN: 13 mg/dL (ref 8–23)
CO2: 23 mmol/L (ref 22–32)
Calcium: 9.1 mg/dL (ref 8.9–10.3)
Chloride: 97 mmol/L — ABNORMAL LOW (ref 98–111)
Creatinine, Ser: 0.79 mg/dL (ref 0.44–1.00)
GFR, Estimated: >60 mL/min (ref >60)
Glucose, Bld: 122 mg/dL — ABNORMAL HIGH (ref 70–99)
Potassium: 3.9 mmol/L (ref 3.5–5.1)
Sodium: 132 mmol/L — ABNORMAL LOW (ref 135–145)

## 2023-04-13 LAB — HEPATIC FUNCTION PANEL
ALT: 37 U/L (ref 0–44)
AST: 35 U/L (ref 15–41)
Albumin: 2.6 g/dL — ABNORMAL LOW (ref 3.5–5.0)
Alkaline Phosphatase: 304 U/L — ABNORMAL HIGH (ref 38–126)
Bilirubin, Direct: 0.3 mg/dL — ABNORMAL HIGH (ref 0.0–0.2)
Indirect Bilirubin: 0.5 mg/dL (ref 0.3–0.9)
Total Bilirubin: 0.8 mg/dL (ref 0.0–1.2)
Total Protein: 6.6 g/dL (ref 6.5–8.1)

## 2023-04-13 MED ORDER — CEFADROXIL 500 MG PO CAPS: 1000.0000 mg | ORAL_CAPSULE | Freq: Two times a day (BID) | ORAL | 0 refills | Status: AC

## 2023-04-13 MED ORDER — TRAMADOL HCL 50 MG PO TABS: 50.0000 mg | ORAL_TABLET | Freq: Three times a day (TID) | ORAL | 0 refills | Status: AC

## 2023-04-13 MED ORDER — PAROXETINE HCL 30 MG PO TABS: 30.0000 mg | ORAL_TABLET | Freq: Every day | ORAL | 0 refills | Status: AC

## 2023-04-13 MED ORDER — CLONAZEPAM 0.5 MG PO TABS: 0.2500 mg | ORAL_TABLET | Freq: Every day | ORAL | 0 refills | Status: AC

## 2023-04-13 MED ORDER — METOPROLOL TARTRATE 25 MG PO TABS: 25.0000 mg | ORAL_TABLET | Freq: Every day | ORAL | Status: AC

## 2023-04-13 NOTE — NC FL2 (Signed)
 [place] MEDICAID FL2 LEVEL OF CARE FORM     IDENTIFICATION  Patient Name: [name] Birthdate: 1924-05-08 Sex: female Admission Date (Current Location): 04/06/2023  [facility] and [place] Number:  Producer, television/film/video and Address:  [facility], [address] [place], [place] [address]      Provider Number: [id number]  Attending Physician Name and Address:  Rhetta Mura, MD  Relative Name and Phone Number:  Natalia Leatherwood Daughter [phone]    Current Level of Care: Hospital Recommended Level of Care: Skilled Nursing Facility Prior Approval Number:    Date Approved/Denied:   PASRR Number: [id number]  Discharge Plan: SNF    Current Diagnoses: Patient Active Problem List   Diagnosis Date Noted   Bacteremia 04/08/2023   Elevated bilirubin 04/08/2023   Cholangitis 04/06/2023   Severe episode of recurrent major depressive disorder, without psychotic features (HCC) 10/16/2021   Primary oral squamous cell carcinoma (HCC) 09/17/2021   Hospice care patient 09/17/2021   Cancer of oral cavity (HCC) 09/09/2021   Diastolic dysfunction without heart failure 10/07/2020   Generalized weakness 07/10/2020   Fall at home, initial encounter 07/10/2020   COVID-19 virus infection 07/10/2020   Lower extremity edema 07/10/2020   Elevated troponin level not due myocardial infarction 07/10/2020   Hypertension    Anemia    Acute cystitis 07/09/2020   Stage 3 chronic kidney disease (HCC) 04/22/2020   Prediabetes 04/22/2020   Personal history of transient ischemic attack (TIA), and cerebral infarction without residual deficits 04/22/2020   Osteoarthritis 04/22/2020   Mixed hyperlipidemia 04/22/2020   Malnutrition (HCC) 04/22/2020   Major depression, single episode 04/22/2020   Edema 04/22/2020   Disorder of arteries and arterioles, unspecified (HCC) 04/22/2020   Dementia (HCC) 04/22/2020   Genetic testing 11/13/2018   Malignant neoplasm of  central portion of left breast in female, estrogen receptor positive (HCC) 10/26/2018   Osteopenia of lower leg 10/26/2018   Neck pain 05/05/2015   History of right common carotid artery stent placement 05/27/2012    Orientation RESPIRATION BLADDER Height & Weight     Self  Normal External catheter Weight: 147 lb (66.7 kg) Height:  5\' 6"  (167.6 cm)  BEHAVIORAL SYMPTOMS/MOOD NEUROLOGICAL BOWEL NUTRITION STATUS      Incontinent Diet (see discharge summary)  AMBULATORY STATUS COMMUNICATION OF NEEDS Skin   Limited Assist Verbally Normal                       Personal Care Assistance Level of Assistance  Bathing, Dressing Bathing Assistance: Limited assistance   Dressing Assistance: Limited assistance     Functional Limitations Info  Sight Sight Info:  (glasses)        SPECIAL CARE FACTORS FREQUENCY  PT (By licensed PT), OT (By licensed OT)     PT Frequency: per facility OT Frequency: per facility            Contractures      Additional Factors Info  Code Status, Allergies Code Status Info: DNR Allergies Info: Fioricet-codeine (butalbital-apap-caff-cod);  Fluogen (influenza Virus Vaccine); Guaifenesin-codeine; Penicillins; Sulfa Antibiotics ; Codeine; Prednisone; Sulfamethoxazole-trimethoprim           Current Medications (04/13/2023):  This is the current hospital active medication list Current Facility-Administered Medications  Medication Dose Route Frequency Provider Last Rate Last Admin   acetaminophen (TYLENOL) tablet 650 mg  650 mg Oral Q6H PRN Buena Irish, MD       Or   acetaminophen (  TYLENOL) suppository 650 mg  650 mg Rectal Q6H PRN Buena Irish, MD       aspirin EC tablet 81 mg  81 mg Oral Daily Buena Irish, MD   81 mg at 04/13/23 6578   benzocaine (ORAJEL) 10 % mucosal gel 1 Application  1 Application Mouth/Throat TID PRN Rhetta Mura, MD       cefadroxil (DURICEF) capsule 1,000 mg  1,000 mg Oral BID Rhetta Mura, MD   1,000 mg at 04/13/23 4696   clonazePAM (KLONOPIN) disintegrating tablet 0.25 mg  0.25 mg Oral QHS [place], Alvira Philips, DO   0.25 mg at 04/12/23 2213   diclofenac Sodium (VOLTAREN) 1 % topical gel 4 g  4 g Topical TID AC & HS Buena Irish, MD   4 g at 04/12/23 2214   feeding supplement (ENSURE ENLIVE / ENSURE PLUS) liquid 237 mL  237 mL Oral TID WC Adam Phenix, PA-C   237 mL at 04/13/23 0910   metoprolol tartrate (LOPRESSOR) tablet 25 mg  25 mg Oral Daily Buena Irish, MD   25 mg at 04/13/23 0909   ondansetron (ZOFRAN) tablet 4 mg  4 mg Oral Q6H PRN Buena Irish, MD       Or   ondansetron [facility]) injection 4 mg  4 mg Intravenous Q6H PRN Buena Irish, MD       pantoprazole (PROTONIX) EC tablet 40 mg  40 mg Oral Q0600 [place], Alvira Philips, DO   40 mg at 04/13/23 0601   PARoxetine (PAXIL) tablet 30 mg  30 mg Oral Daily Rhetta Mura, MD   30 mg at 04/13/23 2952   polyvinyl alcohol (LIQUIFILM TEARS) 1.4 % ophthalmic solution 1 drop  1 drop Both Eyes BID Rhetta Mura, MD   1 drop at 04/13/23 0910   traMADol (ULTRAM) tablet 50 mg  50 mg Oral TID PRN Buena Irish, MD   50 mg at 04/09/23 1655     Discharge Medications: Please see discharge summary for a list of discharge medications.  Relevant Imaging Results:  Relevant Lab Results:   Additional Information SSN [id number]  Marya Landry Liliana Brentlinger, LCSWA

## 2023-04-13 NOTE — TOC Transition Note (Addendum)
 Transition of Care [facility]) - Discharge Note   Patient Details  Name: [name] MRN: [id number] Date of Birth: 06-25-1924  Transition of Care [facility]) CM/SW Contact:  Donnalee Curry, LCSWA Phone Number: 04/13/2023, 10:45 AM   Clinical Narrative:     SW spoke with Belenda Cruise [facility] [phone]) confirmed able to admit today. Call Report: [phone] Room: 80  [facility] called  Final next level of care: Skilled Nursing Facility Barriers to Discharge: Barriers Resolved   Patient Goals and CMS Choice            Discharge Placement              Patient chooses bed at: [facility] Patient to be transferred to facility by: [facility] Name of family member notified: Daughter Patient and family notified of of transfer: 04/13/23  Discharge Plan and Services Additional resources added to the After Visit Summary for                                       Social Drivers of Health (SDOH) Interventions SDOH Screenings   Food Insecurity: No Food Insecurity (04/06/2023)  Housing: Low Risk  (04/06/2023)  Transportation Needs: No Transportation Needs (04/06/2023)  Utilities: Not At Risk (04/06/2023)  Social Connections: Unknown (04/06/2023)  Tobacco Use: Medium Risk (04/07/2023)     Readmission Risk Interventions     No data to display

## 2023-04-13 NOTE — Plan of Care (Signed)
  Problem: Elimination: Goal: Will not experience complications related to bowel motility Outcome: Progressing   Problem: Education: Goal: Knowledge of General Education information will improve Description: Including pain rating scale, medication(s)/side effects and non-pharmacologic comfort measures Outcome: Not Progressing   Problem: Health Behavior/Discharge Planning: Goal: Ability to manage health-related needs will improve Outcome: Not Progressing   Problem: Clinical Measurements: Goal: Ability to maintain clinical measurements within normal limits will improve Outcome: Not Progressing

## 2023-04-13 NOTE — Discharge Summary (Signed)
 Physician Discharge Summary  [name] WGN:[id number] DOB: [birth year] DOA: 04/06/2023  PCP: Ellan Lambert, NP  Admit date: 04/06/2023 Discharge date: 04/13/2023  Time spent: 44 minutes  Recommendations for Outpatient Follow-up:  Complete cefadroxil antibiotics going forward for treatment of probable CBD/biliary source to sepsis Note dosage change and increase of metoprolol and discontinuation of Lasix this hospital stay Requires labs periodically in about 1 week Hospice will follow in the outpatient setting Refilled Paxil tramadol and Ativan as per prior meds-minimize as best possible  Discharge Diagnoses:  MAIN problem for hospitalization   Sepsis on admission secondary to E. coli probably from gallbladder source Hospice patient  Please see below for itemized issues addressed in HOpsital- refer to other progress notes for clarity if needed  Discharge Condition: Fair but overall guarded  Diet recommendation: Would liberalize to regular  Filed Weights   04/06/23 1800  Weight: 66.7 kg    History of present illness:  89 year old white female wheelchair-bound at baseline ?  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 Melinda Crutch at [facility] a candidate for chemo immunotherapy Prior TIA HTN HLD Arthritis right knee 3/23 admission from skilled facility with cyanosis Tmax 102 diarrhea heart rate 130 BP 01/31/1983 Sodium 132 BUN/creatinine 25/1.6 WBC 18 hemoglobin 11 platelet 354 RVP negative hepatitis serology negative  blood culture grew E. coli [place] ABD gallbladder sludge small stone, HIDA persistent radiotracer activity throughout liver   3/24 General Surgery consulted, GI consulted and felt conservative approach better-diet resumed 3/25 MRCP no choledocholithiasis CBD evaluation limited by motion artifact gallbladder distended with internal sludge no wall thickening     Plan   Sepsis on admission with lactic acidosis  on admission E. coli growing on admission blood culture Possibility of bili obstruction transaminitis Patient and family have elected to not undergo ERCP/operative management unless clinically worsened-LFTs have improved, patient afebrile After discussion with Dr. 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-no change in management-will be followed by hospice as an outpatient She has been cleared for dysphagia 1 diet tolerating well-follow-up precautions   HTN Continues metoprolol 25 daily--slightly elevated pressures-would hold Lasix 20 daily going forward unless absolutely necessary   Arthritis First choice Tylenol-does have tramadol 50 3 times daily as needed on board but would try to minimize unless absolutely necessary   Discussed with daughter at the bedside on several days she clarifies that she wants patient to go back to facility and that patient will be followed by hospice-as such no further aggressive measures probably just labs within the next week or so and completion of oral antibiotics    Discharge Exam: Vitals:   04/13/23 0000 04/13/23 0554  BP:  (!) 150/81  Pulse:  81  Resp:    Temp:  98.2 F (36.8 C)  SpO2: 93% 93%    Subj on day of d/c   Awake coherent no distress has not eaten breakfast this morning  General Exam on discharge  EOMI NCAT no focal deficit no icterus no pallor Kyphotic S1-S2 no murmur No  RUQ tenderness No lower extremity edema Psych euthymic coherent and menance   Discharge Instructions   Discharge Instructions     Diet - low sodium heart healthy   Complete by: As directed    Increase activity slowly   Complete by: As directed       Allergies as of 04/13/2023       Reactions   Fioricet-codeine [butalbital-apap-caff-cod] Other (See Comments)   unknown   Fluogen [influenza Virus Vaccine]    Other reaction(s): didn't tolerate the high dose shot   Guaifenesin-codeine Other (See Comments)   unknown   Penicillins    Sulfa Antibiotics    Codeine Other (See Comments)   Codeine phosphate, sulfate, and guaifenesin   Penicillin G Rash   Prednisone Palpitations   Sulfamethoxazole-trimethoprim Rash        Medication List     STOP taking these medications    benzocaine 10 % mucosal gel Commonly known as: ORAJEL   diphenhydrAMINE 12.5 MG/5ML liquid Commonly known as: BENADRYL   furosemide 20 MG tablet Commonly known as: LASIX   SM Saline Solution Soln       TAKE these medications    acetaminophen 325 MG tablet Commonly known as: TYLENOL Take 650 mg by mouth 3 (three) times daily.   aspirin 81 MG tablet Take 81 mg by mouth daily.   camphor-menthol lotion Commonly known as: SARNA Apply 1 Application topically 2 (two) times daily as needed for itching. Apply to upper and lower body as needed   cefadroxil 500 MG capsule Commonly known as: DURICEF Take 2 capsules (1,000 mg total) by mouth 2 (two) times daily.   clonazePAM 0.5 MG tablet Commonly known as: KLONOPIN Take 0.5 tablets (0.25 mg total) by mouth at bedtime.   diclofenac Sodium 1 % Gel Commonly known as: VOLTAREN Apply 4 g topically 4 (four) times daily. Apply 4 grams topically to right knee for pain; monitor for nausea   metoprolol tartrate 25 MG tablet Commonly known as: LOPRESSOR Take 1 tablet (25 mg total) by mouth daily. What changed:  how much to take additional  instructions   pantoprazole 40 MG tablet Commonly known as: PROTONIX Take 1 tablet (40 mg total) by mouth daily at 6 (six) AM.   PARoxetine 30 MG tablet Commonly known as: PAXIL Take 1 tablet (30 mg total) by mouth daily.   Refresh Tears 0.5 % Soln Generic drug: carboxymethylcellulose Place 1 drop into both eyes 2 (two) times daily.   SALINE MIST 0.65 % nasal spray Generic drug: sodium chloride Place 1 spray into the nose daily.   traMADol 50 MG tablet Commonly known as: ULTRAM Take 1 tablet (50 mg total) by mouth 3 (three) times daily.   Vagisil 5-2 % Crea Apply 1 Application topically at bedtime.       Allergies  Allergen Reactions   Fioricet-Codeine [Butalbital-Apap-Caff-Cod] Other (See Comments)    unknown   Fluogen [Influenza Virus Vaccine]     Other reaction(s): didn't tolerate the high dose shot   Guaifenesin-Codeine Other (See Comments)    unknown   Penicillins    Sulfa Antibiotics    Codeine Other (See Comments)    Codeine phosphate, sulfate, and guaifenesin   Penicillin G Rash   Prednisone Palpitations   Sulfamethoxazole-Trimethoprim Rash    Contact information for after-discharge care     Destination     [facility] Preferred [facility] .   Service: Skilled Nursing Contact information: [address] [place] [address] [phone]                      The results of significant diagnostics from this hospitalization (including imaging, microbiology, ancillary and laboratory) are listed below for reference.    Significant Diagnostic Studies: MR ABDOMEN MRCP W WO CONTAST Result Date: 04/08/2023 CLINICAL DATA:  Cholelithiasis. Concern for choledocholithiasis and biliary obstruction. No biliary excretion on recent hepatobiliary scan. EXAM: MRI ABDOMEN WITHOUT AND WITH CONTRAST (INCLUDING MRCP) TECHNIQUE: Multiplanar multisequence MR imaging of the abdomen was performed both before and  after the administration of intravenous contrast. Heavily T2-weighted images of the biliary and pancreatic ducts were obtained, and three-dimensional MRCP images were rendered by post processing. CONTRAST:  6mL GADAVIST GADOBUTROL 1 MMOL/ML IV SOLN COMPARISON:  Nuclear medicine hepatobiliary scan 04/07/2023. Abdominal ultrasound and abdominal CT 04/06/2023. FINDINGS: Technical note: Despite efforts by the technologist and patient, mild to moderate motion artifact is present on today's exam and could not be eliminated. This reduces exam sensitivity and specificity. Lower chest: Images through the lower chest demonstrate asymmetric elevation the right hemidiaphragm with small bilateral pleural effusions and bibasilar pulmonary atelectasis. Hepatobiliary: The liver has a non cirrhotic morphology. There is borderline signal loss on the gradient echo opposed phase images consistent with possible mild steatosis. No focal liver lesion or abnormal enhancement identified following contrast. As on recent prior imaging, the gallbladder is distended with internal sludge, but no significant wall thickening or surrounding inflammation. There is mild intra and extrahepatic biliary dilatation. The common hepatic duct measures 10 mm in diameter. No definite evidence of choledocholithiasis, although evaluation limited by motion. Pancreas: Mild generalized atrophy. No focal abnormality or ductal dilatation identified. Spleen: Normal in size without focal abnormality. Adrenals/Urinary Tract: Both adrenal glands appear normal. No evidence of renal mass or hydronephrosis. Small renal cysts are noted for which no specific follow-up imaging is recommended. Stomach/Bowel: The stomach appears unremarkable for its degree of distension. No evidence of bowel wall thickening, distention or surrounding inflammatory change. Vascular/Lymphatic: No enlarged abdominal lymph nodes are identified. There is severe aortic and branch vessel  atherosclerosis with associated intimal irregularity. No focal aneurysm or large vessel occlusion identified. Other: Mild dependent subcutaneous edema in the back. No evidence of significant ascites or focal extraluminal fluid collection. Musculoskeletal: No acute or significant osseous findings. Multilevel spondylosis associated with a convex right thoracolumbar scoliosis. IMPRESSION: 1. Mild intra and extrahepatic biliary dilatation without definite evidence of choledocholithiasis. Evaluation of the common bile duct limited by motion artifact. Consider ERCP if clinically warranted. 2. Distended gallbladder with internal sludge, but no significant wall thickening or surrounding inflammation. 3. No other acute abdominal findings. 4. Small bilateral pleural effusions and bibasilar pulmonary atelectasis. 5. Severe aortic and branch vessel atherosclerosis. Electronically Signed   By: Carey Bullocks M.D.   On: 04/08/2023 14:26   MR 3D Recon At Scanner Result Date: 04/08/2023 CLINICAL DATA:  Cholelithiasis. Concern for choledocholithiasis and biliary obstruction. No biliary excretion on recent hepatobiliary scan. EXAM: MRI ABDOMEN WITHOUT AND WITH CONTRAST (INCLUDING MRCP) TECHNIQUE: Multiplanar multisequence MR imaging of the abdomen was performed both before and after the administration of intravenous contrast. Heavily T2-weighted images of the biliary and pancreatic ducts were obtained, and three-dimensional MRCP images were rendered by post processing. CONTRAST:  6mL GADAVIST GADOBUTROL 1 MMOL/ML IV SOLN COMPARISON:  Nuclear medicine hepatobiliary scan 04/07/2023.  Abdominal ultrasound and abdominal CT 04/06/2023. FINDINGS: Technical note: Despite efforts by the technologist and patient, mild to moderate motion artifact is present on today's exam and could not be eliminated. This reduces exam sensitivity and specificity. Lower chest: Images through the lower chest demonstrate asymmetric elevation the right  hemidiaphragm with small bilateral pleural effusions and bibasilar pulmonary atelectasis. Hepatobiliary: The liver has a non cirrhotic morphology. There is borderline signal loss on the gradient echo opposed phase images consistent with possible mild steatosis. No focal liver lesion or abnormal enhancement identified following contrast. As on recent prior imaging, the gallbladder is distended with internal sludge, but no significant wall thickening or surrounding inflammation. There is mild intra and extrahepatic biliary dilatation. The common hepatic duct measures 10 mm in diameter. No definite evidence of choledocholithiasis, although evaluation limited by motion. Pancreas: Mild generalized atrophy. No focal abnormality or ductal dilatation identified. Spleen: Normal in size without focal abnormality. Adrenals/Urinary Tract: Both adrenal glands appear normal. No evidence of renal mass or hydronephrosis. Small renal cysts are noted for which no specific follow-up imaging is recommended. Stomach/Bowel: The stomach appears unremarkable for its degree of distension. No evidence of bowel wall thickening, distention or surrounding inflammatory change. Vascular/Lymphatic: No enlarged abdominal lymph nodes are identified. There is severe aortic and branch vessel atherosclerosis with associated intimal irregularity. No focal aneurysm or large vessel occlusion identified. Other: Mild dependent subcutaneous edema in the back. No evidence of significant ascites or focal extraluminal fluid collection. Musculoskeletal: No acute or significant osseous findings. Multilevel spondylosis associated with a convex right thoracolumbar scoliosis. IMPRESSION: 1. Mild intra and extrahepatic biliary dilatation without definite evidence of choledocholithiasis. Evaluation of the common bile duct limited by motion artifact. Consider ERCP if clinically warranted. 2. Distended gallbladder with internal sludge, but no significant wall thickening  or surrounding inflammation. 3. No other acute abdominal findings. 4. Small bilateral pleural effusions and bibasilar pulmonary atelectasis. 5. Severe aortic and branch vessel atherosclerosis. Electronically Signed   By: Carey Bullocks M.D.   On: 04/08/2023 14:26   ECHOCARDIOGRAM COMPLETE Result Date: 04/07/2023    ECHOCARDIOGRAM REPORT   Patient Name:   [name] Date of Exam: 04/07/2023 Medical Rec #:  [id number]        Height:       66.0 in Accession #:    [id number]       Weight:       147.0 lb Date of Birth:  September 25, 1924       BSA:          1.755 m Patient Age:    89 years         BP:           122/65 mmHg Patient Gender: F                HR:           58 bpm. Exam Location:  Inpatient Procedure: 2D Echo, Cardiac Doppler and Color Doppler (Both Spectral and Color            Flow Doppler were utilized during procedure). Indications:    I50.40* Unspecified combined systolic (congestive) and diastolic                 (congestive) heart failure  History:        Patient has prior history of Echocardiogram examinations, most                 recent 07/10/2020. Signs/Symptoms:Edema, Altered Mental Status  and Alzheimer's; Risk Factors:Hypertension and Dyslipidemia.                 Cancer.  Sonographer:    Sheralyn Boatman RDCS Referring Phys: [id number] ERIC J [place] IMPRESSIONS  1. Left ventricular ejection fraction, by estimation, is 60 to 65%. The left ventricle has normal function. The left ventricle has no regional wall motion abnormalities. There is mild concentric left ventricular hypertrophy. Left ventricular diastolic parameters are consistent with Grade II diastolic dysfunction (pseudonormalization).  2. Right ventricular systolic function is normal. The right ventricular size is normal. There is mildly elevated pulmonary artery systolic pressure. The estimated right ventricular systolic pressure is 41.1 mmHg.  3. Left atrial size was severely dilated.  4. Right atrial size was mildly dilated.   5. The mitral valve is normal in structure. Moderate mitral valve regurgitation. No evidence of mitral stenosis. Moderate mitral annular calcification.  6. Tricuspid valve regurgitation is moderate.  7. The aortic valve is tricuspid. There is mild calcification of the aortic valve. Aortic valve regurgitation is mild. Aortic valve sclerosis/calcification is present, without any evidence of aortic stenosis.  8. Aortic dilatation noted. There is borderline dilatation of the ascending aorta, measuring 38 mm. FINDINGS  Left Ventricle: Left ventricular ejection fraction, by estimation, is 60 to 65%. The left ventricle has normal function. The left ventricle has no regional wall motion abnormalities. The left ventricular internal cavity size was normal in size. There is  mild concentric left ventricular hypertrophy. Left ventricular diastolic parameters are consistent with Grade II diastolic dysfunction (pseudonormalization). Right Ventricle: The right ventricular size is normal. No increase in right ventricular wall thickness. Right ventricular systolic function is normal. There is mildly elevated pulmonary artery systolic pressure. The tricuspid regurgitant velocity is 2.79  m/s, and with an assumed right atrial pressure of 10 mmHg, the estimated right ventricular systolic pressure is 41.1 mmHg. Left Atrium: Left atrial size was severely dilated. Right Atrium: Right atrial size was mildly dilated. Pericardium: There is no evidence of pericardial effusion. Mitral Valve: The mitral valve is normal in structure. Moderate mitral annular calcification. Moderate mitral valve regurgitation, with centrally-directed jet. No evidence of mitral valve stenosis. MV peak gradient, 7.6 mmHg. The mean mitral valve gradient is 3.0 mmHg. Tricuspid Valve: The tricuspid valve is normal in structure. Tricuspid valve regurgitation is moderate . No evidence of tricuspid stenosis. Aortic Valve: The aortic valve is tricuspid. There is mild  calcification of the aortic valve. Aortic valve regurgitation is mild. Aortic valve sclerosis/calcification is present, without any evidence of aortic stenosis. Pulmonic Valve: The pulmonic valve was normal in structure. Pulmonic valve regurgitation is not visualized. No evidence of pulmonic stenosis. Aorta: Aortic dilatation noted. There is borderline dilatation of the ascending aorta, measuring 38 mm. IAS/Shunts: No atrial level shunt detected by color flow Doppler.  LEFT VENTRICLE PLAX 2D LVIDd:         5.10 cm     Diastology LVIDs:         3.10 cm     LV e' medial:    5.66 cm/s LV PW:         1.20 cm     LV E/e' medial:  23.1 LV IVS:        1.10 cm     LV e' lateral:   10.40 cm/s LVOT diam:     2.30 cm     LV E/e' lateral: 12.6 LV SV:         107 LV SV Index:  61 LVOT Area:     4.15 cm  LV Volumes (MOD) LV vol d, MOD A2C: 67.8 ml LV vol d, MOD A4C: 70.6 ml LV vol s, MOD A2C: 26.0 ml LV vol s, MOD A4C: 25.6 ml LV SV MOD A2C:     41.8 ml LV SV MOD A4C:     70.6 ml LV SV MOD BP:      45.2 ml RIGHT VENTRICLE             IVC RV S prime:     12.20 cm/s  IVC diam: 2.20 cm TAPSE (M-mode): 1.4 cm LEFT ATRIUM           Index        RIGHT ATRIUM           Index LA diam:      3.40 cm 1.94 cm/m   RA Area:     13.40 cm LA Vol (A2C): 24.9 ml 14.19 ml/m  RA Volume:   35.40 ml  20.18 ml/m LA Vol (A4C): 62.1 ml 35.39 ml/m  AORTIC VALVE LVOT Vmax:   132.00 cm/s LVOT Vmean:  86.500 cm/s LVOT VTI:    0.257 m  AORTA Ao Root diam: 3.10 cm Ao Asc diam:  3.80 cm MITRAL VALVE                TRICUSPID VALVE MV Area (PHT): 4.30 cm     TR Peak grad:   31.1 mmHg MV Area VTI:   2.47 cm     TR Vmax:        279.00 cm/s MV Peak grad:  7.6 mmHg MV Mean grad:  3.0 mmHg     SHUNTS MV Vmax:       1.38 m/s     Systemic VTI:  0.26 m MV Vmean:      82.0 cm/s    Systemic Diam: 2.30 cm MV Decel Time: 177 msec MV E velocity: 131.00 cm/s MV A velocity: 125.50 cm/s MV E/A ratio:  1.04 Arvilla Meres MD Electronically signed by Arvilla Meres MD  Signature Date/Time: 04/07/2023/1:55:02 PM    Final    NM Hepatobiliary Liver Func Result Date: 04/07/2023 CLINICAL DATA:  Gallstones.  Evaluate for acute cholecystitis. EXAM: NUCLEAR MEDICINE HEPATOBILIARY IMAGING TECHNIQUE: Sequential images of the abdomen were obtained out to 60 minutes following intravenous administration of radiopharmaceutical. RADIOPHARMACEUTICALS:  5.0 mCi Tc-18m  Choletec IV COMPARISON:  Abdominal sonogram from 04/08/2023 FINDINGS: Prompt uptake of activity by the liver is seen. After 60 minutes of imaging there was persistent radiotracer uptake throughout both lobes of the liver without signs of biliary or bowel activity.Patient was reimaged at 120 minutes with similar degree of diffuse uptake throughout the liver without biliary or bowel activity. IMPRESSION: 1. There is persistent radiotracer activity noted throughout the liver after 120 minutes of imaging without biliary or bowel activity. These findings may be seen in the setting of hepatic site dysfunction as well as common bile duct obstruction. Electronically Signed   By: Signa Kell M.D.   On: 04/07/2023 11:23   US Abdomen Limited RUQ (LIVER/GB) Result Date: 04/06/2023 CLINICAL DATA:  Elevated LFTs EXAM: ULTRASOUND ABDOMEN LIMITED RIGHT UPPER QUADRANT COMPARISON:  CT from earlier in the same day. FINDINGS: Gallbladder: Gallbladder is well distended. Gallbladder sludge is noted within. A few small mobile stones are seen. No wall thickening or pericholecystic fluid is noted. Negative sonographic Murphy's sign is elicited. Common bile duct: Diameter: 6 mm. Liver: No focal lesion identified. Within  normal limits in parenchymal echogenicity. Portal vein is patent on color Doppler imaging with normal direction of blood flow towards the liver. Other: None. IMPRESSION: Gallbladder sludge and small stones. Electronically Signed   By: Alcide Clever M.D.   On: 04/06/2023 21:18   CT CHEST ABDOMEN PELVIS WO CONTRAST Result Date:  04/06/2023 CLINICAL DATA:  Peripheral cyanosis and diarrhea with abdominal pain EXAM: CT CHEST, ABDOMEN AND PELVIS WITHOUT CONTRAST TECHNIQUE: Multidetector CT imaging of the chest, abdomen and pelvis was performed following the standard protocol without IV contrast. RADIATION DOSE REDUCTION: This exam was performed according to the departmental dose-optimization program which includes automated exposure control, adjustment of the mA and/or kV according to patient size and/or use of iterative reconstruction technique. COMPARISON:  07/10/2020 FINDINGS: CT CHEST FINDINGS Cardiovascular: Limited due to the lack of IV contrast. Atherosclerotic calcifications are seen. No aneurysmal dilatation is noted. Extensive calcification of the mitral annulus is noted. No cardiac enlargement is seen. Heavy coronary calcifications are noted as well. Mediastinum/Nodes: Thoracic inlet is within normal limits. No hilar or mediastinal adenopathy is noted. The esophagus as visualized is within normal limits. Lungs/Pleura: Lungs are well aerated bilaterally. Basilar atelectatic changes are seen. A small 3 mm nodule is noted in the right middle lobe laterally not well appreciated on the prior exam. No further follow-up is recommended. No other significant nodule is noted. Musculoskeletal: Degenerative changes of the thoracic spine are noted. Chronic T5 compression fracture is noted. Old healed manubrial fracture is seen. No definitive rib abnormality is noted. CT ABDOMEN PELVIS FINDINGS Hepatobiliary: No focal liver abnormality is seen. No gallstones, gallbladder wall thickening, or biliary dilatation. Pancreas: Unremarkable. No pancreatic ductal dilatation or surrounding inflammatory changes. Spleen: Normal in size without focal abnormality. Adrenals/Urinary Tract: Adrenal glands are within normal limits. Kidneys show no renal calculi or obstructive changes. A few small exophytic cysts are noted arising from the kidneys. No follow-up is  recommended. The bladder is well distended. Stomach/Bowel: Scattered diverticular change of the colon is noted without evidence of diverticulitis. The appendix is air-filled and within normal limits. Small bowel and stomach are unremarkable. Vascular/Lymphatic: Aortic atherosclerosis. No enlarged abdominal or pelvic lymph nodes. Reproductive: Uterus and bilateral adnexa are unremarkable. Other: No abdominal wall hernia or abnormality. No abdominopelvic ascites. Musculoskeletal: Degenerative changes of the right hip joint are noted with remodeling of the femoral head. No acute fracture is noted. Chronic L2 compression fracture is noted. IMPRESSION: Somewhat limited exam due to lack of IV contrast. Single nodule in the right middle lobe. No follow-up is recommended based on the size and patient's given age. Diverticular change without diverticulitis. Chronic bony changes as described. Electronically Signed   By: Alcide Clever M.D.   On: 04/06/2023 19:51    Microbiology: Recent Results (from the past 240 hours)  Gastrointestinal Panel by PCR , Stool     Status: None   Collection Time: 04/06/23  4:00 PM   Specimen: Stool  Result Value Ref Range Status   Campylobacter species NOT DETECTED NOT DETECTED Final   Plesimonas shigelloides NOT DETECTED NOT DETECTED Final   Salmonella species NOT DETECTED NOT DETECTED Final   Yersinia enterocolitica NOT DETECTED NOT DETECTED Final   Vibrio species NOT DETECTED NOT DETECTED Final   Vibrio cholerae NOT DETECTED NOT DETECTED Final   Enteroaggregative E coli (EAEC) NOT DETECTED NOT DETECTED Final   Enteropathogenic E coli (EPEC) NOT DETECTED NOT DETECTED Final   Enterotoxigenic E coli (ETEC) NOT DETECTED NOT DETECTED Final  Shiga like toxin producing E coli (STEC) NOT DETECTED NOT DETECTED Final   Shigella/Enteroinvasive E coli (EIEC) NOT DETECTED NOT DETECTED Final   Cryptosporidium NOT DETECTED NOT DETECTED Final   Cyclospora cayetanensis NOT DETECTED NOT  DETECTED Final   Entamoeba histolytica NOT DETECTED NOT DETECTED Final   Giardia lamblia NOT DETECTED NOT DETECTED Final   Adenovirus F40/41 NOT DETECTED NOT DETECTED Final   Astrovirus NOT DETECTED NOT DETECTED Final   Norovirus GI/GII NOT DETECTED NOT DETECTED Final   Rotavirus A NOT DETECTED NOT DETECTED Final   Sapovirus (I, II, IV, and V) NOT DETECTED NOT DETECTED Final    Comment: Performed at [facility], [address] [place], [place] [address]  Blood culture (routine x 2)     Status: Abnormal   Collection Time: 04/06/23  5:00 PM   Specimen: BLOOD  Result Value Ref Range Status   Specimen Description BLOOD RIGHT ANTECUBITAL  Final   Special Requests   Final    BOTTLES DRAWN AEROBIC AND ANAEROBIC Blood Culture results may not be optimal due to an inadequate volume of blood received in culture bottles   Culture  Setup Time   Final    GRAM NEGATIVE RODS IN BOTH AEROBIC AND ANAEROBIC BOTTLES CRITICAL RESULT CALLED TO, READ BACK BY AND VERIFIED WITH: EMILY SINCLAIR ON 04/07/23 @ 1000 BY DV Performed at [facility], [address] [place], [place] [address]    Culture ESCHERICHIA COLI (A)  Final   Report Status 04/09/2023 FINAL  Final   Organism ID, Bacteria ESCHERICHIA COLI  Final   Organism ID, Bacteria ESCHERICHIA COLI  Final      Susceptibility   Escherichia coli - KIRBY BAUER*    CEFAZOLIN SENSITIVE Sensitive    Escherichia coli - MIC*    AMPICILLIN <=2 SENSITIVE Sensitive     CEFEPIME <=0.12 SENSITIVE Sensitive     CEFTAZIDIME <=1 SENSITIVE Sensitive     CEFTRIAXONE <=0.25 SENSITIVE Sensitive     CIPROFLOXACIN >=4 RESISTANT Resistant     GENTAMICIN <=1 SENSITIVE Sensitive     IMIPENEM <=0.25 SENSITIVE Sensitive     TRIMETH/SULFA <=20 SENSITIVE Sensitive     AMPICILLIN/SULBACTAM <=2 SENSITIVE Sensitive     PIP/TAZO <=4 SENSITIVE Sensitive ug/mL    * ESCHERICHIA COLI    ESCHERICHIA COLI  Blood Culture ID Panel (Reflexed)     Status: Abnormal    Collection Time: 04/06/23  5:00 PM  Result Value Ref Range Status   Enterococcus faecalis NOT DETECTED NOT DETECTED Final   Enterococcus Faecium NOT DETECTED NOT DETECTED Final   Listeria monocytogenes NOT DETECTED NOT DETECTED Final   Staphylococcus species NOT DETECTED NOT DETECTED Final   Staphylococcus aureus (BCID) NOT DETECTED NOT DETECTED Final   Staphylococcus epidermidis NOT DETECTED NOT DETECTED Final   Staphylococcus lugdunensis NOT DETECTED NOT DETECTED Final   Streptococcus species NOT DETECTED NOT DETECTED Final   Streptococcus agalactiae NOT DETECTED NOT DETECTED Final   Streptococcus pneumoniae NOT DETECTED NOT DETECTED Final   Streptococcus pyogenes NOT DETECTED NOT DETECTED Final   A.calcoaceticus-baumannii NOT DETECTED NOT DETECTED Final   Bacteroides fragilis NOT DETECTED NOT DETECTED Final   Enterobacterales DETECTED (A) NOT DETECTED Final    Comment: Enterobacterales represent a large order of gram negative bacteria, not a single organism. CRITICAL RESULT CALLED TO, READ BACK BY AND VERIFIED WITH: E. SINCLAIR PHARMD, AT 1007 04/07/23 D. VANHOOK    Enterobacter cloacae complex NOT DETECTED NOT DETECTED Final   Escherichia  coli DETECTED (A) NOT DETECTED Final    Comment: CRITICAL RESULT CALLED TO, READ BACK BY AND VERIFIED WITH: E. SINCLAIR PHARMD, AT 1007 04/07/23 D. VANHOOK    Klebsiella aerogenes NOT DETECTED NOT DETECTED Final   Klebsiella oxytoca NOT DETECTED NOT DETECTED Final   Klebsiella pneumoniae NOT DETECTED NOT DETECTED Final   Proteus species NOT DETECTED NOT DETECTED Final   Salmonella species NOT DETECTED NOT DETECTED Final   Serratia marcescens NOT DETECTED NOT DETECTED Final   Haemophilus influenzae NOT DETECTED NOT DETECTED Final   Neisseria meningitidis NOT DETECTED NOT DETECTED Final   Pseudomonas aeruginosa NOT DETECTED NOT DETECTED Final   Stenotrophomonas maltophilia NOT DETECTED NOT DETECTED Final   Candida albicans NOT DETECTED NOT  DETECTED Final   Candida auris NOT DETECTED NOT DETECTED Final   Candida glabrata NOT DETECTED NOT DETECTED Final   Candida krusei NOT DETECTED NOT DETECTED Final   Candida parapsilosis NOT DETECTED NOT DETECTED Final   Candida tropicalis NOT DETECTED NOT DETECTED Final   Cryptococcus neoformans/gattii NOT DETECTED NOT DETECTED Final   CTX-M ESBL NOT DETECTED NOT DETECTED Final   Carbapenem resistance IMP NOT DETECTED NOT DETECTED Final   Carbapenem resistance KPC NOT DETECTED NOT DETECTED Final   Carbapenem resistance NDM NOT DETECTED NOT DETECTED Final   Carbapenem resist OXA 48 LIKE NOT DETECTED NOT DETECTED Final   Carbapenem resistance VIM NOT DETECTED NOT DETECTED Final    Comment: Performed at [facility], [address] [place], [place] [address]  Blood culture (routine x 2)     Status: Abnormal   Collection Time: 04/06/23  5:15 PM   Specimen: BLOOD  Result Value Ref Range Status   Specimen Description BLOOD LEFT ANTECUBITAL  Final   Special Requests   Final    BOTTLES DRAWN AEROBIC AND ANAEROBIC Blood Culture results may not be optimal due to an inadequate volume of blood received in culture bottles   Culture  Setup Time   Final    GRAM NEGATIVE RODS IN BOTH AEROBIC AND ANAEROBIC BOTTLES CRITICAL VALUE NOTED.  VALUE IS CONSISTENT WITH PREVIOUSLY REPORTED AND CALLED VALUE.    Culture (A)  Final    ESCHERICHIA COLI SUSCEPTIBILITIES PERFORMED ON PREVIOUS CULTURE WITHIN THE LAST 5 DAYS. Performed at [facility], [address], [place], [place] [address]    Report Status 04/09/2023 FINAL  Final  Resp panel by RT-PCR (RSV, Flu A&B, Covid) Anterior Nasal Swab     Status: None   Collection Time: 04/06/23  6:14 PM   Specimen: Anterior Nasal Swab  Result Value Ref Range Status   SARS Coronavirus 2 by RT PCR NEGATIVE NEGATIVE Final   Influenza A by PCR NEGATIVE NEGATIVE Final   Influenza B by PCR NEGATIVE NEGATIVE Final    Comment: (NOTE) The Xpert Xpress  SARS-CoV-2/FLU/RSV plus assay is intended as an aid in the diagnosis of influenza from Nasopharyngeal swab specimens and should not be used as a sole basis for treatment. Nasal washings and aspirates are unacceptable for Xpert Xpress SARS-CoV-2/FLU/RSV testing.  Fact Sheet for Patients: BloggerCourse.com  Fact Sheet for Healthcare Providers: SeriousBroker.it  This test is not yet approved or cleared by the [place] FDA and has been authorized for detection and/or diagnosis of SARS-CoV-2 by FDA under an Emergency Use Authorization (EUA). This EUA will remain in effect (meaning this test can be used) for the duration of the COVID-19 declaration under Section 564(b)(1) of the Act, 21 U.S.C. section 360bbb-3(b)(1),  unless the authorization is terminated or revoked.     Resp Syncytial Virus by PCR NEGATIVE NEGATIVE Final    Comment: (NOTE) Fact Sheet for Patients: BloggerCourse.com  Fact Sheet for Healthcare Providers: SeriousBroker.it  This test is not yet approved or cleared by the [place] FDA and has been authorized for detection and/or diagnosis of SARS-CoV-2 by FDA under an Emergency Use Authorization (EUA). This EUA will remain in effect (meaning this test can be used) for the duration of the COVID-19 declaration under Section 564(b)(1) of the Act, 21 U.S.C. section 360bbb-3(b)(1), unless the authorization is terminated or revoked.  Performed at [facility], [address], [place], [place] [address]      Labs: Basic Metabolic Panel: Recent Labs  Lab 04/08/23 [phone] 04/09/23 0656 04/10/23 0623 04/11/23 0535 04/12/23 0607  NA 135 138 133* 134* 135  K 3.8 3.9 3.7 3.7 3.7  CL 106 106 102 101 101  CO2 20* 22 20* 20* 25  GLUCOSE 80 89 116* 107* 103*  BUN 17 15 17 20 16   CREATININE 1.02* 0.98 0.84 0.84 0.75  CALCIUM 9.0 9.2 9.0 8.7* 9.0   Liver  Function Tests: Recent Labs  Lab 04/09/23 0656 04/10/23 0623 04/10/23 1500 04/11/23 0535 04/12/23 0607  AST 73* 101* 84* 47* 34  ALT 98* 86* 80* 59* 45*  ALKPHOS 342* 492* 480* 405* 319*  BILITOT 1.6* 1.8* 1.3* 0.9 0.9  PROT 6.9 6.4* 7.1 6.6 6.5  ALBUMIN 2.6* 2.4* 2.6* 2.5* 2.5*   No results for input(s): "LIPASE", "AMYLASE" in the last 168 hours. No results for input(s): "AMMONIA" in the last 168 hours. CBC: Recent Labs  Lab 04/06/23 1715 04/07/23 1119 04/09/23 0656 04/10/23 0623 04/11/23 0535 04/12/23 0607 04/13/23 0732  WBC 18.5*   < > 10.1 9.1 9.1 8.7 10.1  NEUTROABS 17.3*  --   --  6.2 6.2 5.7 7.0  HGB 11.1*   < > 10.5* 10.0* 10.3* 10.0* 10.8*  HCT 34.9*   < > 32.4* 29.7* 31.6* 30.5* 33.6*  MCV 103.9*   < > 101.3* 99.3 100.3* 100.3* 101.5*  PLT 354   < > 296 275 278 287 276   < > = values in this interval not displayed.   Cardiac Enzymes: No results for input(s): "CKTOTAL", "CKMB", "CKMBINDEX", "TROPONINI" in the last 168 hours. BNP: BNP (last 3 results) No results for input(s): "BNP" in the last 8760 hours.  ProBNP (last 3 results) No results for input(s): "PROBNP" in the last 8760 hours.  CBG: No results for input(s): "GLUCAP" in the last 168 hours.  Signed:  Rhetta Mura MD   [facility] 04/13/2023, 8:23 AM

## 2023-05-21 ENCOUNTER — Encounter: Payer: Self-pay | Admitting: Oncology

## 2023-05-30 ENCOUNTER — Encounter: Payer: Self-pay | Admitting: Oncology

## 2023-06-13 ENCOUNTER — Encounter: Payer: Self-pay | Admitting: Oncology

## 2023-07-15 DEATH — deceased
